# Patient Record
Sex: Female | Born: 1935 | Race: White | Hispanic: No | Smoking: Former smoker
Health system: Southern US, Community
[De-identification: ages and names within clinical notes are randomized; demographics above are authoritative.]

## PROBLEM LIST (undated history)

## (undated) DIAGNOSIS — J45909 Unspecified asthma, uncomplicated: Secondary | ICD-10-CM

## (undated) DIAGNOSIS — K229 Disease of esophagus, unspecified: Secondary | ICD-10-CM

## (undated) DIAGNOSIS — K5792 Diverticulitis of intestine, part unspecified, without perforation or abscess without bleeding: Secondary | ICD-10-CM

## (undated) DIAGNOSIS — K219 Gastro-esophageal reflux disease without esophagitis: Secondary | ICD-10-CM

## (undated) DIAGNOSIS — F333 Major depressive disorder, recurrent, severe with psychotic symptoms: Secondary | ICD-10-CM

## (undated) DIAGNOSIS — F419 Anxiety disorder, unspecified: Secondary | ICD-10-CM

## (undated) DIAGNOSIS — D649 Anemia, unspecified: Secondary | ICD-10-CM

## (undated) DIAGNOSIS — J449 Chronic obstructive pulmonary disease, unspecified: Secondary | ICD-10-CM

## (undated) DIAGNOSIS — E669 Obesity, unspecified: Secondary | ICD-10-CM

## (undated) DIAGNOSIS — I10 Essential (primary) hypertension: Secondary | ICD-10-CM

## (undated) DIAGNOSIS — Z8659 Personal history of other mental and behavioral disorders: Secondary | ICD-10-CM

## (undated) DIAGNOSIS — M1990 Unspecified osteoarthritis, unspecified site: Secondary | ICD-10-CM

## (undated) HISTORY — PX: KNEE SURGERY: SHX244

## (undated) HISTORY — PX: BACK SURGERY: SHX140

## (undated) HISTORY — DX: Personal history of other mental and behavioral disorders: Z86.59

## (undated) HISTORY — PX: INGUINAL HERNIA REPAIR: SHX194

## (undated) HISTORY — DX: Unspecified osteoarthritis, unspecified site: M19.90

## (undated) HISTORY — DX: Disease of esophagus, unspecified: K22.9

## (undated) HISTORY — DX: Major depressive disorder, recurrent, severe with psychotic symptoms: F33.3

## (undated) HISTORY — DX: Essential (primary) hypertension: I10

## (undated) HISTORY — PX: ABDOMINAL HYSTERECTOMY: SHX81

## (undated) HISTORY — DX: Gastro-esophageal reflux disease without esophagitis: K21.9

## (undated) HISTORY — PX: OTHER SURGICAL HISTORY: SHX169

## (undated) HISTORY — PX: NECK SURGERY: SHX720

## (undated) HISTORY — PX: CHOLECYSTECTOMY: SHX55

## (undated) HISTORY — DX: Unspecified asthma, uncomplicated: J45.909

## (undated) HISTORY — PX: APPENDECTOMY: SHX54

## (undated) HISTORY — DX: Obesity, unspecified: E66.9

## (undated) HISTORY — DX: Anemia, unspecified: D64.9

## (undated) HISTORY — DX: Anxiety disorder, unspecified: F41.9

## (undated) HISTORY — PX: CATARACT EXTRACTION: SUR2

---

## 2000-06-13 ENCOUNTER — Encounter: Payer: Self-pay | Admitting: Orthopedic Surgery

## 2000-06-13 ENCOUNTER — Ambulatory Visit (HOSPITAL_COMMUNITY): Admission: RE | Admit: 2000-06-13 | Discharge: 2000-06-13 | Payer: Self-pay | Admitting: Orthopedic Surgery

## 2000-10-07 ENCOUNTER — Encounter: Payer: Self-pay | Admitting: Orthopedic Surgery

## 2000-10-14 ENCOUNTER — Ambulatory Visit (HOSPITAL_COMMUNITY): Admission: RE | Admit: 2000-10-14 | Discharge: 2000-10-14 | Payer: Self-pay | Admitting: Orthopedic Surgery

## 2000-11-16 ENCOUNTER — Other Ambulatory Visit: Admission: RE | Admit: 2000-11-16 | Discharge: 2000-11-16 | Payer: Self-pay | Admitting: Family Medicine

## 2001-03-24 ENCOUNTER — Ambulatory Visit (HOSPITAL_COMMUNITY): Admission: RE | Admit: 2001-03-24 | Discharge: 2001-03-24 | Payer: Self-pay | Admitting: Family Medicine

## 2001-03-24 ENCOUNTER — Encounter: Payer: Self-pay | Admitting: Family Medicine

## 2001-08-02 ENCOUNTER — Ambulatory Visit (HOSPITAL_COMMUNITY): Admission: RE | Admit: 2001-08-02 | Discharge: 2001-08-02 | Payer: Self-pay | Admitting: General Surgery

## 2001-09-18 ENCOUNTER — Emergency Department (HOSPITAL_COMMUNITY): Admission: EM | Admit: 2001-09-18 | Discharge: 2001-09-19 | Payer: Self-pay

## 2001-11-06 ENCOUNTER — Inpatient Hospital Stay (HOSPITAL_COMMUNITY): Admission: RE | Admit: 2001-11-06 | Discharge: 2001-11-11 | Payer: Self-pay | Admitting: Orthopedic Surgery

## 2001-11-06 ENCOUNTER — Encounter: Payer: Self-pay | Admitting: Orthopedic Surgery

## 2002-03-13 ENCOUNTER — Ambulatory Visit (HOSPITAL_COMMUNITY): Admission: RE | Admit: 2002-03-13 | Discharge: 2002-03-13 | Payer: Self-pay | Admitting: Family Medicine

## 2002-03-21 ENCOUNTER — Encounter: Payer: Self-pay | Admitting: Family Medicine

## 2002-03-21 ENCOUNTER — Ambulatory Visit (HOSPITAL_COMMUNITY): Admission: RE | Admit: 2002-03-21 | Discharge: 2002-03-21 | Payer: Self-pay | Admitting: Family Medicine

## 2002-03-30 ENCOUNTER — Ambulatory Visit (HOSPITAL_COMMUNITY): Admission: RE | Admit: 2002-03-30 | Discharge: 2002-03-30 | Payer: Self-pay | Admitting: General Surgery

## 2002-04-15 ENCOUNTER — Emergency Department (HOSPITAL_COMMUNITY): Admission: EM | Admit: 2002-04-15 | Discharge: 2002-04-15 | Payer: Self-pay | Admitting: *Deleted

## 2002-07-02 ENCOUNTER — Ambulatory Visit (HOSPITAL_COMMUNITY): Admission: RE | Admit: 2002-07-02 | Discharge: 2002-07-02 | Payer: Self-pay | Admitting: Family Medicine

## 2002-07-02 ENCOUNTER — Encounter: Payer: Self-pay | Admitting: Family Medicine

## 2002-10-20 ENCOUNTER — Ambulatory Visit (HOSPITAL_COMMUNITY): Admission: RE | Admit: 2002-10-20 | Discharge: 2002-10-20 | Payer: Self-pay | Admitting: Family Medicine

## 2002-11-22 ENCOUNTER — Encounter
Admission: RE | Admit: 2002-11-22 | Discharge: 2003-02-20 | Payer: Self-pay | Admitting: Physical Medicine & Rehabilitation

## 2002-12-28 ENCOUNTER — Encounter: Payer: Self-pay | Admitting: Family Medicine

## 2002-12-28 ENCOUNTER — Ambulatory Visit (HOSPITAL_COMMUNITY): Admission: RE | Admit: 2002-12-28 | Discharge: 2002-12-28 | Payer: Self-pay | Admitting: Family Medicine

## 2003-01-21 ENCOUNTER — Encounter: Payer: Self-pay | Admitting: Family Medicine

## 2003-01-21 ENCOUNTER — Ambulatory Visit (HOSPITAL_COMMUNITY): Admission: RE | Admit: 2003-01-21 | Discharge: 2003-01-21 | Payer: Self-pay | Admitting: Family Medicine

## 2003-05-07 ENCOUNTER — Ambulatory Visit (HOSPITAL_COMMUNITY): Admission: RE | Admit: 2003-05-07 | Discharge: 2003-05-07 | Payer: Self-pay | Admitting: Family Medicine

## 2003-07-04 ENCOUNTER — Inpatient Hospital Stay (HOSPITAL_COMMUNITY): Admission: AD | Admit: 2003-07-04 | Discharge: 2003-07-07 | Payer: Self-pay | Admitting: Internal Medicine

## 2003-08-27 ENCOUNTER — Ambulatory Visit (HOSPITAL_COMMUNITY): Admission: RE | Admit: 2003-08-27 | Discharge: 2003-08-27 | Payer: Self-pay | Admitting: Family Medicine

## 2004-03-25 ENCOUNTER — Ambulatory Visit: Payer: Self-pay | Admitting: Family Medicine

## 2004-03-31 ENCOUNTER — Ambulatory Visit: Payer: Self-pay | Admitting: Family Medicine

## 2004-06-16 ENCOUNTER — Ambulatory Visit: Payer: Self-pay | Admitting: Family Medicine

## 2004-08-06 ENCOUNTER — Ambulatory Visit: Payer: Self-pay | Admitting: Family Medicine

## 2004-08-26 ENCOUNTER — Ambulatory Visit: Payer: Self-pay | Admitting: Family Medicine

## 2004-09-14 ENCOUNTER — Ambulatory Visit (HOSPITAL_COMMUNITY): Admission: RE | Admit: 2004-09-14 | Discharge: 2004-09-14 | Payer: Self-pay

## 2004-09-23 ENCOUNTER — Ambulatory Visit: Payer: Self-pay | Admitting: Family Medicine

## 2004-10-15 ENCOUNTER — Ambulatory Visit: Payer: Self-pay | Admitting: Family Medicine

## 2004-11-17 ENCOUNTER — Ambulatory Visit: Payer: Self-pay | Admitting: Family Medicine

## 2005-01-05 ENCOUNTER — Ambulatory Visit: Payer: Self-pay | Admitting: Family Medicine

## 2005-02-03 ENCOUNTER — Ambulatory Visit: Payer: Self-pay | Admitting: Family Medicine

## 2005-04-05 ENCOUNTER — Ambulatory Visit: Payer: Self-pay | Admitting: Family Medicine

## 2005-04-12 ENCOUNTER — Ambulatory Visit: Payer: Self-pay | Admitting: Orthopedic Surgery

## 2005-05-06 ENCOUNTER — Ambulatory Visit: Payer: Self-pay | Admitting: Family Medicine

## 2005-05-06 ENCOUNTER — Ambulatory Visit (HOSPITAL_COMMUNITY): Admission: RE | Admit: 2005-05-06 | Discharge: 2005-05-06 | Payer: Self-pay | Admitting: Family Medicine

## 2005-05-20 ENCOUNTER — Ambulatory Visit: Payer: Self-pay | Admitting: Family Medicine

## 2005-05-31 ENCOUNTER — Ambulatory Visit: Payer: Self-pay | Admitting: Orthopedic Surgery

## 2005-06-21 ENCOUNTER — Ambulatory Visit: Payer: Self-pay | Admitting: Family Medicine

## 2005-08-11 ENCOUNTER — Ambulatory Visit: Payer: Self-pay | Admitting: Family Medicine

## 2005-08-25 ENCOUNTER — Ambulatory Visit: Payer: Self-pay | Admitting: Family Medicine

## 2005-09-02 ENCOUNTER — Ambulatory Visit: Payer: Self-pay | Admitting: Family Medicine

## 2005-09-30 ENCOUNTER — Ambulatory Visit: Payer: Self-pay | Admitting: Orthopedic Surgery

## 2005-10-27 ENCOUNTER — Ambulatory Visit: Payer: Self-pay | Admitting: Family Medicine

## 2005-11-03 ENCOUNTER — Encounter: Payer: Self-pay | Admitting: Family Medicine

## 2005-11-03 LAB — CONVERTED CEMR LAB: Pap Smear: NORMAL

## 2005-11-04 ENCOUNTER — Ambulatory Visit: Payer: Self-pay | Admitting: Family Medicine

## 2005-12-13 ENCOUNTER — Ambulatory Visit: Payer: Self-pay | Admitting: Orthopedic Surgery

## 2005-12-29 ENCOUNTER — Ambulatory Visit: Payer: Self-pay | Admitting: Family Medicine

## 2006-02-10 ENCOUNTER — Ambulatory Visit: Payer: Self-pay | Admitting: Orthopedic Surgery

## 2006-02-15 ENCOUNTER — Ambulatory Visit: Payer: Self-pay | Admitting: Family Medicine

## 2006-03-15 ENCOUNTER — Ambulatory Visit: Payer: Self-pay | Admitting: Family Medicine

## 2006-03-18 ENCOUNTER — Encounter: Admission: RE | Admit: 2006-03-18 | Discharge: 2006-03-18 | Payer: Self-pay | Admitting: Sports Medicine

## 2006-04-05 ENCOUNTER — Other Ambulatory Visit: Admission: RE | Admit: 2006-04-05 | Discharge: 2006-04-05 | Payer: Self-pay | Admitting: Family Medicine

## 2006-04-05 ENCOUNTER — Ambulatory Visit: Payer: Self-pay | Admitting: Family Medicine

## 2006-04-05 ENCOUNTER — Encounter: Payer: Self-pay | Admitting: Family Medicine

## 2006-05-04 ENCOUNTER — Ambulatory Visit: Payer: Self-pay | Admitting: Family Medicine

## 2006-05-31 ENCOUNTER — Encounter: Admission: RE | Admit: 2006-05-31 | Discharge: 2006-05-31 | Payer: Self-pay | Admitting: Sports Medicine

## 2006-06-28 ENCOUNTER — Encounter: Payer: Self-pay | Admitting: Family Medicine

## 2006-06-28 LAB — CONVERTED CEMR LAB
ALT: 14 units/L (ref 0–35)
AST: 15 units/L (ref 0–37)
Albumin: 4 g/dL (ref 3.5–5.2)
CO2: 24 meq/L (ref 19–32)
Calcium: 9.4 mg/dL (ref 8.4–10.5)
Cholesterol: 184 mg/dL (ref 0–200)
HDL: 67 mg/dL (ref >39)
Potassium: 4.3 meq/L (ref 3.5–5.3)
Sodium: 147 meq/L — ABNORMAL HIGH (ref 135–145)
Total CHOL/HDL Ratio: 2.7
VLDL: 23 mg/dL (ref 0–40)

## 2006-07-14 ENCOUNTER — Ambulatory Visit: Payer: Self-pay | Admitting: Family Medicine

## 2006-08-23 ENCOUNTER — Encounter: Admission: RE | Admit: 2006-08-23 | Discharge: 2006-08-23 | Payer: Self-pay | Admitting: Sports Medicine

## 2006-09-04 ENCOUNTER — Emergency Department (HOSPITAL_COMMUNITY): Admission: EM | Admit: 2006-09-04 | Discharge: 2006-09-04 | Payer: Self-pay | Admitting: Emergency Medicine

## 2006-09-12 ENCOUNTER — Ambulatory Visit: Payer: Self-pay | Admitting: Family Medicine

## 2006-09-23 ENCOUNTER — Inpatient Hospital Stay (HOSPITAL_COMMUNITY): Admission: EM | Admit: 2006-09-23 | Discharge: 2006-09-24 | Payer: Self-pay | Admitting: Emergency Medicine

## 2006-09-27 ENCOUNTER — Ambulatory Visit: Payer: Self-pay | Admitting: Family Medicine

## 2006-10-04 ENCOUNTER — Ambulatory Visit: Payer: Self-pay | Admitting: Family Medicine

## 2006-11-15 ENCOUNTER — Encounter: Admission: RE | Admit: 2006-11-15 | Discharge: 2006-11-15 | Payer: Self-pay | Admitting: Sports Medicine

## 2006-11-17 ENCOUNTER — Ambulatory Visit: Payer: Self-pay | Admitting: Family Medicine

## 2006-12-20 ENCOUNTER — Ambulatory Visit (HOSPITAL_COMMUNITY): Admission: RE | Admit: 2006-12-20 | Discharge: 2006-12-20 | Payer: Self-pay | Admitting: General Surgery

## 2006-12-27 ENCOUNTER — Ambulatory Visit: Payer: Self-pay | Admitting: Family Medicine

## 2006-12-28 ENCOUNTER — Encounter: Payer: Self-pay | Admitting: Family Medicine

## 2007-01-12 ENCOUNTER — Ambulatory Visit (HOSPITAL_COMMUNITY): Admission: RE | Admit: 2007-01-12 | Discharge: 2007-01-12 | Payer: Self-pay | Admitting: Family Medicine

## 2007-01-12 LAB — HM MAMMOGRAPHY: HM Mammogram: NORMAL

## 2007-01-13 ENCOUNTER — Ambulatory Visit: Payer: Self-pay | Admitting: Family Medicine

## 2007-02-20 ENCOUNTER — Ambulatory Visit: Payer: Self-pay | Admitting: Family Medicine

## 2007-03-27 ENCOUNTER — Ambulatory Visit: Payer: Self-pay | Admitting: Family Medicine

## 2007-04-24 ENCOUNTER — Ambulatory Visit: Payer: Self-pay | Admitting: Family Medicine

## 2007-04-25 ENCOUNTER — Encounter: Payer: Self-pay | Admitting: Family Medicine

## 2007-05-06 ENCOUNTER — Encounter: Payer: Self-pay | Admitting: Family Medicine

## 2007-05-10 ENCOUNTER — Ambulatory Visit: Payer: Self-pay | Admitting: Family Medicine

## 2007-05-27 ENCOUNTER — Encounter: Payer: Self-pay | Admitting: Family Medicine

## 2007-05-27 LAB — CONVERTED CEMR LAB
AST: 13 units/L (ref 0–37)
Albumin: 4.1 g/dL (ref 3.5–5.2)
Basophils Absolute: 0 10*3/uL (ref 0.0–0.1)
CO2: 26 meq/L (ref 19–32)
Calcium: 9.1 mg/dL (ref 8.4–10.5)
Chloride: 107 meq/L (ref 96–112)
HDL: 57 mg/dL (ref >39)
LDL Cholesterol: 161 mg/dL — ABNORMAL HIGH (ref 0–99)
Lymphocytes Relative: 35 % (ref 12–46)
Lymphs Abs: 3.3 10*3/uL (ref 0.7–4.0)
Neutro Abs: 5.1 10*3/uL (ref 1.7–7.7)
Neutrophils Relative %: 55 % (ref 43–77)
Platelets: 368 10*3/uL (ref 150–400)
Potassium: 4.1 meq/L (ref 3.5–5.3)
RDW: 13.6 % (ref 11.5–15.5)
Sodium: 145 meq/L (ref 135–145)
TSH: 3.011 microintl units/mL (ref 0.350–5.50)
Total Bilirubin: 0.3 mg/dL (ref 0.3–1.2)
Total CHOL/HDL Ratio: 4.5
VLDL: 36 mg/dL (ref 0–40)
WBC: 9.3 10*3/uL (ref 4.0–10.5)

## 2007-06-26 ENCOUNTER — Ambulatory Visit: Payer: Self-pay | Admitting: Family Medicine

## 2007-07-05 ENCOUNTER — Emergency Department (HOSPITAL_COMMUNITY): Admission: EM | Admit: 2007-07-05 | Discharge: 2007-07-05 | Payer: Self-pay | Admitting: Emergency Medicine

## 2007-07-10 ENCOUNTER — Inpatient Hospital Stay (HOSPITAL_COMMUNITY): Admission: EM | Admit: 2007-07-10 | Discharge: 2007-07-19 | Payer: Self-pay | Admitting: Emergency Medicine

## 2007-07-13 ENCOUNTER — Encounter: Payer: Self-pay | Admitting: Orthopedic Surgery

## 2007-07-14 ENCOUNTER — Ambulatory Visit: Payer: Self-pay | Admitting: Physical Medicine & Rehabilitation

## 2007-07-19 ENCOUNTER — Inpatient Hospital Stay: Admission: AD | Admit: 2007-07-19 | Discharge: 2007-10-11 | Payer: Self-pay | Admitting: Family Medicine

## 2007-07-21 ENCOUNTER — Ambulatory Visit: Payer: Self-pay | Admitting: Family Medicine

## 2007-08-18 ENCOUNTER — Ambulatory Visit: Payer: Self-pay | Admitting: Family Medicine

## 2007-08-23 ENCOUNTER — Ambulatory Visit (HOSPITAL_COMMUNITY): Admission: RE | Admit: 2007-08-23 | Discharge: 2007-08-23 | Payer: Self-pay | Admitting: Family Medicine

## 2007-08-25 ENCOUNTER — Telehealth: Payer: Self-pay | Admitting: Family Medicine

## 2007-08-28 ENCOUNTER — Emergency Department (HOSPITAL_COMMUNITY): Admission: EM | Admit: 2007-08-28 | Discharge: 2007-08-28 | Payer: Self-pay | Admitting: Emergency Medicine

## 2007-08-28 ENCOUNTER — Telehealth: Payer: Self-pay | Admitting: Family Medicine

## 2007-08-30 ENCOUNTER — Ambulatory Visit (HOSPITAL_COMMUNITY): Admission: RE | Admit: 2007-08-30 | Discharge: 2007-08-30 | Payer: Self-pay | Admitting: Family Medicine

## 2007-08-31 ENCOUNTER — Emergency Department (HOSPITAL_COMMUNITY): Admission: EM | Admit: 2007-08-31 | Discharge: 2007-08-31 | Payer: Self-pay | Admitting: Emergency Medicine

## 2007-09-25 ENCOUNTER — Ambulatory Visit: Payer: Self-pay | Admitting: Family Medicine

## 2007-10-25 ENCOUNTER — Ambulatory Visit: Payer: Self-pay | Admitting: Family Medicine

## 2007-10-27 ENCOUNTER — Ambulatory Visit: Payer: Self-pay | Admitting: Family Medicine

## 2007-11-01 DIAGNOSIS — I10 Essential (primary) hypertension: Secondary | ICD-10-CM

## 2007-11-01 DIAGNOSIS — F411 Generalized anxiety disorder: Secondary | ICD-10-CM | POA: Insufficient documentation

## 2007-11-01 DIAGNOSIS — M1990 Unspecified osteoarthritis, unspecified site: Secondary | ICD-10-CM | POA: Insufficient documentation

## 2007-11-01 DIAGNOSIS — K219 Gastro-esophageal reflux disease without esophagitis: Secondary | ICD-10-CM | POA: Insufficient documentation

## 2007-11-01 DIAGNOSIS — E669 Obesity, unspecified: Secondary | ICD-10-CM

## 2007-11-01 DIAGNOSIS — J45909 Unspecified asthma, uncomplicated: Secondary | ICD-10-CM | POA: Insufficient documentation

## 2007-12-04 ENCOUNTER — Ambulatory Visit: Payer: Self-pay | Admitting: Family Medicine

## 2007-12-05 ENCOUNTER — Encounter: Payer: Self-pay | Admitting: Family Medicine

## 2008-01-01 ENCOUNTER — Encounter: Payer: Self-pay | Admitting: Family Medicine

## 2008-01-03 ENCOUNTER — Encounter: Payer: Self-pay | Admitting: Family Medicine

## 2008-01-08 ENCOUNTER — Ambulatory Visit: Payer: Self-pay | Admitting: Family Medicine

## 2008-01-17 ENCOUNTER — Encounter: Payer: Self-pay | Admitting: Family Medicine

## 2008-01-17 ENCOUNTER — Telehealth: Payer: Self-pay | Admitting: Family Medicine

## 2008-01-25 ENCOUNTER — Ambulatory Visit: Payer: Self-pay | Admitting: Family Medicine

## 2008-01-25 LAB — CONVERTED CEMR LAB
Glucose, Urine, Semiquant: NEGATIVE
Ketones, urine, test strip: NEGATIVE
Nitrite: NEGATIVE
Specific Gravity, Urine: 1.01

## 2008-01-26 ENCOUNTER — Encounter: Payer: Self-pay | Admitting: Family Medicine

## 2008-01-26 LAB — CONVERTED CEMR LAB
Basophils Absolute: 0 10*3/uL (ref 0.0–0.1)
Basophils Relative: 0 % (ref 0–1)
Chloride: 102 meq/L (ref 96–112)
MCHC: 30.9 g/dL (ref 30.0–36.0)
Monocytes Relative: 4 % (ref 3–12)
Neutro Abs: 7.5 10*3/uL (ref 1.7–7.7)
Neutrophils Relative %: 82 % — ABNORMAL HIGH (ref 43–77)
Potassium: 5.1 meq/L (ref 3.5–5.3)
RBC: 4.39 M/uL (ref 3.87–5.11)
RDW: 19.1 % — ABNORMAL HIGH (ref 11.5–15.5)

## 2008-01-29 DIAGNOSIS — B379 Candidiasis, unspecified: Secondary | ICD-10-CM | POA: Insufficient documentation

## 2008-01-31 ENCOUNTER — Telehealth: Payer: Self-pay | Admitting: Family Medicine

## 2008-02-01 ENCOUNTER — Telehealth: Payer: Self-pay | Admitting: Family Medicine

## 2008-02-13 ENCOUNTER — Emergency Department (HOSPITAL_COMMUNITY): Admission: EM | Admit: 2008-02-13 | Discharge: 2008-02-13 | Payer: Self-pay | Admitting: Emergency Medicine

## 2008-02-13 ENCOUNTER — Telehealth: Payer: Self-pay | Admitting: Family Medicine

## 2008-02-23 ENCOUNTER — Encounter: Payer: Self-pay | Admitting: Family Medicine

## 2008-02-29 ENCOUNTER — Encounter: Payer: Self-pay | Admitting: Family Medicine

## 2008-03-04 ENCOUNTER — Encounter: Payer: Self-pay | Admitting: Family Medicine

## 2008-03-20 ENCOUNTER — Ambulatory Visit: Payer: Self-pay | Admitting: Family Medicine

## 2008-03-20 LAB — CONVERTED CEMR LAB
Bilirubin Urine: NEGATIVE
Blood in Urine, dipstick: NEGATIVE
Glucose, Urine, Semiquant: NEGATIVE
Protein, U semiquant: NEGATIVE
pH: 7.5

## 2008-03-21 ENCOUNTER — Encounter: Payer: Self-pay | Admitting: Family Medicine

## 2008-03-22 ENCOUNTER — Telehealth: Payer: Self-pay | Admitting: Family Medicine

## 2008-03-22 ENCOUNTER — Encounter: Payer: Self-pay | Admitting: Family Medicine

## 2008-03-22 ENCOUNTER — Ambulatory Visit (HOSPITAL_COMMUNITY): Payer: Self-pay | Admitting: Psychology

## 2008-03-22 LAB — CONVERTED CEMR LAB
CO2: 22 meq/L (ref 19–32)
Calcium: 9.6 mg/dL (ref 8.4–10.5)
Chloride: 107 meq/L (ref 96–112)
Eosinophils Relative: 0 % (ref 0–5)
Glucose, Bld: 132 mg/dL — ABNORMAL HIGH (ref 70–99)
HCT: 40.3 % (ref 36.0–46.0)
Hemoglobin: 12.6 g/dL (ref 12.0–15.0)
Lymphocytes Relative: 15 % (ref 12–46)
Lymphs Abs: 1.4 10*3/uL (ref 0.7–4.0)
Monocytes Absolute: 0.2 10*3/uL (ref 0.1–1.0)
Platelets: 340 10*3/uL (ref 150–400)
Sodium: 145 meq/L (ref 135–145)
WBC: 9.2 10*3/uL (ref 4.0–10.5)

## 2008-04-01 ENCOUNTER — Telehealth: Payer: Self-pay | Admitting: Family Medicine

## 2008-04-05 ENCOUNTER — Emergency Department (HOSPITAL_COMMUNITY): Admission: EM | Admit: 2008-04-05 | Discharge: 2008-04-05 | Payer: Self-pay | Admitting: Emergency Medicine

## 2008-04-11 ENCOUNTER — Ambulatory Visit (HOSPITAL_COMMUNITY): Payer: Self-pay | Admitting: Psychiatry

## 2008-04-12 ENCOUNTER — Telehealth: Payer: Self-pay | Admitting: Family Medicine

## 2008-04-15 ENCOUNTER — Telehealth: Payer: Self-pay | Admitting: Family Medicine

## 2008-04-16 ENCOUNTER — Encounter: Payer: Self-pay | Admitting: Family Medicine

## 2008-04-23 ENCOUNTER — Ambulatory Visit: Payer: Self-pay | Admitting: Family Medicine

## 2008-04-27 ENCOUNTER — Encounter: Payer: Self-pay | Admitting: Family Medicine

## 2008-05-30 ENCOUNTER — Encounter: Payer: Self-pay | Admitting: Family Medicine

## 2008-07-02 ENCOUNTER — Telehealth: Payer: Self-pay | Admitting: Family Medicine

## 2008-07-03 ENCOUNTER — Encounter: Payer: Self-pay | Admitting: Family Medicine

## 2008-08-26 ENCOUNTER — Ambulatory Visit: Payer: Self-pay | Admitting: Family Medicine

## 2008-08-26 LAB — CONVERTED CEMR LAB
Bilirubin Urine: NEGATIVE
Blood in Urine, dipstick: NEGATIVE
Protein, U semiquant: NEGATIVE
Urobilinogen, UA: 0.2

## 2008-08-27 ENCOUNTER — Encounter: Payer: Self-pay | Admitting: Family Medicine

## 2008-08-28 ENCOUNTER — Telehealth: Payer: Self-pay | Admitting: Family Medicine

## 2008-08-29 ENCOUNTER — Telehealth: Payer: Self-pay | Admitting: Family Medicine

## 2008-08-29 ENCOUNTER — Encounter: Payer: Self-pay | Admitting: Family Medicine

## 2008-09-10 ENCOUNTER — Telehealth: Payer: Self-pay | Admitting: Family Medicine

## 2008-09-11 ENCOUNTER — Encounter: Payer: Self-pay | Admitting: Family Medicine

## 2008-09-13 LAB — CONVERTED CEMR LAB
Albumin: 4 g/dL (ref 3.5–5.2)
Bilirubin, Direct: 0.1 mg/dL (ref 0.0–0.3)
CO2: 28 meq/L (ref 19–32)
Calcium: 9.4 mg/dL (ref 8.4–10.5)
Chloride: 106 meq/L (ref 96–112)
Glucose, Bld: 90 mg/dL (ref 70–99)
HDL: 48 mg/dL (ref >39)
LDL Cholesterol: 123 mg/dL — ABNORMAL HIGH (ref 0–99)
Potassium: 4.6 meq/L (ref 3.5–5.3)
Sodium: 145 meq/L (ref 135–145)
Total Bilirubin: 0.3 mg/dL (ref 0.3–1.2)
Total CHOL/HDL Ratio: 4.5
VLDL: 46 mg/dL — ABNORMAL HIGH (ref 0–40)

## 2008-09-19 ENCOUNTER — Telehealth: Payer: Self-pay | Admitting: Family Medicine

## 2008-10-08 ENCOUNTER — Telehealth: Payer: Self-pay | Admitting: Family Medicine

## 2008-10-09 ENCOUNTER — Ambulatory Visit: Payer: Self-pay | Admitting: Family Medicine

## 2008-11-06 ENCOUNTER — Ambulatory Visit: Payer: Self-pay | Admitting: Family Medicine

## 2008-12-16 ENCOUNTER — Telehealth: Payer: Self-pay | Admitting: Family Medicine

## 2008-12-25 ENCOUNTER — Ambulatory Visit: Payer: Self-pay | Admitting: Family Medicine

## 2009-01-16 LAB — CONVERTED CEMR LAB
ALT: 16 units/L (ref 0–35)
AST: 19 units/L (ref 0–37)
Albumin: 3.9 g/dL (ref 3.5–5.2)
Bilirubin, Direct: <0.1 mg/dL (ref 0.0–0.3)
CO2: 30 meq/L (ref 19–32)
Glucose, Bld: 88 mg/dL (ref 70–99)
HDL: 48 mg/dL (ref >39)
Potassium: 5 meq/L (ref 3.5–5.3)
Sodium: 141 meq/L (ref 135–145)
Total Bilirubin: 0.2 mg/dL — ABNORMAL LOW (ref 0.3–1.2)
Total CHOL/HDL Ratio: 5.3
VLDL: 42 mg/dL — ABNORMAL HIGH (ref 0–40)

## 2009-01-20 ENCOUNTER — Ambulatory Visit: Payer: Self-pay | Admitting: Family Medicine

## 2009-01-31 ENCOUNTER — Encounter: Payer: Self-pay | Admitting: Family Medicine

## 2009-02-03 ENCOUNTER — Telehealth: Payer: Self-pay | Admitting: Family Medicine

## 2009-02-04 ENCOUNTER — Ambulatory Visit: Payer: Self-pay | Admitting: Family Medicine

## 2009-02-20 ENCOUNTER — Encounter: Payer: Self-pay | Admitting: Family Medicine

## 2009-03-12 ENCOUNTER — Ambulatory Visit: Payer: Self-pay | Admitting: Family Medicine

## 2009-03-12 ENCOUNTER — Telehealth: Payer: Self-pay | Admitting: Family Medicine

## 2009-05-06 ENCOUNTER — Telehealth: Payer: Self-pay | Admitting: Family Medicine

## 2009-05-12 ENCOUNTER — Ambulatory Visit: Payer: Self-pay | Admitting: Family Medicine

## 2009-05-26 ENCOUNTER — Telehealth: Payer: Self-pay | Admitting: Family Medicine

## 2009-05-26 HISTORY — PX: CATARACT EXTRACTION: SUR2

## 2009-05-27 ENCOUNTER — Telehealth: Payer: Self-pay | Admitting: Family Medicine

## 2009-06-05 ENCOUNTER — Ambulatory Visit: Payer: Self-pay | Admitting: Family Medicine

## 2009-06-10 ENCOUNTER — Telehealth: Payer: Self-pay | Admitting: Family Medicine

## 2009-06-10 ENCOUNTER — Ambulatory Visit: Payer: Self-pay | Admitting: Family Medicine

## 2009-06-11 DIAGNOSIS — B356 Tinea cruris: Secondary | ICD-10-CM

## 2009-06-11 DIAGNOSIS — L290 Pruritus ani: Secondary | ICD-10-CM

## 2009-06-13 ENCOUNTER — Telehealth: Payer: Self-pay | Admitting: Family Medicine

## 2009-06-18 ENCOUNTER — Encounter: Payer: Self-pay | Admitting: Orthopedic Surgery

## 2009-06-18 ENCOUNTER — Emergency Department (HOSPITAL_COMMUNITY): Admission: EM | Admit: 2009-06-18 | Discharge: 2009-06-19 | Payer: Self-pay | Admitting: Emergency Medicine

## 2009-06-19 ENCOUNTER — Ambulatory Visit: Payer: Self-pay | Admitting: Orthopedic Surgery

## 2009-06-19 DIAGNOSIS — S52509A Unspecified fracture of the lower end of unspecified radius, initial encounter for closed fracture: Secondary | ICD-10-CM | POA: Insufficient documentation

## 2009-06-19 DIAGNOSIS — S52609A Unspecified fracture of lower end of unspecified ulna, initial encounter for closed fracture: Secondary | ICD-10-CM

## 2009-06-23 ENCOUNTER — Telehealth: Payer: Self-pay | Admitting: Family Medicine

## 2009-06-23 HISTORY — PX: OTHER SURGICAL HISTORY: SHX169

## 2009-06-24 ENCOUNTER — Encounter (INDEPENDENT_AMBULATORY_CARE_PROVIDER_SITE_OTHER): Payer: Self-pay | Admitting: *Deleted

## 2009-06-26 ENCOUNTER — Ambulatory Visit: Payer: Self-pay | Admitting: Orthopedic Surgery

## 2009-06-26 ENCOUNTER — Ambulatory Visit (HOSPITAL_COMMUNITY): Admission: RE | Admit: 2009-06-26 | Discharge: 2009-06-26 | Payer: Self-pay | Admitting: Orthopedic Surgery

## 2009-06-30 ENCOUNTER — Ambulatory Visit: Payer: Self-pay | Admitting: Orthopedic Surgery

## 2009-07-03 ENCOUNTER — Ambulatory Visit: Payer: Self-pay | Admitting: Family Medicine

## 2009-07-03 DIAGNOSIS — F068 Other specified mental disorders due to known physiological condition: Secondary | ICD-10-CM

## 2009-07-04 ENCOUNTER — Telehealth: Payer: Self-pay | Admitting: Family Medicine

## 2009-07-07 ENCOUNTER — Ambulatory Visit: Payer: Self-pay | Admitting: Orthopedic Surgery

## 2009-07-08 ENCOUNTER — Encounter (HOSPITAL_COMMUNITY): Admission: RE | Admit: 2009-07-08 | Discharge: 2009-08-07 | Payer: Self-pay | Admitting: Orthopedic Surgery

## 2009-07-08 ENCOUNTER — Encounter: Payer: Self-pay | Admitting: Orthopedic Surgery

## 2009-07-14 ENCOUNTER — Ambulatory Visit: Payer: Self-pay | Admitting: Orthopedic Surgery

## 2009-07-15 ENCOUNTER — Telehealth: Payer: Self-pay | Admitting: Family Medicine

## 2009-07-18 ENCOUNTER — Telehealth: Payer: Self-pay | Admitting: Family Medicine

## 2009-07-23 ENCOUNTER — Ambulatory Visit: Payer: Self-pay | Admitting: Orthopedic Surgery

## 2009-07-24 ENCOUNTER — Telehealth: Payer: Self-pay | Admitting: Physician Assistant

## 2009-07-28 ENCOUNTER — Encounter: Payer: Self-pay | Admitting: Family Medicine

## 2009-08-06 ENCOUNTER — Ambulatory Visit: Payer: Self-pay | Admitting: Orthopedic Surgery

## 2009-08-12 ENCOUNTER — Ambulatory Visit: Payer: Self-pay | Admitting: Family Medicine

## 2009-08-12 DIAGNOSIS — L0291 Cutaneous abscess, unspecified: Secondary | ICD-10-CM

## 2009-08-12 DIAGNOSIS — L0390 Cellulitis, unspecified: Secondary | ICD-10-CM

## 2009-08-19 ENCOUNTER — Emergency Department (HOSPITAL_COMMUNITY): Admission: EM | Admit: 2009-08-19 | Discharge: 2009-08-19 | Payer: Self-pay | Admitting: Emergency Medicine

## 2009-08-21 ENCOUNTER — Ambulatory Visit: Payer: Self-pay | Admitting: Family Medicine

## 2009-08-22 ENCOUNTER — Encounter: Payer: Self-pay | Admitting: Family Medicine

## 2009-08-22 ENCOUNTER — Emergency Department (HOSPITAL_COMMUNITY): Admission: EM | Admit: 2009-08-22 | Discharge: 2009-08-22 | Payer: Self-pay | Admitting: Emergency Medicine

## 2009-08-22 ENCOUNTER — Telehealth: Payer: Self-pay | Admitting: Family Medicine

## 2009-08-25 ENCOUNTER — Telehealth: Payer: Self-pay | Admitting: Family Medicine

## 2009-08-26 ENCOUNTER — Telehealth: Payer: Self-pay | Admitting: Family Medicine

## 2009-08-29 ENCOUNTER — Ambulatory Visit: Payer: Self-pay | Admitting: Family Medicine

## 2009-09-08 ENCOUNTER — Telehealth: Payer: Self-pay | Admitting: Family Medicine

## 2009-09-17 ENCOUNTER — Ambulatory Visit: Payer: Self-pay | Admitting: Orthopedic Surgery

## 2009-09-17 ENCOUNTER — Ambulatory Visit: Payer: Self-pay | Admitting: Family Medicine

## 2009-09-17 DIAGNOSIS — M25819 Other specified joint disorders, unspecified shoulder: Secondary | ICD-10-CM | POA: Insufficient documentation

## 2009-09-17 DIAGNOSIS — M7580 Other shoulder lesions, unspecified shoulder: Secondary | ICD-10-CM

## 2009-09-23 ENCOUNTER — Telehealth: Payer: Self-pay | Admitting: Family Medicine

## 2009-10-08 ENCOUNTER — Telehealth: Payer: Self-pay | Admitting: Family Medicine

## 2009-10-16 ENCOUNTER — Ambulatory Visit: Payer: Self-pay | Admitting: Family Medicine

## 2009-11-13 ENCOUNTER — Telehealth: Payer: Self-pay | Admitting: Family Medicine

## 2009-11-20 ENCOUNTER — Ambulatory Visit: Payer: Self-pay | Admitting: Family Medicine

## 2009-11-20 ENCOUNTER — Ambulatory Visit (HOSPITAL_COMMUNITY): Admission: RE | Admit: 2009-11-20 | Discharge: 2009-11-20 | Payer: Self-pay | Admitting: Family Medicine

## 2009-11-20 DIAGNOSIS — R5383 Other fatigue: Secondary | ICD-10-CM

## 2009-11-20 DIAGNOSIS — R5381 Other malaise: Secondary | ICD-10-CM | POA: Insufficient documentation

## 2009-11-20 DIAGNOSIS — N3000 Acute cystitis without hematuria: Secondary | ICD-10-CM | POA: Insufficient documentation

## 2009-11-20 LAB — CONVERTED CEMR LAB
Bilirubin Urine: NEGATIVE
Blood in Urine, dipstick: NEGATIVE
Glucose, Urine, Semiquant: NEGATIVE
Protein, U semiquant: NEGATIVE
pH: 5

## 2009-11-21 HISTORY — PX: OTHER SURGICAL HISTORY: SHX169

## 2009-11-21 LAB — CONVERTED CEMR LAB
ALT: 9 units/L (ref 0–35)
Albumin: 4 g/dL (ref 3.5–5.2)
Basophils Absolute: 0 10*3/uL (ref 0.0–0.1)
Chloride: 99 meq/L (ref 96–112)
Cholesterol: 216 mg/dL — ABNORMAL HIGH (ref 0–200)
Eosinophils Relative: 2 % (ref 0–5)
HCT: 38.9 % (ref 36.0–46.0)
HDL: 48 mg/dL (ref >39)
Indirect Bilirubin: 0.2 mg/dL (ref 0.0–0.9)
LDL Cholesterol: 137 mg/dL — ABNORMAL HIGH (ref 0–99)
Lymphocytes Relative: 32 % (ref 12–46)
Lymphs Abs: 3.1 10*3/uL (ref 0.7–4.0)
Neutro Abs: 5.7 10*3/uL (ref 1.7–7.7)
Platelets: 304 10*3/uL (ref 150–400)
Potassium: 4.3 meq/L (ref 3.5–5.3)
RDW: 13.6 % (ref 11.5–15.5)
Sodium: 140 meq/L (ref 135–145)
TSH: 2.051 microintl units/mL (ref 0.350–4.500)
Total CHOL/HDL Ratio: 4.5
Total Protein: 6.9 g/dL (ref 6.0–8.3)
Triglycerides: 153 mg/dL — ABNORMAL HIGH (ref <150)
VLDL: 31 mg/dL (ref 0–40)
WBC: 9.6 10*3/uL (ref 4.0–10.5)

## 2009-11-22 ENCOUNTER — Encounter: Payer: Self-pay | Admitting: Family Medicine

## 2009-11-24 DIAGNOSIS — E785 Hyperlipidemia, unspecified: Secondary | ICD-10-CM

## 2009-11-25 ENCOUNTER — Encounter: Payer: Self-pay | Admitting: Family Medicine

## 2009-11-26 ENCOUNTER — Telehealth: Payer: Self-pay | Admitting: Family Medicine

## 2009-12-05 ENCOUNTER — Ambulatory Visit (HOSPITAL_COMMUNITY): Admission: RE | Admit: 2009-12-05 | Discharge: 2009-12-06 | Payer: Self-pay | Admitting: Orthopedic Surgery

## 2009-12-06 ENCOUNTER — Encounter: Payer: Self-pay | Admitting: Family Medicine

## 2009-12-10 ENCOUNTER — Telehealth: Payer: Self-pay | Admitting: Family Medicine

## 2009-12-18 ENCOUNTER — Telehealth: Payer: Self-pay | Admitting: Family Medicine

## 2010-01-02 ENCOUNTER — Ambulatory Visit: Payer: Self-pay | Admitting: Family Medicine

## 2010-01-22 ENCOUNTER — Telehealth: Payer: Self-pay | Admitting: Family Medicine

## 2010-02-11 ENCOUNTER — Ambulatory Visit: Payer: Self-pay | Admitting: Family Medicine

## 2010-02-24 ENCOUNTER — Telehealth: Payer: Self-pay | Admitting: Family Medicine

## 2010-02-25 ENCOUNTER — Encounter: Payer: Self-pay | Admitting: Family Medicine

## 2010-03-02 ENCOUNTER — Telehealth: Payer: Self-pay | Admitting: Family Medicine

## 2010-03-09 ENCOUNTER — Ambulatory Visit: Payer: Self-pay | Admitting: Family Medicine

## 2010-03-09 DIAGNOSIS — J209 Acute bronchitis, unspecified: Secondary | ICD-10-CM

## 2010-03-10 ENCOUNTER — Telehealth: Payer: Self-pay | Admitting: Family Medicine

## 2010-03-12 ENCOUNTER — Ambulatory Visit: Payer: Self-pay | Admitting: Family Medicine

## 2010-03-12 DIAGNOSIS — R609 Edema, unspecified: Secondary | ICD-10-CM | POA: Insufficient documentation

## 2010-03-16 ENCOUNTER — Ambulatory Visit: Payer: Self-pay | Admitting: Family Medicine

## 2010-03-16 DIAGNOSIS — M25559 Pain in unspecified hip: Secondary | ICD-10-CM | POA: Insufficient documentation

## 2010-03-16 DIAGNOSIS — M5416 Radiculopathy, lumbar region: Secondary | ICD-10-CM

## 2010-03-24 ENCOUNTER — Encounter: Payer: Self-pay | Admitting: Orthopedic Surgery

## 2010-03-26 ENCOUNTER — Encounter: Payer: Self-pay | Admitting: Family Medicine

## 2010-03-31 ENCOUNTER — Telehealth: Payer: Self-pay | Admitting: Family Medicine

## 2010-03-31 ENCOUNTER — Emergency Department (HOSPITAL_COMMUNITY): Admission: EM | Admit: 2010-03-31 | Discharge: 2010-03-31 | Payer: Self-pay | Admitting: Emergency Medicine

## 2010-04-02 ENCOUNTER — Encounter: Payer: Self-pay | Admitting: Family Medicine

## 2010-04-06 ENCOUNTER — Encounter: Payer: Self-pay | Admitting: Family Medicine

## 2010-04-14 ENCOUNTER — Telehealth: Payer: Self-pay | Admitting: Family Medicine

## 2010-04-20 ENCOUNTER — Telehealth: Payer: Self-pay | Admitting: Family Medicine

## 2010-04-20 ENCOUNTER — Encounter: Payer: Self-pay | Admitting: Family Medicine

## 2010-05-12 ENCOUNTER — Ambulatory Visit: Payer: Self-pay | Admitting: Family Medicine

## 2010-05-19 ENCOUNTER — Ambulatory Visit: Payer: Self-pay | Admitting: Family Medicine

## 2010-06-01 ENCOUNTER — Encounter
Admission: RE | Admit: 2010-06-01 | Discharge: 2010-06-01 | Payer: Self-pay | Source: Home / Self Care | Attending: Sports Medicine | Admitting: Sports Medicine

## 2010-06-01 ENCOUNTER — Encounter: Payer: Self-pay | Admitting: Family Medicine

## 2010-06-02 ENCOUNTER — Encounter: Payer: Self-pay | Admitting: Family Medicine

## 2010-06-02 ENCOUNTER — Ambulatory Visit
Admission: RE | Admit: 2010-06-02 | Discharge: 2010-06-02 | Payer: Self-pay | Source: Home / Self Care | Attending: Family Medicine | Admitting: Family Medicine

## 2010-06-02 DIAGNOSIS — M25569 Pain in unspecified knee: Secondary | ICD-10-CM | POA: Insufficient documentation

## 2010-06-03 ENCOUNTER — Encounter: Payer: Self-pay | Admitting: Family Medicine

## 2010-06-08 ENCOUNTER — Telehealth: Payer: Self-pay | Admitting: Family Medicine

## 2010-06-08 ENCOUNTER — Encounter: Payer: Self-pay | Admitting: Family Medicine

## 2010-06-13 ENCOUNTER — Encounter: Payer: Self-pay | Admitting: Family Medicine

## 2010-06-14 ENCOUNTER — Encounter: Payer: Self-pay | Admitting: Family Medicine

## 2010-06-18 ENCOUNTER — Telehealth: Payer: Self-pay | Admitting: Family Medicine

## 2010-06-23 NOTE — Progress Notes (Signed)
Summary: cream for bottom  Phone Note Call from Patient   Summary of Call: pt has a place on bottom and needs some cream. [facility] [phone] Initial call taken by: Rudene Anda,  March 02, 2010 2:08 PM  Follow-up for Phone Call        pls ask if it itches, see ifshe knows the name of the cream, may be clotrimazole, this is on her med list, if that is what she is requesting refill x 2 pls Follow-up by: Syliva Overman MD,  March 03, 2010 12:50 PM  Additional Follow-up for Phone Call Additional follow up Details #1::        The place does not itch, just a little sore, aware sending in refill on clotrimazole Additional Follow-up by: Everitt Amber LPN,  March 03, 2010 4:45 PM    Prescriptions: CLOTRIMAZOLE 1 % CREA (CLOTRIMAZOLE) apply two times a day as needed  #36month x 0   Entered by:   Everitt Amber LPN   Authorized by:   Syliva Overman MD   Signed by:   Everitt Amber LPN on 35/57/3220   Method used:   [facility]        [facility]  [place] [address]       [address]       [place], [place]  [address]       Ph: [phone] or [phone]       Fax: [phone]   RxID:   [id number]

## 2010-06-23 NOTE — Assessment & Plan Note (Signed)
Summary: 1 WK WOUND RE-CK/RT WRIST/POST OP 06/26/09/[facility] MED/[facility]   Visit Type:  Follow-up Primary Provider:  Kerri Perches, MD  CC:  post op.  History of Present Illness: DOS 06/26/09 right wrist.  POD 27.  Procedure ORIF right wrist.  Recheck after taking Bactrim, wound check.  Finished Bactrim today.  Her arm is doing well.  She fell a week ago after she had a change in her medicine, did not injure surgical arm.  Her wound continues to improve.  Still has some redness around the wrist.  Recommend continued Bactrim follow 2 weeks x-rays of the RIGHT wrist including lunate fossa view    Allergies: 1)  ! Pcn 2)  ! Codeine 3)  ! * Duragesic 4)  ! Levaquin 5)  ! Morphine 6)  ! Prednisone 7)  ! * Ciprofloxacin 8)  ! Prednisone   Impression & Recommendations:  Problem # 1:  AFTERCARE HEALING TRAUMATIC FRACTURE UPPER ARM (ICD-V54.11) Assessment Comment Only  Orders: Post-Op Check (27062)  Problem # 2:  CLOSED FRACTURE OF LOWER END OF RADIUS WITH ULNA (ICD-813.44) Assessment: Comment Only  Orders: Post-Op Check (37628)  Medications Added to Medication List This Visit: 1)  Bactrim Ds 800-160 Mg Tabs (Sulfamethoxazole-trimethoprim) .Marland Kitchen.. 1 by mouth two times a day  Patient Instructions: 1)  Please schedule a follow-up appointment in 2 weeks. 2)  xrays right wrist include lunate fossa view  Prescriptions: BACTRIM DS 800-160 MG TABS (SULFAMETHOXAZOLE-TRIMETHOPRIM) 1 by mouth two times a day  #28 x 2   Entered and Authorized by:   Fuller Canada MD   Signed by:   Fuller Canada MD on 07/23/2009   Method used:   Print then Give to Patient   RxID:   [id number]

## 2010-06-23 NOTE — Progress Notes (Signed)
Summary: spitting up blood  Phone Note Call from Patient   Summary of Call: when pt coughs she is spitting up blood. please call [phone] Initial call taken by: Rudene Anda,  March 10, 2010 10:57 AM  Follow-up for Phone Call        patient states she bit her lip yesterday and that is where blood was coming from Follow-up by: Adella Hare LPN,  March 10, 2010 2:22 PM

## 2010-06-23 NOTE — Miscellaneous (Signed)
Summary: OT clinical evaluation  OT clinical evaluation   Imported By: Jacklynn Ganong 07/28/2009 10:31:06  _____________________________________________________________________  External Attachment:    Type:   Image     Comment:   External Document

## 2010-06-23 NOTE — Assessment & Plan Note (Signed)
Summary: 1 WK RE-CK/REM STAPLES/XRAYS INCL LUN FOSSA VIEW/POST OP 2/3/...   Visit Type:  Follow-up Primary Provider:  Kerri Perches, MD  CC:  post op right wrist..  History of Present Illness: DOS 06/26/09 right wrist.  Procedure ORIF right wrist.  f/u xrays and suture out   incision: surrounding erythema of the incision; with one area of skin breakdown distally;  xrays 3 views  * hardware is intact and in good position;  * fracture reduction is excellent   IMPR: stable fixation; good reduction   f/u in 1 week to check wound  start ATBX    Allergies: 1)  ! Pcn 2)  ! Codeine 3)  ! * Duragesic 4)  ! Levaquin 5)  ! Morphine 6)  ! Prednisone 7)  ! * Ciprofloxacin 8)  ! Prednisone   Impression & Recommendations:  Problem # 1:  CLOSED FRACTURE OF LOWER END OF RADIUS WITH ULNA (ICD-813.44)  Orders: Physical Therapy Referral (PT) Post-Op Check (04540) Wrist x-ray complete, minimum 3 views (73110)  Problem # 2:  AFTERCARE HEALING TRAUMATIC FRACTURE UPPER ARM (ICD-V54.11)  Orders: Post-Op Check (98119) Wrist x-ray complete, minimum 3 views (14782)  Patient Instructions: 1)  1 weeks wound check  2)  take bactrim 1 two times a day

## 2010-06-23 NOTE — Letter (Signed)
Summary: Letter  Letter   Imported By: Lind Guest 11/21/2009 14:40:59  _____________________________________________________________________  External Attachment:    Type:   Image     Comment:   External Document

## 2010-06-23 NOTE — Assessment & Plan Note (Signed)
Summary: clearance for surgery   Vital Signs:  Patient profile:   76 year old female Menstrual status:  hysterectomy Height:      63 inches Weight:      227.25 pounds BMI:     40.40 O2 Sat:      90 % Pulse rate:   87 / minute Pulse rhythm:   regular Resp:     16 per minute BP sitting:   128 / 84 Cuff size:   large  Vitals Entered By: Everitt Amber LPN (November 20, 2009 1:22 PM)  Nutrition Counseling: Patient's BMI is greater than 25 and therefore counseled on weight management options. CC: Surgical clearance   Primary Care Provider:  Kerri Perches, MD  CC:  Surgical clearance.  History of Present Illness: [name] isin today for clearancxe for surgery. Since I las saw her , she reports that her mental health has improved tremendously, despite no psychiatric intervention. She has clear recall of notrecognzing her spouse and abnormal behavior on herpart.She currently denies suicidal or homicidal ideation, adnshe denies hallucinations. She is to have the hardware in her knee removed, due to uncontrolled pain and is anxiously awaiting this. She denies any recent fever or chills. She denies head or chest congestion, shortness of breath or wheezing. She denies dysuria, but does have frequency and incontinence issues . Her apetite is good, sh denies abdominal pain, nausea, vomitting , diarreah or constipation. She reports chtonic back , knee and hip pain with swelling , deformity and reduced mobility. She ambulates with a walker.   Current Medications (verified): 1)  Hydrocodone-Acetaminophen 7.5-750 Mg  Tabs (Hydrocodone-Acetaminophen) .... Take One Tablet Every 6-8 Hours 2)  Symbicort 80-4.5 Mcg/act Aero (Budesonide-Formoterol Fumarate) .... 2 Puffs Twice Daily 3)  Alprazolam 0.5 Mg Tbdp (Alprazolam) .Marland Kitchen.. 1 in The Morning and 2 At Bedtime 4)  Symbicort 80-4.5 Mcg/act Aero (Budesonide-Formoterol Fumarate) .... 2 Pufs Twice Daily 5)  Proventil Hfa 108 (90 Base) Mcg/act Aers  (Albuterol Sulfate) .... 2 Puffs Three To Four Times Daily As Needed For Wheezing 6)  Cardizem La 360 Mg Xr24h-Tab (Diltiazem Hcl Coated Beads) .... Take 1 Tablet By Mouth Once A Day 7)  Albuterol Sulfate (2.5 Mg/27ml) 0.083% Nebu (Albuterol Sulfate) .... One Vial Per Nebulizer Qid Prn 8)  Nystatin 100000 Unit/gm Powd (Nystatin) .... Apply Twice Daily To Groin For 10 Days, Then As Needed 9)  Hydroxyzine Hcl 25 Mg Tabs (Hydroxyzine Hcl) .... One Tab By Mouth At Bedtime Prn 10)  Flexeril 5 Mg Tabs (Cyclobenzaprine Hcl) .... One By Mouth Q 8 Hrs As Needed Pain 11)  Vusion 0.25-15-81.35 % Oint (Miconazole-Zinc Oxide-Petrolat) .... Apply Twice Daily To Affected Area As Needed 12)  Exelon 4.6 Mg/24hr Pt24 (Rivastigmine) .... Apply One Patchdaily To Upper Chest or Upper Arms and Back 13)  Clotrimazole 1 % Crea (Clotrimazole) .... Apply Two Times A Day As Needed 14)  Citalopram Hydrobromide 10 Mg Tabs (Citalopram Hydrobromide) .... Take 1 Daily For Depression 15)  Pantoprazole Sodium 40 Mg Tbec (Pantoprazole Sodium) .... Take 1 Daily For Heartburn 16)  Pravastatin Sodium 20 Mg Tabs (Pravastatin Sodium) .... Take 1 Tab By Mouth At Bedtime  Allergies (verified): 1)  ! Pcn 2)  ! Codeine 3)  ! * Duragesic 4)  ! Levaquin 5)  ! Morphine 6)  ! Prednisone 7)  ! * Ciprofloxacin 8)  ! Prednisone  Review of Systems      See HPI General:  Complains of fatigue. Eyes:  Denies discharge, eye  pain, and red eye. ENT:  Denies hoarseness, nasal congestion, sinus pressure, and sore throat. CV:  Denies chest pain or discomfort, difficulty breathing while lying down, palpitations, and swelling of feet. Resp:  Complains of shortness of breath; denies cough and sputum productive. Neuro:  Denies headaches and seizures. Psych:  Complains of anxiety, depression, and mental problems; denies suicidal thoughts/plans, thoughts of violence, and unusual visions or sounds; improved. Endo:  Denies cold intolerance, excessive  thirst, heat intolerance, and weight change. Heme:  Denies abnormal bruising and bleeding. Allergy:  Denies hives or rash and itching eyes.  Physical Exam  General:  Well-developed,obesein no acute distress; alert,appropriate and cooperative throughout examination HEENT: No facial asymmetry,  EOMI, No sinus tenderness, TM's Clear, oropharynx  pink and moist.   Chest: Clear to auscultation bilaterally.  CVS: S1, S2, No murmurs, No S3.   Abd: Soft, Nontender.  ZO:[id number]  ROM spine, hips, shoulders and knees.  Ext: No edema.   CNS: CN 2-12 intact, power tone and sensation normal throughout.   Skin: Intact, no visible lesions or rashes.  Psych: Good eye contact, normal affect.  Memory loss,not anxious or depressed appearing.    Impression & Recommendations:  Problem # 1:  ACUTE CYSTITIS (ICD-595.0) Assessment Comment Only  The following medications were removed from the medication list:    Doxycycline Hyclate 100 Mg Caps (Doxycycline hyclate) .Marland Kitchen... Take 1 capsule by mouth two times a day Her updated medication list for this problem includes:    Septra Ds 800-160 Mg Tabs (Sulfamethoxazole-trimethoprim) .Marland Kitchen... Take 1 tablet by mouth two times a day  Orders: UA Dipstick W/ Micro (manual) (98119) T-Culture, Urine ([id number])  Encouraged to push clear liquids, get enough rest, and take acetaminophen as needed. To be seen in 10 days if no improvement, sooner if worse.  Problem # 2:  DEPRESSION, MAJOR, RECURRENT, SEVERE, W/PSYCHOTIC BEHAVIOR (ICD-296.34) Assessment: Improved  Problem # 3:  HYPERTENSION (ICD-401.9) Assessment: Improved  Her updated medication list for this problem includes:    Cardizem La 360 Mg Xr24h-tab (Diltiazem hcl coated beads) .Marland Kitchen... Take 1 tablet by mouth once a day  Orders: T-Basic Metabolic Panel [id number]) CXR- 2view (CXR) EKG w/ Interpretation (93000)  BP today: 128/84 Prior BP: 142/82 (10/16/2009)  Labs Reviewed: K+: 5.0  (01/10/2009) Creat: : 0.86 (01/10/2009)   Chol: 252 (01/10/2009)   HDL: 48 (01/10/2009)   LDL: 162 (01/10/2009)   TG: 208 (01/10/2009)  Problem # 4:  OBESITY (ICD-278.00) Assessment: Unchanged  Ht: 63 (11/20/2009)   Wt: 227.25 (11/20/2009)   BMI: 40.40 (11/20/2009)  Problem # 5:  ASTHMA (ICD-493.90) Assessment: Improved  Her updated medication list for this problem includes:    Symbicort 80-4.5 Mcg/act Aero (Budesonide-formoterol fumarate) .Marland Kitchen... 2 puffs twice daily    Symbicort 80-4.5 Mcg/act Aero (Budesonide-formoterol fumarate) .Marland Kitchen... 2 pufs twice daily    Proventil Hfa 108 (90 Base) Mcg/act Aers (Albuterol sulfate) .Marland Kitchen... 2 puffs three to four times daily as needed for wheezing    Albuterol Sulfate (2.5 Mg/6ml) 0.083% Nebu (Albuterol sulfate) ..... One vial per nebulizer qid prn  Problem # 6:  HYPERLIPIDEMIA (ICD-272.4) Assessment: Comment Only  The following medications were removed from the medication list:    Simvastatin 40 Mg Tabs (Simvastatin) .Marland Kitchen... Take 1 tab by mouth at bedtime Her updated medication list for this problem includes:    Pravastatin Sodium 20 Mg Tabs (Pravastatin sodium) .Marland Kitchen... Take 1 tab by mouth at bedtime  Labs Reviewed: SGOT: 19 (01/10/2009)   SGPT: 16 (01/10/2009)  HDL:48 (01/10/2009), 48 (09/11/2008)  LDL:162 (01/10/2009), 123 (09/11/2008)  Chol:252 (01/10/2009), 217 (09/11/2008)  Trig:208 (01/10/2009), 230 (09/11/2008)  Complete Medication List: 1)  Hydrocodone-acetaminophen 7.5-750 Mg Tabs (Hydrocodone-acetaminophen) .... Take one tablet every 6-8 hours 2)  Symbicort 80-4.5 Mcg/act Aero (Budesonide-formoterol fumarate) .... 2 puffs twice daily 3)  Alprazolam 0.5 Mg Tbdp (Alprazolam) .Marland Kitchen.. 1 in the morning and 2 at bedtime 4)  Symbicort 80-4.5 Mcg/act Aero (Budesonide-formoterol fumarate) .... 2 pufs twice daily 5)  Proventil Hfa 108 (90 Base) Mcg/act Aers (Albuterol sulfate) .... 2 puffs three to four times daily as needed for wheezing 6)  Cardizem La  360 Mg Xr24h-tab (Diltiazem hcl coated beads) .... Take 1 tablet by mouth once a day 7)  Albuterol Sulfate (2.5 Mg/80ml) 0.083% Nebu (Albuterol sulfate) .... One vial per nebulizer qid prn 8)  Nystatin 100000 Unit/gm Powd (Nystatin) .... Apply twice daily to groin for 10 days, then as needed 9)  Hydroxyzine Hcl 25 Mg Tabs (Hydroxyzine hcl) .... One tab by mouth at bedtime prn 10)  Flexeril 5 Mg Tabs (Cyclobenzaprine hcl) .... One by mouth q 8 hrs as needed pain 11)  Vusion 0.25-15-81.35 % Oint (Miconazole-zinc oxide-petrolat) .... Apply twice daily to affected area as needed 12)  Exelon 4.6 Mg/24hr Pt24 (Rivastigmine) .... Apply one patchdaily to upper chest or upper arms and back 13)  Clotrimazole 1 % Crea (Clotrimazole) .... Apply two times a day as needed 14)  Citalopram Hydrobromide 10 Mg Tabs (Citalopram hydrobromide) .... Take 1 daily for depression 15)  Pantoprazole Sodium 40 Mg Tbec (Pantoprazole sodium) .... Take 1 daily for heartburn 16)  Pravastatin Sodium 20 Mg Tabs (Pravastatin sodium) .... Take 1 tab by mouth at bedtime 17)  Septra Ds 800-160 Mg Tabs (Sulfamethoxazole-trimethoprim) .... Take 1 tablet by mouth two times a day  Other Orders: T-CBC w/Diff ([id number]) T-Lipid Profile [id number]) T-Hepatic Function [id number]) T-TSH [phone])  Patient Instructions: 1)  Please schedule a follow-up appointment in 2 .5months. 2)  You need to lose weight. Consider a lower calorie diet and regular exercise.  3)  BMP prior to visit, ICD-9: 4)  Hepatic Panel prior to visit, ICD-9: 5)  Lipid Panel prior to visit, ICD-9:  today 6)  TSH prior to visit, ICD-9: 7)  CBC w/ Diff prior to visit, ICD-9: 8)  Your EKG shows no evidence of heart damage. 9)  You need a CXR today 10)  pls start med for your cholesterol, it is at your pharmacy Prescriptions: SEPTRA DS 800-160 MG TABS (SULFAMETHOXAZOLE-TRIMETHOPRIM) Take 1 tablet by mouth two times a day  #10 x 0   Entered and Authorized  by:   Syliva Overman MD   Signed by:   Syliva Overman MD on 11/21/2009   Method used:   [facility]        [facility]  [place] [address] [facility]       [address]       [place], [place]  [address]       Ph: [phone] or [phone]       Fax: [phone]   RxID:   [id number] PRAVASTATIN SODIUM 20 MG TABS (PRAVASTATIN SODIUM) Take 1 tab by mouth at bedtime  #30 x 3   Entered and Authorized by:   Syliva Overman MD   Signed by:   Syliva Overman MD on 11/20/2009   Method used:   [facility]        [facility]  [place] [address] [phone][facility]ail[facility]  [address]       [place], [place]  [address]       Ph: [phone] or [phone]       Fax: [phone]   RxID:   [id number]   Laboratory Results   Urine Tests    Routine Urinalysis   Color: yellow Appearance: Clear Glucose: negative   (Normal Range: Negative) Bilirubin: negative   (Normal Range: Negative) Ketone: negative   (Normal Range: Negative) Spec. Gravity: <1.005   (Normal Range: 1.003-1.035) Blood: negative   (Normal Range: Negative) pH: 5.0   (Normal Range: 5.0-8.0) Protein: negative   (Normal Range: Negative) Urobilinogen: 0.2   (Normal Range: 0-1) Nitrite: negative   (Normal Range: Negative) Leukocyte Esterace: small   (Normal Range: Negative)

## 2010-06-23 NOTE — Progress Notes (Signed)
Summary: refill cream  Phone Note Call from Patient   Summary of Call: pt needs cream to go on bottom. please call in [phone]  [facility] Initial call taken by: Rudene Anda,  Sep 23, 2009 9:28 AM  Follow-up for Phone Call        Rx Called In Follow-up by: Adella Hare LPN,  Sep 23, 8293 9:31 AM    Prescriptions: CLOTRIMAZOLE 1 % CREA (CLOTRIMAZOLE) apply two times a day as needed  #37month x 0   Entered by:   Adella Hare LPN   Authorized by:   Syliva Overman MD   Signed by:   Adella Hare LPN on 62/13/0865   Method used:   [facility]        [facility]  [place] [address] [facility]       [address]       [place], [place]  [address]       Ph: [phone] or [phone]       Fax: [phone]   RxID:   [id number]

## 2010-06-23 NOTE — Progress Notes (Signed)
Summary: medicine  Phone Note Call from Patient   Summary of Call: needs [facility]em [facility] [place]  asap she is out Initial call taken by: Lind Guest,  December 10, 2009 3:36 PM  Follow-up for Phone Call        Rx Called In Follow-up by: Adella Hare LPN,  December 10, 2009 4:32 PM    Prescriptions: CARDIZEM LA 360 MG XR24H-TAB (DILTIAZEM HCL COATED BEADS) Take 1 tablet by mouth once a day  #30 x 1   Entered by:   Adella Hare LPN   Authorized by:   Syliva Overman MD   Signed by:   Adella Hare LPN on 16/02/9603   Method used:   [facility]        [facility]  [place] [address] [facility]       [address]       [place], [place]  [address]       Ph: [phone] or [phone]       Fax: [phone]   RxID:   [id number]

## 2010-06-23 NOTE — Progress Notes (Signed)
Summary: clearance for surgery  Phone Note Call from Patient   Summary of Call: will be coming in monday  clearance for  surgery  to take out hardware and please fax to dr. Ardeth Perfect # [phone] what you think about this  when she is done please Initial call taken by: Lind Guest,  November 13, 2009 2:55 PM  Follow-up for Phone Call        I do not see heron my schedule which is full so I am unsure of this msg, pls clarify Follow-up by: Syliva Overman MD,  November 17, 2009 4:48 AM  Additional Follow-up for Phone Call Additional follow up Details #1::        she is coming in thursday to see you Additional Follow-up by: Lind Guest,  November 17, 2009 8:07 AM    Additional Follow-up for Phone Call Additional follow up Details #2::    noted Follow-up by: Syliva Overman MD,  November 17, 2009 11:53 AM

## 2010-06-23 NOTE — Progress Notes (Signed)
  Phone Note Call from Patient   Caller: Spouse Summary of Call: patients husband called stating patients breathing is bad and very nervous and not sleeping advised er or urgent care Initial call taken by: Adella Hare LPN,  March 31, 2010 11:01 AM

## 2010-06-23 NOTE — Assessment & Plan Note (Signed)
Summary: cellulites on both leg/per patient/slj   Vital Signs:  Patient profile:   76 year old female Menstrual status:  hysterectomy Height:      63 inches Weight:      238.50 pounds O2 Sat:      92 % on Room air Pulse rate:   111 / minute Pulse rhythm:   regular Resp:     16 per minute BP sitting:   100 / 80  (left arm)  Vitals Entered By: Mauricia Area CMA (March 12, 2010 4:23 PM)  O2 Flow:  Room air CC: follow up. Fluid in legs and ankles Comments Did not bring meds   Primary Care Provider:  Kerri Perches, MD  CC:  follow up. Fluid in legs and ankles.  History of Present Illness: c/o uncontrolled leg pain,and swelling , right worse than left in the past 4 days. She denies pND, orthopnea, chest pain or palpitations. She reports regular voiding of urine.  Allergies (verified): 1)  ! Pcn 2)  ! Codeine 3)  ! * Duragesic 4)  ! Levaquin 5)  ! Morphine 6)  ! Prednisone 7)  ! * Ciprofloxacin 8)  ! Prednisone  Review of Systems      See HPI General:  Complains of fatigue. ENT:  Denies hoarseness, nasal congestion, and sinus pressure. CV:  Complains of swelling of feet. Resp:  Denies cough, sputum productive, and wheezing. GI:  Denies abdominal pain, constipation, diarrhea, nausea, and vomiting. GU:  Denies dysuria and urinary frequency. MS:  Complains of joint pain, low back pain, mid back pain, and stiffness. Derm:  Complains of changes in color of skin, excessive perspiration, flushing, and itching; chronic. Psych:  Complains of anxiety, depression, irritability, and mental problems; denies suicidal thoughts/plans and thoughts of violence. Endo:  Denies excessive thirst and excessive urination. Heme:  Denies abnormal bruising and bleeding. Allergy:  Complains of seasonal allergies.  Physical Exam  General:  Well-developed,obese,in no acute distress; alert and cooperative throughout examinationpt has flght of ideas, and difficulty canswering questions  appropriately. HEENT: No facial asymmetry,  EOMI, No sinus tenderness, TM's Clear, oropharynx  pink and moist.   Chest: Clear to auscultation bilaterally. decreased air entry bilaterally CVS: S1, S2, No murmurs, No S3.   ION:GEXBM,  Soft, Nontender.  MS: decreased  ROM spine, hips, shoulders and knees.  Ext:one to two plus pitting  edema.   CNS: CN 2-12 intact, power tone and sensation normal throughout.   Skin: Intact, mild erythema of both legs, anterior aspect, unchanged Psych: Good eye contact, normal affect.  Memory loss not anxious or depressed appearing.    Impression & Recommendations:  Problem # 1:  LEG EDEMA, BILATERAL (ICD-782.3) Assessment Deteriorated  Her updated medication list for this problem includes:    Furosemide 20 Mg Tabs (Furosemide) .Marland Kitchen... Take 1 tablet by mouth once a day  as needed for  leg swelling  Orders: Furosemide- Lasix Injection (J1940) Admin of Therapeutic Inj  intramuscular or subcutaneous (84132) Medicare Electronic Prescription (G4010)  Problem # 2:  ASTHMA (ICD-493.90) Assessment: Improved  Her updated medication list for this problem includes:    Symbicort 80-4.5 Mcg/act Aero (Budesonide-formoterol fumarate) .Marland Kitchen... 2 pufs twice daily    Proventil Hfa 108 (90 Base) Mcg/act Aers (Albuterol sulfate) .Marland Kitchen... 2 puffs three to four times daily as needed for wheezing    Albuterol Sulfate (2.5 Mg/57ml) 0.083% Nebu (Albuterol sulfate) ..... One vial per nebulizer four times a day  as needed  Problem # 3:  DEMENTIA (ICD-294.8) Assessment: Comment Only pt to continue exelon patch  Problem # 4:  ANXIETY (ICD-300.00) Assessment: Unchanged  Her updated medication list for this problem includes:    Alprazolam 0.5 Mg Tbdp (Alprazolam) .Marland Kitchen... 1 in the morning and 2 at bedtime    Citalopram Hydrobromide 10 Mg Tabs (Citalopram hydrobromide) .Marland Kitchen... Take 1 daily for depression  Problem # 5:  HYPERTENSION (ICD-401.9) Assessment: Comment Only  Her updated  medication list for this problem includes:    Cardizem La 360 Mg Xr24h-tab (Diltiazem hcl coated beads) .Marland Kitchen... Take 1 tablet by mouth once a day    Furosemide 20 Mg Tabs (Furosemide) .Marland Kitchen... Take 1 tablet by mouth once a day  as needed for  leg swelling  BP today: 100/80 Prior BP: 140/80 (02/11/2010)  Labs Reviewed: K+: 4.3 (11/20/2009) Creat: : 0.69 (11/20/2009)   Chol: 216 (11/20/2009)   HDL: 48 (11/20/2009)   LDL: 137 (11/20/2009)   TG: 153 (11/20/2009)  Complete Medication List: 1)  Hydrocodone-acetaminophen 7.5-750 Mg Tabs (Hydrocodone-acetaminophen) .... Take one tablet every 6-8 hours 2)  Alprazolam 0.5 Mg Tbdp (Alprazolam) .Marland Kitchen.. 1 in the morning and 2 at bedtime 3)  Symbicort 80-4.5 Mcg/act Aero (Budesonide-formoterol fumarate) .... 2 pufs twice daily 4)  Proventil Hfa 108 (90 Base) Mcg/act Aers (Albuterol sulfate) .... 2 puffs three to four times daily as needed for wheezing 5)  Cardizem La 360 Mg Xr24h-tab (Diltiazem hcl coated beads) .... Take 1 tablet by mouth once a day 6)  Albuterol Sulfate (2.5 Mg/52ml) 0.083% Nebu (Albuterol sulfate) .... One vial per nebulizer four times a day  as needed 7)  Cyclobenzaprine Hcl 5 Mg Tabs (Cyclobenzaprine hcl) .... Take 1 at bedtime as needed for muscle spasms 8)  Clotrimazole 1 % Crea (Clotrimazole) .... Apply two times a day as needed 9)  Citalopram Hydrobromide 10 Mg Tabs (Citalopram hydrobromide) .... Take 1 daily for depression 10)  Pantoprazole Sodium 40 Mg Tbec (Pantoprazole sodium) .... Take 1 daily for heartburn 11)  Benzonatate 100 Mg Caps (Benzonatate) .... Take 1 every 8 hrs as needed for cough 12)  Exelon 4.6 Mg/24hr Pt24 (Rivastigmine) .... Apply one patch daily 13)  Septra Ds 800-160 Mg Tabs (Sulfamethoxazole-trimethoprim) .... Take 1 tablet by mouth two times a day 14)  Furosemide 20 Mg Tabs (Furosemide) .... Take 1 tablet by mouth once a day  as needed for  leg swelling 15)  Klor-con M20 20 Meq Cr-tabs (Potassium chloride crys  cr) .... Take 1 tablet by mouth once a day as needed ypou need to take the potassium pill every day you tske the furosemide  Patient Instructions: 1)  f/u as before. 2)  you will geta lasix injection today as well as med will be sent in for leg swelling 3)  Take the furosemide and potassium, one each day  if your legs are swollen Prescriptions: KLOR-CON M20 20 MEQ CR-TABS (POTASSIUM CHLORIDE CRYS CR) Take 1 tablet by mouth once a day as needed ypou need to take the potassium pill every day you tske the furosemide  #30 x 0   Entered and Authorized by:   Syliva Overman MD   Signed by:   Syliva Overman MD on 03/12/2010   Method used:   [facility]        [facility]  [place] [address] [facility]       [address]       [place], [place]  [address]       Ph: [phone] or  [phone]       Fax: [phone]   RxID:   [id number] FUROSEMIDE 20 MG TABS (FUROSEMIDE) Take 1 tablet by mouth once a day  as needed for  leg swelling  #30 x 0   Entered and Authorized by:   Syliva Overman MD   Signed by:   Syliva Overman MD on 03/12/2010   Method used:   [facility]        [facility]  [place] [address] [facility]       [address]       [place], [place]  [address]       Ph: [phone] or [phone]       Fax: [phone]   RxID:   [id number]    Medication Administration  Injection # 1:    Medication: Furosemide- Lasix Injection    Diagnosis: LEG EDEMA, BILATERAL (ICD-782.3)    Route: IM    Site: RUOQ gluteus    Exp Date: 07/23/2010    Lot #: [id number]    Mfr: hospira    Comments: lasix 10mg  given    Patient tolerated injection without complications    Given by: Adella Hare LPN (March 12, 2010 5:03 PM)  Orders Added: 1)  Est. Patient Level IV [62376] 2)  Furosemide- Lasix Injection [J1940] 3)  Admin of Therapeutic Inj  intramuscular or subcutaneous [96372] 4)  Medicare Electronic Prescription [G8553]     Medication Administration  Injection # 1:    Medication:  Furosemide- Lasix Injection    Diagnosis: LEG EDEMA, BILATERAL (ICD-782.3)    Route: IM    Site: RUOQ gluteus    Exp Date: 07/23/2010    Lot #: [id number]    Mfr: hospira    Comments: lasix 10mg  given    Patient tolerated injection without complications    Given by: Adella Hare LPN (March 12, 2010 5:03 PM)  Orders Added: 1)  Est. Patient Level IV [61607] 2)  Furosemide- Lasix Injection [J1940] 3)  Admin of Therapeutic Inj  intramuscular or subcutaneous [96372] 4)  Medicare Electronic Prescription [P7106]
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
## 2010-06-23 NOTE — Miscellaneous (Signed)
  Clinical Lists Changes  Medications: Removed medication of SEPTRA DS 800-160 MG TABS (SULFAMETHOXAZOLE-TRIMETHOPRIM) Take 1 tablet by mouth two times a day Added new medication of NITROFURANTOIN MACROCRYSTAL 100 MG CAPS (NITROFURANTOIN MACROCRYSTAL) Take 1 tablet by mouth two times a day - Signed Rx of NITROFURANTOIN MACROCRYSTAL 100 MG CAPS (NITROFURANTOIN MACROCRYSTAL) Take 1 tablet by mouth two times a day;  #14 x 0;  Signed;  Entered by: Syliva Overman MD;  Authorized by: Syliva Overman MD;  Method used: [facility]  [place] [address], [place], [place]  [address], Ph: [phone] or [phone], Fax: [phone]    Prescriptions: NITROFURANTOIN MACROCRYSTAL 100 MG CAPS (NITROFURANTOIN MACROCRYSTAL) Take 1 tablet by mouth two times a day  #14 x 0   Entered and Authorized by:   Syliva Overman MD   Signed by:   Syliva Overman MD on 11/25/2009   Method used:   [facility]        [facility]  [place] [address]       [address]       [place], [place]  [address]       Ph: [phone] or [phone]       Fax: [phone]   RxID:   [id number]

## 2010-06-23 NOTE — Progress Notes (Signed)
Summary: refill on cream  Phone Note Call from Patient   Summary of Call: PATIENT WANTS TO GO TO MENTAL HEALTH. CAN SHE HAVE REFERRAL? pt  needs cream called in for like iching. the name is clotrimacole. [phone] Initial call taken by: Rudene Anda,  August 26, 2009 10:06 AM  Follow-up for Phone Call        pls do refer her to mental health facility of her choice she has been to [facility] before, if she wants to go back pls try and get appt. pls advise her to [facility] pharmacy send in a refill request on the cream she wants , I will let nursing know to refill x 1 Follow-up by: Syliva Overman MD,  August 26, 2009 12:04 PM  Additional Follow-up for Phone Call Additional follow up Details #1::        nursing pls refill cream x 1 when request comes in from pharmacy if ihave prescribed it before, if not me, let me know the name of the med pls Additional Follow-up by: Syliva Overman MD,  August 26, 2009 12:05 PM    Additional Follow-up for Phone Call Additional follow up Details #2::    cream refilled. Sallie to schedule mental health app Follow-up by: Everitt Amber LPN,  August 26, 2009 1:46 PM  Additional Follow-up for Phone Call Additional follow up Details #3:: Details for Additional Follow-up Action Taken: sent papers over to behavior health to get pt in. they will call her with appt and time.  Additional Follow-up by: Rudene Anda,  August 26, 2009 4:40 PM  Prescriptions: CLOTRIMAZOLE 1 % CREA (CLOTRIMAZOLE) apply two times a day as needed  #64month x 1   Entered by:   Everitt Amber LPN   Authorized by:   Syliva Overman MD   Signed by:   Everitt Amber LPN on 45/40/9811   Method used:   [facility]        [facility]  [place] [address]       [address]       [place], [place]  [address]       Ph: [phone] or [phone]       Fax: [phone]   RxID:   [id number]

## 2010-06-23 NOTE — Progress Notes (Signed)
Summary: rx support hose  Phone Note Call from Patient   Summary of Call: needs a rx sent to [facility] for support hose Initial call taken by: Lind Guest,  April 20, 2010 9:10 AM  Follow-up for Phone Call        script written pls scan and give pt Follow-up by: Syliva Overman MD,  April 20, 2010 11:12 AM  Additional Follow-up for Phone Call Additional follow up Details #1::        Keshia from [facility] is sending patient to [facility] to get fitted for hose, they don't have the proper one. Additional Follow-up by: Curtis Sites,  April 20, 2010 11:47 AM    Additional Follow-up for Phone Call Additional follow up Details #2::    noted Follow-up by: Syliva Overman MD,  April 20, 2010 12:17 PM

## 2010-06-23 NOTE — Letter (Signed)
Summary: NEB. MACHINE  NEB. MACHINE   Imported By: Lind Guest 02/25/2010 14:01:17  _____________________________________________________________________  External Attachment:    Type:   Image     Comment:   External Document

## 2010-06-23 NOTE — Assessment & Plan Note (Signed)
Summary: OV   Vital Signs:  Patient profile:   76 year old female Menstrual status:  hysterectomy Height:      63 inches Weight:      237 pounds BMI:     42.13 O2 Sat:      91 % on Room air Pulse rate:   101 / minute Pulse rhythm:   regular Resp:     16 per minute BP sitting:   100 / 80  (right arm)  Vitals Entered By: Mauricia Area CMA (March 16, 2010 4:37 PM)  Nutrition Counseling: Patient's BMI is greater than 25 and therefore counseled on weight management options.  O2 Flow:  Room air CC: follow up Comments Coud not hear BP   Primary Care Provider:  Kerri Perches, MD  CC:  follow up.  History of Present Illness: Pt returns today with a primary c/o generalised and uncontrolled pain for which she staes it has been recommended that she see rheumatology. She also is concerned about erythema of the ant legs whicvh is chronic.  Allergies (verified): 1)  ! Pcn 2)  ! Codeine 3)  ! * Duragesic 4)  ! Levaquin 5)  ! Morphine 6)  ! Prednisone 7)  ! * Ciprofloxacin 8)  ! Prednisone  Review of Systems      See HPI General:  Complains of fatigue and weakness; denies chills and fever. Eyes:  Denies blurring, eye pain, and red eye. ENT:  Denies nasal congestion, sinus pressure, and sore throat. CV:  Complains of difficulty breathing while lying down and swelling of feet; denies chest pain or discomfort and palpitations. Resp:  Complains of shortness of breath and wheezing; denies cough and sputum productive; intermittent, currently controlled. GI:  Denies abdominal pain, constipation, diarrhea, nausea, and vomiting. GU:  Denies dysuria and urinary frequency. MS:  Complains of joint pain, loss of strength, low back pain, mid back pain, muscle weakness, stiffness, and thoracic pain; worsening and uncontrolled joint p[ains, states ortho has told her they can do nothing else for her, and she should see rheumatology. Derm:  chronic erythema of the legs. Neuro:  Complains of  difficulty with concentration, poor balance, and weakness; denies seizures and sensation of room spinning. Psych:  Complains of anxiety, depression, mental problems, and unusual visions or sounds; denies suicidal thoughts/plans and thoughts of violence. Endo:  Denies cold intolerance, excessive thirst, excessive urination, and heat intolerance. Heme:  Denies abnormal bruising and bleeding. Allergy:  Complains of seasonal allergies.   Impression & Recommendations:  Problem # 1:  HIP PAIN, RIGHT (ICD-719.45) Assessment Deteriorated  Her updated medication list for this problem includes:    Hydrocodone-acetaminophen 7.5-750 Mg Tabs (Hydrocodone-acetaminophen) .Marland Kitchen... Take one tablet every 6-8 hours    Cyclobenzaprine Hcl 5 Mg Tabs (Cyclobenzaprine hcl) .Marland Kitchen... Take 1 at bedtime as needed for muscle spasms  Orders: Rheumatology Referral (Rheumatology)  Problem # 2:  BACK PAIN (ICD-724.5) Assessment: Deteriorated  Her updated medication list for this problem includes:    Hydrocodone-acetaminophen 7.5-750 Mg Tabs (Hydrocodone-acetaminophen) .Marland Kitchen... Take one tablet every 6-8 hours    Cyclobenzaprine Hcl 5 Mg Tabs (Cyclobenzaprine hcl) .Marland Kitchen... Take 1 at bedtime as needed for muscle spasms  Orders: Depo- Medrol 80mg  (J1040) Ketorolac-Toradol 15mg  (O7564) Admin of Therapeutic Inj  intramuscular or subcutaneous (33295) Rheumatology Referral (Rheumatology)  Problem # 3:  ASTHMA (ICD-493.90) Assessment: Unchanged  Her updated medication list for this problem includes:    Symbicort 80-4.5 Mcg/act Aero (Budesonide-formoterol fumarate) .Marland Kitchen... 2 pufs twice daily  Proventil Hfa 108 (90 Base) Mcg/act Aers (Albuterol sulfate) .Marland Kitchen... 2 puffs three to four times daily as needed for wheezing    Albuterol Sulfate (2.5 Mg/82ml) 0.083% Nebu (Albuterol sulfate) ..... One vial per nebulizer four times a day  as needed  Problem # 4:  DEMENTIA (ICD-294.8) Assessment: Unchanged continue exelon patch,  subtherapeutic dose tolerated  Problem # 5:  DEPRESSION, MAJOR, RECURRENT, SEVERE, W/PSYCHOTIC BEHAVIOR (ICD-296.34) Assessment: Deteriorated  Complete Medication List: 1)  Hydrocodone-acetaminophen 7.5-750 Mg Tabs (Hydrocodone-acetaminophen) .... Take one tablet every 6-8 hours 2)  Alprazolam 0.5 Mg Tbdp (Alprazolam) .Marland Kitchen.. 1 in the morning and 2 at bedtime 3)  Symbicort 80-4.5 Mcg/act Aero (Budesonide-formoterol fumarate) .... 2 pufs twice daily 4)  Proventil Hfa 108 (90 Base) Mcg/act Aers (Albuterol sulfate) .... 2 puffs three to four times daily as needed for wheezing 5)  Cardizem La 360 Mg Xr24h-tab (Diltiazem hcl coated beads) .... Take 1 tablet by mouth once a day 6)  Albuterol Sulfate (2.5 Mg/31ml) 0.083% Nebu (Albuterol sulfate) .... One vial per nebulizer four times a day  as needed 7)  Cyclobenzaprine Hcl 5 Mg Tabs (Cyclobenzaprine hcl) .... Take 1 at bedtime as needed for muscle spasms 8)  Clotrimazole 1 % Crea (Clotrimazole) .... Apply two times a day as needed 9)  Citalopram Hydrobromide 10 Mg Tabs (Citalopram hydrobromide) .... Take 1 daily for depression 10)  Pantoprazole Sodium 40 Mg Tbec (Pantoprazole sodium) .... Take 1 daily for heartburn 11)  Benzonatate 100 Mg Caps (Benzonatate) .... Take 1 every 8 hrs as needed for cough 12)  Exelon 4.6 Mg/24hr Pt24 (Rivastigmine) .... Apply one patch daily 13)  Septra Ds 800-160 Mg Tabs (Sulfamethoxazole-trimethoprim) .... Take 1 tablet by mouth two times a day 14)  Furosemide 20 Mg Tabs (Furosemide) .... Take 1 tablet by mouth once a day  as needed for  leg swelling 15)  Klor-con M20 20 Meq Cr-tabs (Potassium chloride crys cr) .... Take 1 tablet by mouth once a day as needed ypou need to take the potassium pill every day you tske the furosemide  Patient Instructions: 1)  F/u in 3 months. 2)  You will be referred to Dr Kellie Simmering abouit your joint pains and back pain 3)  You will get an injection of depomedrol for your feet, also I suggest  you elevate your legs as much as possible. 4)  You will get an injection of toradol for your hip pain   Medication Administration  Injection # 1:    Medication: Depo- Medrol 80mg     Diagnosis: BACK PAIN (ICD-724.5)    Route: IM    Site: RUOQ gluteus    Exp Date: 10/2010    Lot #: [id number]    Mfr: Pharmacia    Patient tolerated injection without complications    Given by: Adella Hare LPN (March 16, 2010 5:28 PM)  Injection # 2:    Medication: Ketorolac-Toradol 15mg     Diagnosis: BACK PAIN (ICD-724.5)    Route: IM    Site: LUOQ gluteus    Exp Date: 03/25/2011    Lot #: [id number]    Mfr: [email or web address]    Comments: toradol 60mg  given    Patient tolerated injection without complications    Given by: Adella Hare LPN (March 16, 2010 5:29 PM)  Orders Added: 1)  Est. Patient Level III [45409] 2)  Depo- Medrol 80mg  [J1040] 3)  Ketorolac-Toradol 15mg  [J1885] 4)  Admin of Therapeutic Inj  intramuscular or subcutaneous [96372] 5)  Rheumatology Referral [Rheumatology]  Medication Administration  Injection # 1:    Medication: Depo- Medrol 80mg     Diagnosis: BACK PAIN (ICD-724.5)    Route: IM    Site: RUOQ gluteus    Exp Date: 10/2010    Lot #: [id number]    Mfr: Pharmacia    Patient tolerated injection without complications    Given by: Adella Hare LPN (March 16, 2010 5:28 PM)  Injection # 2:    Medication: Ketorolac-Toradol 15mg     Diagnosis: BACK PAIN (ICD-724.5)    Route: IM    Site: LUOQ gluteus    Exp Date: 03/25/2011    Lot #: [id number]    Mfr: [email or web address]    Comments: toradol 60mg  given    Patient tolerated injection without complications    Given by: Adella Hare LPN (March 16, 2010 5:29 PM)  Orders Added: 1)  Est. Patient Level III [45409] 2)  Depo- Medrol 80mg  [J1040] 3)  Ketorolac-Toradol 15mg  [J1885] 4)  Admin of Therapeutic Inj  intramuscular or subcutaneous [96372] 5)  Rheumatology Referral [Rheumatology]

## 2010-06-23 NOTE — Progress Notes (Signed)
Summary: rx card  Phone Note Call from Patient   Summary of Call: pt husbands states card is not activated and has took it to pharm [phone] Initial call taken by: Rudene Anda,  July 04, 2009 11:10 AM  Follow-up for Phone Call        Gave a discount card in the office and patient said pharmacist said it wasn't activated but he called the number to activate it and doesn't know why its not working Follow-up by: Everitt Amber,  July 04, 2009 11:22 AM  Additional Follow-up for Phone Call Additional follow up Details #1::        maybe because she has medicare, opr her specific ins carrier, the pharmacist should be able to tell her, they deal with that sort of thing Additional Follow-up by: Syliva Overman MD,  July 04, 2009 1:03 PM    Additional Follow-up for Phone Call Additional follow up Details #2::    Talked to [facility] and she said they figured  out the problem.  Follow-up by: Everitt Amber,  July 04, 2009 2:56 PM

## 2010-06-23 NOTE — Letter (Signed)
Summary: History form  History form   Imported By: Jacklynn Ganong 06/26/2009 16:47:58  _____________________________________________________________________  External Attachment:    Type:   Image     Comment:   External Document

## 2010-06-23 NOTE — Medication Information (Signed)
Summary: Tax adviser   Imported By: Lind Guest 04/20/2010 11:38:02  _____________________________________________________________________  External Attachment:    Type:   Image     Comment:   External Document

## 2010-06-23 NOTE — Progress Notes (Signed)
Summary: neb  Phone Note Call from Patient   Summary of Call: needs to get a new neb sent to [facility] [phone] Initial call taken by: Rudene Anda,  February 24, 2010 3:42 PM  Follow-up for Phone Call        rx written, pls send and let her know Follow-up by: Syliva Overman MD,  February 25, 2010 8:22 AM  Additional Follow-up for Phone Call Additional follow up Details #1::        New rx for nebulizer sent to [place] Additional Follow-up by: Everitt Amber LPN,  February 25, 2010 8:32 AM

## 2010-06-23 NOTE — Assessment & Plan Note (Signed)
Summary: WOUND CHECK/POST OP 06/26/09/CAF   Visit Type:  Follow-up Primary Provider:  Kerri Perches, MD  CC:  post op wrist.  History of Present Illness: DOS 06/26/09 right wrist.  POD 18  Procedure ORIF right wrist.  [name] comes in today for wound check after being on Bactrim and getting a volar splint  Wound looks much improved closing nicely no signs of infection  Continue Bactrim follow up in one week for repeat wound check  She complained of RIGHT knee swelling pain history of arthritis no injury pain has become severe throbbing nonradiating  Exam shows range of motion of 115 full extension mild joint effusion mild joint line tenderness medially negative McMurray's neurovascular exam intact muscle tone normal strength excellent including extension no instability is noted    Allergies: 1)  ! Pcn 2)  ! Codeine 3)  ! * Duragesic 4)  ! Levaquin 5)  ! Morphine 6)  ! Prednisone 7)  ! * Ciprofloxacin 8)  ! Prednisone   Impression & Recommendations:  Problem # 1:  AFTERCARE HEALING TRAUMATIC FRACTURE UPPER ARM (ICD-V54.11) Assessment Improved  Orders: Post-Op Check ([address])  Problem # 2:  OSTEOARTHRITIS (ICD-715.90) Assessment: New  inject RIGHT knee Verbal consent was obtained. The knee was prepped with alcohol and ethyl chloride. 1 cc of depomedrol 40mg /cc and 4 cc of lidocaine 1% was injected. there were no complications.  Orders: Est. Patient Level III (60454) Joint Aspirate / Injection, Large (20610) Depo- Medrol 40mg  (J1030)  Patient Instructions: 1)  You have received an injection of cortisone today. You may experience increased pain at the injection site. Apply ice pack to the area for 20 minutes every 2 hours and take 2 xtra strength tylenol every 8 hours. This increased pain will usually resolve in 24 hours. The injection will take effect in 3-10 days.  2)  1 week wound check wrist

## 2010-06-23 NOTE — Progress Notes (Signed)
Summary: NEEDS SOMEONE THAT WILL GET HER IN FASTER  Phone Note Call from Patient   Summary of Call: NEEDS TO FIND SOMEONE ELSE THAT IS GOING TO BE QUICKER TO GET HER AN APPOINMENT  THE ONES UP [facility]AIRS [facility] ARE  NOT [facility] S[facility] BACK AT [phone] Initial call taken by: Lind Guest,  March 22, 2008 9:19 AM  Follow-up for Phone Call        pt has appt with dr,rodenbough today. pt aware  Follow-up by: Rudene Anda,  March 22, 2008 10:43 AM

## 2010-06-23 NOTE — Progress Notes (Signed)
Summary: RESULTS  Phone Note Call from Patient   Summary of Call: WANTS RESULTS  AND STILL IN PAIN AND Pedro Earls BACK  [phone] Initial call taken by: Lind Guest,  June 10, 2009 8:26 AM  Follow-up for Phone Call        patient is in office today Follow-up by: Worthy Keeler LPN,  June 10, 2009 11:38 AM

## 2010-06-23 NOTE — Assessment & Plan Note (Signed)
Summary: office visit   Vital Signs:  Patient profile:   76 year old female Menstrual status:  hysterectomy Height:      63 inches Weight:      237 pounds BMI:     42.13 O2 Sat:      90 % Pulse rate:   108 / minute Pulse rhythm:   regular Resp:     16 per minute BP sitting:   130 / 74  (left arm) Cuff size:   large  Vitals Entered By: Everitt Amber LPN (August 21, 2009 1:54 PM)  Nutrition Counseling: Patient's BMI is greater than 25 and therefore counseled on weight management options. CC: Legs are sore and left leg is swollen some. Sounded wheezy. States she has cellulitis   Primary Care Provider:  Kerri Perches, MD  CC:  Legs are sore and left leg is swollen some. Sounded wheezy. States she has cellulitis.  History of Present Illness: Ptg in forf/u of recenrt ED visit. At that time her main complaint was of a rash  which she states has improved significantly. on review of the Ed note, mention was made that her daughterexpressed concerns about ms Weil's mental health. Kameron states that she rcently put her daughter out of the house becvause of her bejhavior, and she went on to calmy state that she was uncertain as to whethter or not MrBailey was her husband. She insisted she was not sure of this. I asked her if Mr Mauricia Area could enter the exam room, she afgreed. I questioned him directly about his wife's mental state, he said that he could "handle her " currently as she was not violernt. He stated that Arleatha had also been telling him that she did not recognoize him as her husband, but he appeared unperturbed about that. As far as his daughter being asked to leave, he said that he was ok with that , it was Fatina's decision. after i had left the room the pt and her spouse called me back to check on her legs which she stated were sore red and swollen, this is their chronic state  Current Medications (verified): 1)  Hydrocodone-Acetaminophen 7.5-750 Mg  Tabs  (Hydrocodone-Acetaminophen) .... Take One Tablet Every 6-8 Hours 2)  Fosamax 70 Mg Tabs (Alendronate Sodium) .... One Tab By Mouth Every Week 3)  Symbicort 80-4.5 Mcg/act Aero (Budesonide-Formoterol Fumarate) .... 2 Puffs Twice Daily 4)  Cvs Fiber Laxative 625 Mg Tabs (Calcium Polycarbophil) .... Take 1 Tablet By Mouth Two Times A Day 5)  Oscal 500/200 D-3 500-200 Mg-Unit Tabs (Calcium-Vitamin D) .... Take 1 Tablet By Mouth Three Times A Day 6)  Alprazolam 0.5 Mg Tbdp (Alprazolam) .Marland Kitchen.. 1 in The Morning and 2 At Bedtime 7)  Symbicort 80-4.5 Mcg/act Aero (Budesonide-Formoterol Fumarate) .... 2 Pufs Twice Daily 8)  Proventil Hfa 108 (90 Base) Mcg/act Aers (Albuterol Sulfate) .... 2 Puffs Three To Four Times Daily As Needed For Wheezing 9)  Vusion 0.25-15-81.35 % Oint (Miconazole-Zinc Oxide-Petrolat) .... Apply To Affected Area Two Times A Day 10)  Nystatin 100000 Unit/gm Crea (Nystatin) .... Apply To Affected Area Twice Daily As Needed 11)  Simvastatin 40 Mg Tabs (Simvastatin) .... Take 1 Tab By Mouth At Bedtime 12)  Cardizem La 360 Mg Xr24h-Tab (Diltiazem Hcl Coated Beads) .... Take 1 Tablet By Mouth Once A Day 13)  Albuterol Sulfate (2.5 Mg/28ml) 0.083% Nebu (Albuterol Sulfate) .... One Vial Per Nebulizer Qid Prn 14)  Nystatin 100000 Unit/gm Powd (Nystatin) .... Apply Twice Daily To  Groin For 10 Days, Then As Needed 15)  Hydroxyzine Hcl 25 Mg Tabs (Hydroxyzine Hcl) .... One Tab By Mouth At Bedtime Prn 16)  Flexeril 5 Mg Tabs (Cyclobenzaprine Hcl) .... One By Mouth Q 8 Hrs As Needed Pain 17)  Vusion 0.25-15-81.35 % Oint (Miconazole-Zinc Oxide-Petrolat) .... Apply Twice Daily To Affected Area As Needed 18)  Exelon 4.6 Mg/24hr Pt24 (Rivastigmine) .... Apply One Patchdaily To Upper Chest or Upper Arms and Back 19)  Cephalexin 500 Mg Tabs (Cephalexin) .... Take 1 Three Times A Day X 10 Days 20)  Clotrimazole 1 % Crea (Clotrimazole) .... Apply Two Times A Day As Needed  Allergies (verified): 1)  !  Pcn 2)  ! Codeine 3)  ! * Duragesic 4)  ! Levaquin 5)  ! Morphine 6)  ! Prednisone 7)  ! * Ciprofloxacin 8)  ! Prednisone  Review of Systems      See HPI General:  Complains of fatigue and malaise; denies chills and fever. Eyes:  Denies discharge and red eye. ENT:  Denies hoarseness, nasal congestion, sinus pressure, and sore throat. CV:  Complains of swelling of hands; denies chest pain or discomfort and palpitations. Resp:  Complains of shortness of breath and wheezing; denies cough. GI:  Denies constipation, diarrhea, nausea, and vomiting. GU:  Denies dysuria and urinary frequency. MS:  Complains of joint pain, low back pain, and mid back pain. Derm:  Complains of itching, lesion(s), and rash. Neuro:  Complains of memory loss and poor balance; denies headaches and seizures; ambulates with a walker. Psych:  Complains of anxiety, depression, and mental problems; denies suicidal thoughts/plans and thoughts of violence. Endo:  Denies cold intolerance, excessive hunger, excessive thirst, excessive urination, heat intolerance, polyuria, and weight change. Heme:  Denies abnormal bruising and bleeding. Allergy:  Complains of seasonal allergies.  Physical Exam  General:  Well-developed,obese,in no acute distress; alert and  cooperative throughout examination HEENT: No facial asymmetry,  EOMI, No sinus tenderness, TM's Clear, oropharynx  pink and moist.   Chest:decreased air entry with scattered wheezes CVS: S1, S2, No murmurs, No S3.   Abd: Soft, Nontender.  MS: decreased  ROM spine, hips, shoulders and knees.  Ext:one plus pitting edema bilaterally   CNS: CN 2-12 intact, power tone and sensation normal throughout.   Skin: mild erythema of distal half of both legs, unchanged from previous, no warmth or excessive tenderness, no drainage, small scratched areas on post calves with skin breakdown.  Psych: Good eye contact, flatl affect.  Memory loss,mildly anxious not depressed  appearing.    Impression & Recommendations:  Problem # 1:  CELLULITIS (ICD-682.9) Assessment Improved  Her updated medication list for this problem includes:    Doxycycline Hyclate 100 Mg Caps (Doxycycline hyclate) .Marland Kitchen... Take 1 capsule by mouth two times a day  bactroban ointment samples were given to apply to open lesions  Problem # 2:  DEMENTIA (ICD-294.8) Assessment: Comment Only pt to continue exelon patch  Problem # 3:  ANXIETY (ICD-300.00) Assessment: Deteriorated  The following medications were removed from the medication list:    Citalopram Hydrobromide 10 Mg Tabs (Citalopram hydrobromide) .Marland Kitchen... Take 1 tablet by mouth once a day Her updated medication list for this problem includes:    Alprazolam 0.5 Mg Tbdp (Alprazolam) .Marland Kitchen... 1 in the morning and 2 at bedtime    Hydroxyzine Hcl 25 Mg Tabs (Hydroxyzine hcl) ..... One tab by mouth at bedtime prn  Problem # 4:  DEPRESSION, MAJOR, RECURRENT, SEVERE, W/PSYCHOTIC BEHAVIOR (ICD-296.34)  Assessment: Deteriorated  Problem # 5:  OBESITY (ICD-278.00) Assessment: Unchanged  Ht: 63 (08/21/2009)   Wt: 237 (08/21/2009)   BMI: 42.13 (08/21/2009)  Problem # 6:  OSTEOARTHRITIS (ICD-715.90) Assessment: Deteriorated  Her updated medication list for this problem includes:    Hydrocodone-acetaminophen 7.5-750 Mg Tabs (Hydrocodone-acetaminophen) .Marland Kitchen... Take one tablet every 6-8 hours  Problem # 7:  HYPERTENSION (ICD-401.9) Assessment: Unchanged  The following medications were removed from the medication list:    Maxzide-25 37.5-25 Mg Tabs (Triamterene-hctz) .Marland Kitchen... Take 1 tablet by mouth once a day Her updated medication list for this problem includes:    Cardizem La 360 Mg Xr24h-tab (Diltiazem hcl coated beads) .Marland Kitchen... Take 1 tablet by mouth once a day  BP today: 130/74 Prior BP: 120/70 (08/12/2009)  Labs Reviewed: K+: 5.0 (01/10/2009) Creat: : 0.86 (01/10/2009)   Chol: 252 (01/10/2009)   HDL: 48 (01/10/2009)   LDL: 162  (01/10/2009)   TG: 208 (01/10/2009)  Complete Medication List: 1)  Hydrocodone-acetaminophen 7.5-750 Mg Tabs (Hydrocodone-acetaminophen) .... Take one tablet every 6-8 hours 2)  Fosamax 70 Mg Tabs (Alendronate sodium) .... One tab by mouth every week 3)  Symbicort 80-4.5 Mcg/act Aero (Budesonide-formoterol fumarate) .... 2 puffs twice daily 4)  Cvs Fiber Laxative 625 Mg Tabs (Calcium polycarbophil) .... Take 1 tablet by mouth two times a day 5)  Oscal 500/200 D-3 500-200 Mg-unit Tabs (Calcium-vitamin d) .... Take 1 tablet by mouth three times a day 6)  Alprazolam 0.5 Mg Tbdp (Alprazolam) .Marland Kitchen.. 1 in the morning and 2 at bedtime 7)  Symbicort 80-4.5 Mcg/act Aero (Budesonide-formoterol fumarate) .... 2 pufs twice daily 8)  Proventil Hfa 108 (90 Base) Mcg/act Aers (Albuterol sulfate) .... 2 puffs three to four times daily as needed for wheezing 9)  Vusion 0.25-15-81.35 % Oint (Miconazole-zinc oxide-petrolat) .... Apply to affected area two times a day 10)  Nystatin 100000 Unit/gm Crea (Nystatin) .... Apply to affected area twice daily as needed 11)  Simvastatin 40 Mg Tabs (Simvastatin) .... Take 1 tab by mouth at bedtime 12)  Cardizem La 360 Mg Xr24h-tab (Diltiazem hcl coated beads) .... Take 1 tablet by mouth once a day 13)  Albuterol Sulfate (2.5 Mg/91ml) 0.083% Nebu (Albuterol sulfate) .... One vial per nebulizer qid prn 14)  Nystatin 100000 Unit/gm Powd (Nystatin) .... Apply twice daily to groin for 10 days, then as needed 15)  Hydroxyzine Hcl 25 Mg Tabs (Hydroxyzine hcl) .... One tab by mouth at bedtime prn 16)  Flexeril 5 Mg Tabs (Cyclobenzaprine hcl) .... One by mouth q 8 hrs as needed pain 17)  Vusion 0.25-15-81.35 % Oint (Miconazole-zinc oxide-petrolat) .... Apply twice daily to affected area as needed 18)  Exelon 4.6 Mg/24hr Pt24 (Rivastigmine) .... Apply one patchdaily to upper chest or upper arms and back 19)  Clotrimazole 1 % Crea (Clotrimazole) .... Apply two times a day as needed 20)   Doxycycline Hyclate 100 Mg Caps (Doxycycline hyclate) .... Take 1 capsule by mouth two times a day  Patient Instructions: 1)  Please schedule a follow-up appointment in 2 months. 2)  You will be referred  for assistance fo people with memory impairment. 3)  no med changes at this time Prescriptions: DOXYCYCLINE HYCLATE 100 MG CAPS (DOXYCYCLINE HYCLATE) Take 1 capsule by mouth two times a day  #14 x 0   Entered and Authorized by:   Syliva Overman MD   Signed by:   Syliva Overman MD on 08/21/2009   Method used:   Electron[facility] to  [facility]  [place] [address] (retail)       [address]       [place], [place]  [address]       Ph: [phone] or [phone]       Fax: [phone]   RxID:   [id number]

## 2010-06-23 NOTE — Progress Notes (Signed)
Summary: PATCHES  Phone Note Call from Patient   Summary of Call: WANTS YOU TO CALL HER ABOUT THE PATCHES  SHE IS FALLING AND HER HUSBAND CALLED MAX CALL BACK AT [phone] Initial call taken by: Lind Guest,  July 18, 2009 9:36 AM  Follow-up for Phone Call        +husband states patient tolerated lower dose of exelon patch better, the higher dose makes patient fall Follow-up by: Adella Hare LPN,  July 18, 2009 9:48 AM  Additional Follow-up for Phone Call Additional follow up Details #1::        advise i will send in the lower dose of thepatch instead if this is the case.they uinfortunately cannot cut the 9.5 dose in half pls let them know, try and see if [facility] can provide samles x 1 month for her since they filled the higher dose and are having problems plsn(one in the closet and ins may not cover...since its a dose change ins mAY cover , wait to hear back from them before calling for samples)ll Additional Follow-up by: Syliva Overman MD,  July 18, 2009 12:11 PM    Additional Follow-up for Phone Call Additional follow up Details #2::    husband aware  New/Updated Medications: EXELON 4.6 MG/24HR PT24 (RIVASTIGMINE) apply one patchdaily to upper chest or upper arms and back Prescriptions: EXELON 4.6 MG/24HR PT24 (RIVASTIGMINE) apply one patchdaily to upper chest or upper arms and back  #30 x 4   Entered and Authorized by:   Syliva Overman MD   Signed by:   Syliva Overman MD on 07/18/2009   Method used:   Printed then faxed to ...       [facility]  [place] [address] (retail)       [address]       [place], [place]  [address]       Ph: [phone] or [phone]       Fax: [phone]   RxID:   [id number]

## 2010-06-23 NOTE — Letter (Signed)
Summary: OVERNIGHT OXIMETRY  OVERNIGHT OXIMETRY   Imported By: Lind Guest 04/02/2010 09:21:58  _____________________________________________________________________  External Attachment:    Type:   Image     Comment:   External Document

## 2010-06-23 NOTE — Assessment & Plan Note (Signed)
Summary: office visit   Vital Signs:  Patient profile:   76 year old female Menstrual status:  hysterectomy Height:      63 inches Weight:      242.75 pounds BMI:     43.16 O2 Sat:      90 % on Room air Pulse rate:   96 / minute Pulse rhythm:   regular Resp:     16 per minute BP sitting:   148 / 70  (left arm)  Vitals Entered By: Adella Hare LPN (March 09, 2010 3:51 PM)  Nutrition Counseling: Patient's BMI is greater than 25 and therefore counseled on weight management options.  O2 Flow:  Room air CC: sob, wheezing Is Patient Diabetic? No Pain Assessment Patient in pain? no        Primary Care Provider:  Kerri Perches, MD  CC:  sob and wheezing.  History of Present Illness: head and chest congestion, fever and chills for the past 3 to 4 days, state sshe has also been wheezing and experiencing shortness of breath with minimal activity. she denies uncontrolled depression, and is currently experiencing no halliucinations or excessive paranoia.  Current Medications (verified): 1)  Hydrocodone-Acetaminophen 7.5-750 Mg  Tabs (Hydrocodone-Acetaminophen) .... Take One Tablet Every 6-8 Hours 2)  Alprazolam 0.5 Mg Tbdp (Alprazolam) .Marland Kitchen.. 1 in The Morning and 2 At Bedtime 3)  Symbicort 80-4.5 Mcg/act Aero (Budesonide-Formoterol Fumarate) .... 2 Pufs Twice Daily 4)  Proventil Hfa 108 (90 Base) Mcg/act Aers (Albuterol Sulfate) .... 2 Puffs Three To Four Times Daily As Needed For Wheezing 5)  Cardizem La 360 Mg Xr24h-Tab (Diltiazem Hcl Coated Beads) .... Take 1 Tablet By Mouth Once A Day 6)  Albuterol Sulfate (2.5 Mg/65ml) 0.083% Nebu (Albuterol Sulfate) .... One Vial Per Nebulizer Four Times A Day  As Needed 7)  Cyclobenzaprine Hcl 5 Mg Tabs (Cyclobenzaprine Hcl) .... Take 1 At Bedtime As Needed For Muscle Spasms 8)  Clotrimazole 1 % Crea (Clotrimazole) .... Apply Two Times A Day As Needed 9)  Citalopram Hydrobromide 10 Mg Tabs (Citalopram Hydrobromide) .... Take 1 Daily For  Depression 10)  Pantoprazole Sodium 40 Mg Tbec (Pantoprazole Sodium) .... Take 1 Daily For Heartburn 11)  Benzonatate 100 Mg Caps (Benzonatate) .... Take 1 Every 8 Hrs As Needed For Cough 12)  Exelon 4.6 Mg/24hr Pt24 (Rivastigmine) .... Apply One Patch Daily  Allergies (verified): 1)  ! Pcn 2)  ! Codeine 3)  ! * Duragesic 4)  ! Levaquin 5)  ! Morphine 6)  ! Prednisone 7)  ! * Ciprofloxacin 8)  ! Prednisone  Review of Systems      See HPI General:  Complains of chills, fatigue, loss of appetite, and weakness. Eyes:  Denies discharge, eye pain, and red eye. ENT:  Complains of nasal congestion, postnasal drainage, and sinus pressure. CV:  Complains of shortness of breath with exertion and swelling of feet; denies palpitations. Resp:  Complains of cough, shortness of breath, sputum productive, and wheezing. GI:  Denies abdominal pain, constipation, diarrhea, nausea, and vomiting. GU:  Complains of incontinence; denies dysuria and urinary frequency. MS:  Complains of joint pain, muscle weakness, and stiffness. Derm:  Complains of changes in color of skin, lesion(s), and rash; chronic erythema of ant legs. Psych:  Complains of mental problems; denies suicidal thoughts/plans, thoughts of violence, and unusual visions or sounds. Endo:  Denies cold intolerance, excessive thirst, excessive urination, and heat intolerance. Heme:  Denies abnormal bruising and bleeding. Allergy:  Complains of persistent infections  and seasonal allergies.  Physical Exam  General:  Well-developed,obese,in no acute distress; alert,a and cooperative throughout examination HEENT: No facial asymmetry,  EOMI, maxillary sinus tenderness, TM's Clear, oropharynx  pink and moist.   Chest: crackles and wheezes  bilaterally. decreased air entry . CVS: S1, S2, No murmurs, No S3.   Abd: Soft, Nontender.  MS: decreased ROM spine, hips, shoulders and knees.  Ext: No edema.   CNS: CN 2-12 intact, power tone and sensation  normal throughout.   Skin: Intact, no visible lesions or rashes.  Psych: Good eye contact, normal affect.  Memory loss, not anxious or depressed appearing.    Impression & Recommendations:  Problem # 1:  ACUTE BRONCHITIS (ICD-466.0) Assessment Comment Only  Her updated medication list for this problem includes:    Symbicort 80-4.5 Mcg/act Aero (Budesonide-formoterol fumarate) .Marland Kitchen... 2 pufs twice daily    Proventil Hfa 108 (90 Base) Mcg/act Aers (Albuterol sulfate) .Marland Kitchen... 2 puffs three to four times daily as needed for wheezing    Albuterol Sulfate (2.5 Mg/36ml) 0.083% Nebu (Albuterol sulfate) ..... One vial per nebulizer four times a day  as needed    Benzonatate 100 Mg Caps (Benzonatate) .Marland Kitchen... Take 1 every 8 hrs as needed for cough    Septra Ds 800-160 Mg Tabs (Sulfamethoxazole-trimethoprim) .Marland Kitchen... Take 1 tablet by mouth two times a day  Orders: Medicare Electronic Prescription [phone])  Problem # 2:  ASTHMA (ICD-493.90) Assessment: Deteriorated  Her updated medication list for this problem includes:    Symbicort 80-4.5 Mcg/act Aero (Budesonide-formoterol fumarate) .Marland Kitchen... 2 pufs twice daily    Proventil Hfa 108 (90 Base) Mcg/act Aers (Albuterol sulfate) .Marland Kitchen... 2 puffs three to four times daily as needed for wheezing    Albuterol Sulfate (2.5 Mg/22ml) 0.083% Nebu (Albuterol sulfate) ..... One vial per nebulizer four times a day  as needed  Problem # 3:  DEMENTIA (ICD-294.8) Assessment: Deteriorated continueexelon patch  Problem # 4:  ANXIETY (ICD-300.00) Assessment: Unchanged  Her updated medication list for this problem includes:    Alprazolam 0.5 Mg Tbdp (Alprazolam) .Marland Kitchen... 1 in the morning and 2 at bedtime    Citalopram Hydrobromide 10 Mg Tabs (Citalopram hydrobromide) .Marland Kitchen... Take 1 daily for depression  Problem # 5:  HYPERTENSION (ICD-401.9) Assessment: Deteriorated  Her updated medication list for this problem includes:    Cardizem La 360 Mg Xr24h-tab (Diltiazem hcl coated  beads) .Marland Kitchen... Take 1 tablet by mouth once a day    Furosemide 20 Mg Tabs (Furosemide) .Marland Kitchen... Take 1 tablet by mouth once a day  as needed for  leg swelling Patient advised to follow low sodium diet rich in fruit and vegetables, and to commit to at least 30 minutes 5 days per week of regular exercise , to improve blood presure control.   BP today: 148/70 Prior BP: 140/80 (02/11/2010)  Labs Reviewed: K+: 4.3 (11/20/2009) Creat: : 0.69 (11/20/2009)   Chol: 216 (11/20/2009)   HDL: 48 (11/20/2009)   LDL: 137 (11/20/2009)   TG: 153 (11/20/2009)  Problem # 6:  OBESITY (ICD-278.00) Assessment: Deteriorated  Ht: 63 (03/09/2010)   Wt: 242.75 (03/09/2010)   BMI: 43.16 (03/09/2010) therapeutic lifestyle change discussed and encouraged  Complete Medication List: 1)  Hydrocodone-acetaminophen 7.5-750 Mg Tabs (Hydrocodone-acetaminophen) .... Take one tablet every 6-8 hours 2)  Alprazolam 0.5 Mg Tbdp (Alprazolam) .Marland Kitchen.. 1 in the morning and 2 at bedtime 3)  Symbicort 80-4.5 Mcg/act Aero (Budesonide-formoterol fumarate) .... 2 pufs twice daily 4)  Proventil Hfa 108 (90 Base) Mcg/act Aers (  Albuterol sulfate) .... 2 puffs three to four times daily as needed for wheezing 5)  Cardizem La 360 Mg Xr24h-tab (Diltiazem hcl coated beads) .... Take 1 tablet by mouth once a day 6)  Albuterol Sulfate (2.5 Mg/33ml) 0.083% Nebu (Albuterol sulfate) .... One vial per nebulizer four times a day  as needed 7)  Cyclobenzaprine Hcl 5 Mg Tabs (Cyclobenzaprine hcl) .... Take 1 at bedtime as needed for muscle spasms 8)  Clotrimazole 1 % Crea (Clotrimazole) .... Apply two times a day as needed 9)  Citalopram Hydrobromide 10 Mg Tabs (Citalopram hydrobromide) .... Take 1 daily for depression 10)  Pantoprazole Sodium 40 Mg Tbec (Pantoprazole sodium) .... Take 1 daily for heartburn 11)  Benzonatate 100 Mg Caps (Benzonatate) .... Take 1 every 8 hrs as needed for cough 12)  Exelon 4.6 Mg/24hr Pt24 (Rivastigmine) .... Apply one patch  daily 13)  Septra Ds 800-160 Mg Tabs (Sulfamethoxazole-trimethoprim) .... Take 1 tablet by mouth two times a day 14)  Furosemide 20 Mg Tabs (Furosemide) .... Take 1 tablet by mouth once a day  as needed for  leg swelling 15)  Klor-con M20 20 Meq Cr-tabs (Potassium chloride crys cr) .... Take 1 tablet by mouth once a day as needed ypou need to take the potassium pill every day you tske the furosemide  Patient Instructions: 1)  F/U as before. 2)  You are being treated for uncomntrolled asthma, bronchitis and sinusits. 3)  You will get symbicort x 2 in the office, also an antibiotic is being sent in for the infection. 4)  pls use the breathing treatments 3 times daily for the next 3 to 5 days till you start to feel better Prescriptions: SEPTRA DS 800-160 MG TABS (SULFAMETHOXAZOLE-TRIMETHOPRIM) Take 1 tablet by mouth two times a day  #20 x 0   Entered and Authorized by:   Syliva Overman MD   Signed by:   Syliva Overman MD on 03/09/2010   Method used:   Electronically to        [facility]  [place] [address] (retail)       [address]       [place], [place]  [address]       Ph: [phone] or [phone]       Fax: [phone]   RxID:   [id number]    Orders Added: 1)  Est. Patient Level IV [62694] 2)  Medicare Electronic Prescription [phone]

## 2010-06-23 NOTE — Progress Notes (Signed)
  Phone Note Call from Patient   Caller: husband Summary of Call: husband states somthing has to be done about [name], states patient is showing herself cursing and being ugly, left the house in her car and he does not feel she is capable of driving, patient went to neighbors house and didnt know where she was, husband is very worried, patient has left again, police are looking for her Initial call taken by: Adella Hare LPN,  August 22, 2009 10:42 AM  Follow-up for Phone Call        pls advise spouse since pt is obviously a danger to herself and others she needs to be taken tothe hospital for involuntary commitment if she will not go on her own. this needs to haoppen today as soon as she is found Follow-up by: Syliva Overman MD,  August 22, 2009 12:17 PM  Additional Follow-up for Phone Call Additional follow up Details #1::        Advised patient's husband of response and he states that he doesn't have a car right now but would try to get her to go when he got a way Additional Follow-up by: Everitt Amber LPN,  August 22, 2009 12:22 PM

## 2010-06-23 NOTE — Assessment & Plan Note (Signed)
Summary: cellulitis? - room 1   Vital Signs:  Patient profile:   76 year old female Menstrual status:  hysterectomy Height:      63 inches Weight:      229 pounds BMI:     40.71 O2 Sat:      95 % on Room air Pulse rate:   89 / minute Resp:     16 per minute BP sitting:   120 / 70  (left arm)  Vitals Entered By: Adella Hare LPN (August 12, 2009 1:05 PM) CC: cellulitis on both legs? Is Patient Diabetic? No Pain Assessment Patient in pain? no        Primary Provider:  Kerri Perches, MD  CC:  cellulitis on both legs?.  History of Present Illness: Pt is here today stating "I have cellulitis in my legs." She has had this previously. Has noted redness & pain in her legs again.  Pt vague as to onset but sounds like in the last wk.  No fever or chills.  She also c/o chronic rash, redness, & itching in her groin area.  She has tried prescription creams, powders & pills & states nothing completely clears it up.  The pills did help some.  She states the creams & powders "were a waste of money."  Pt also has an urinary incontinence issue.  "I'm always wet."   Current Medications (verified): 1)  Hydrocodone-Acetaminophen 7.5-750 Mg  Tabs (Hydrocodone-Acetaminophen) .... Take One Tablet Every 6-8 Hours 2)  Fosamax 70 Mg Tabs (Alendronate Sodium) .... One Tab By Mouth Every Week 3)  Symbicort 80-4.5 Mcg/act Aero (Budesonide-Formoterol Fumarate) .... 2 Puffs Twice Daily 4)  [facility] Fiber Laxative 625 Mg Tabs (Calcium Polycarbophil) .... Take 1 Tablet By Mouth Two Times A Day 5)  Oscal 500/200 D-3 500-200 Mg-Unit Tabs (Calcium-Vitamin D) .... Take 1 Tablet By Mouth Three Times A Day 6)  Omeprazole 40 Mg Cpdr (Omeprazole) .... Take 1 Tablet By Mouth Once A Day 7)  Alprazolam 0.5 Mg Tbdp (Alprazolam) .Marland Kitchen.. 1 in The Morning and 2 At Bedtime 8)  Citalopram Hydrobromide 10 Mg Tabs (Citalopram Hydrobromide) .... Take 1 Tablet By Mouth Once A Day 9)  Symbicort 80-4.5 Mcg/act Aero  (Budesonide-Formoterol Fumarate) .... 2 Pufs Twice Daily 10)  Proventil Hfa 108 (90 Base) Mcg/act Aers (Albuterol Sulfate) .... 2 Puffs Three To Four Times Daily As Needed For Wheezing 11)  Vusion 0.25-15-81.35 % Oint (Miconazole-Zinc Oxide-Petrolat) .... Apply To Affected Area Two Times A Day 12)  Nystatin 100000 Unit/gm Crea (Nystatin) .... Apply To Affected Area Twice Daily As Needed 13)  Maxzide-25 37.5-25 Mg Tabs (Triamterene-Hctz) .... Take 1 Tablet By Mouth Once A Day 14)  Simvastatin 40 Mg Tabs (Simvastatin) .... Take 1 Tab By Mouth At Bedtime 15)  Cardizem La 360 Mg Xr24h-Tab (Diltiazem Hcl Coated Beads) .... Take 1 Tablet By Mouth Once A Day 16)  Albuterol Sulfate (2.5 Mg/50ml) 0.083% Nebu (Albuterol Sulfate) .... One Vial Per Nebulizer Qid Prn 17)  Dukes Magic Mouthwash .Marland Kitchen.. 10cc Three Times A Day Gargle and Swallow 18)  Fluconazole 150 Mg Tabs (Fluconazole) .... Take 1 Tablet By Mouth Once A Day 19)  Nystatin 100000 Unit/gm Powd (Nystatin) .... Apply Twice Daily To Groin For 10 Days, Then As Needed 20)  Hydroxyzine Hcl 25 Mg Tabs (Hydroxyzine Hcl) .... One Tab By Mouth At Bedtime Prn 21)  Vusion 0.25-15-81.35 % Oint (Miconazole-Zinc Oxide-Petrolat) .... Apply Topically Bid 22)  Exelon 4.6 Mg/24hr Pt24 (Rivastigmine) .... One Patch  Daily 23)  Promethazine Hcl 25 Mg Tabs (Promethazine Hcl) .... Take 1 Tablet By Mouth Two Times A Day As Needed 24)  Flexeril 5 Mg Tabs (Cyclobenzaprine Hcl) .... One By Mouth Q 8 Hrs As Needed Pain 25)  Vusion 0.25-15-81.35 % Oint (Miconazole-Zinc Oxide-Petrolat) .... Apply Twice Daily To Affected Area As Needed 26)  Tessalon Perles 100 Mg Caps (Benzonatate) .... One Cap By Mouth Three Times A Day 27)  Exelon 4.6 Mg/24hr Pt24 (Rivastigmine) .... Apply One Patchdaily To Upper Chest or Upper Arms and Back 28)  Bactrim Ds 800-160 Mg Tabs (Sulfamethoxazole-Trimethoprim) .Marland Kitchen.. 1 By Mouth Two Times A Day  Allergies (verified): 1)  ! Pcn 2)  ! Codeine 3)  ! *  Duragesic 4)  ! Levaquin 5)  ! Morphine 6)  ! Prednisone 7)  ! * Ciprofloxacin 8)  ! Prednisone  Past History:  Past medical history reviewed for relevance to current acute and chronic problems.  Past Medical History: Reviewed history from 05/06/2007 and no changes required.    DEPRESSION, MAJOR, RECURRENT, SEVERE, W/PSYCHOTIC BEHAVIOR (ICD-296.34) DEPRESSION, HX OF (ICD-V11.8) GERD (ICD-530.81) ANXIETY (ICD-300.00) OSTEOARTHRITIS (ICD-715.90) HYPERTENSION (ICD-401.9) OBESITY (ICD-278.00) ASTHMA (ICD-493.90)  Review of Systems General:  Denies chills and fever. CV:  Denies chest pain or discomfort and palpitations. Resp:  Denies shortness of breath. GU:  Complains of incontinence. Derm:  Complains of changes in color of skin.  Physical Exam  General:  Well-developed,well-nourished,in no acute distress; alert,appropriate and cooperative throughout examination Head:  Normocephalic and atraumatic without obvious abnormalities. No apparent alopecia or balding. Lungs:  Normal respiratory effort, chest expands symmetrically. Lungs are clear to auscultation, no crackles or wheezes. Heart:  Normal rate and regular rhythm. S1 and S2 normal without gallop, murmur, click, rub or other extra sounds. Pulses:  R posterior tibial normal, R dorsalis pedis normal, L posterior tibial normal, and L dorsalis pedis normal.   Extremities:  No PTE Skin:  erythema groin/skin folds with white exudate.   mild erythema bilat distal lower legs.  no warmth to touch. Psych:  Cognition and judgment appear intact. Alert and cooperative with normal attention span and concentration. No apparent delusions, illusions, hallucinations   Impression & Recommendations:  Problem # 1:  CELLULITIS (ICD-682.9) Assessment New  The following medications were removed from the medication list:    Bactrim Ds 800-160 Mg Tabs (Sulfamethoxazole-trimethoprim) .Marland Kitchen... 1 by mouth two times a day Her updated medication list  for this problem includes:    Cephalexin 500 Mg Tabs (Cephalexin) .Marland Kitchen... Take 1 three times a day x 10 days  Problem # 2:  CANDIDIASIS (ICD-112.9) Assessment: Unchanged Discussed with pt that her being overweight with skin folds & the constant moisture from her urinary incontinence make it very difficult to completely clear up her yeast infection.  That this is probably going to be a ongoing issue for her.  Problem # 3:  HYPERTENSION (ICD-401.9) Assessment: Unchanged  Her updated medication list for this problem includes:    Maxzide-25 37.5-25 Mg Tabs (Triamterene-hctz) .Marland Kitchen... Take 1 tablet by mouth once a day    Cardizem La 360 Mg Xr24h-tab (Diltiazem hcl coated beads) .Marland Kitchen... Take 1 tablet by mouth once a day  BP today: 120/70 Prior BP: 120/80 (07/03/2009)  Labs Reviewed: K+: 5.0 (01/10/2009) Creat: : 0.86 (01/10/2009)   Chol: 252 (01/10/2009)   HDL: 48 (01/10/2009)   LDL: 162 (01/10/2009)   TG: 208 (01/10/2009)  Complete Medication List: 1)  Hydrocodone-acetaminophen 7.5-750 Mg Tabs (Hydrocodone-acetaminophen) .... Take one tablet  every 6-8 hours 2)  Fosamax 70 Mg Tabs (Alendronate sodium) .... One tab by mouth every week 3)  Symbicort 80-4.5 Mcg/act Aero (Budesonide-formoterol fumarate) .... 2 puffs twice daily 4)  [facility]vs Fiber Laxative 625 Mg Tabs (Calcium polycarbophil) .... Take 1 tablet by mouth two times a day 5)  Oscal 500/200 D-3 500-200 Mg-unit Tabs (Calcium-vitamin d) .... Take 1 tablet by mouth three times a day 6)  Omeprazole 40 Mg Cpdr (Omeprazole) .... Take 1 tablet by mouth once a day 7)  Alprazolam 0.5 Mg Tbdp (Alprazolam) .Marland Kitchen.. 1 in the morning and 2 at bedtime 8)  Citalopram Hydrobromide 10 Mg Tabs (Citalopram hydrobromide) .... Take 1 tablet by mouth once a day 9)  Symbicort 80-4.5 Mcg/act Aero (Budesonide-formoterol fumarate) .... 2 pufs twice daily 10)  Proventil Hfa 108 (90 Base) Mcg/act Aers (Albuterol sulfate) .... 2 puffs three to four times daily as needed for  wheezing 11)  Vusion 0.25-15-81.35 % Oint (Miconazole-zinc oxide-petrolat) .... Apply to affected area two times a day 12)  Nystatin 100000 Unit/gm Crea (Nystatin) .... Apply to affected area twice daily as needed 13)  Maxzide-25 37.5-25 Mg Tabs (Triamterene-hctz) .... Take 1 tablet by mouth once a day 14)  Simvastatin 40 Mg Tabs (Simvastatin) .... Take 1 tab by mouth at bedtime 15)  Cardizem La 360 Mg Xr24h-tab (Diltiazem hcl coated beads) .... Take 1 tablet by mouth once a day 16)  Albuterol Sulfate (2.5 Mg/45ml) 0.083% Nebu (Albuterol sulfate) .... One vial per nebulizer qid prn 17)  Dukes Magic Mouthwash  .Marland Kitchen.. 10cc three times a day gargle and swallow 18)  Fluconazole 150 Mg Tabs (Fluconazole) .... Take 1 tablet by mouth once a day 19)  Nystatin 100000 Unit/gm Powd (Nystatin) .... Apply twice daily to groin for 10 days, then as needed 20)  Hydroxyzine Hcl 25 Mg Tabs (Hydroxyzine hcl) .... One tab by mouth at bedtime prn 21)  Vusion 0.25-15-81.35 % Oint (Miconazole-zinc oxide-petrolat) .... Apply topically bid 22)  Exelon 4.6 Mg/24hr Pt24 (Rivastigmine) .... One patch daily 23)  Promethazine Hcl 25 Mg Tabs (Promethazine hcl) .... Take 1 tablet by mouth two times a day as needed 24)  Flexeril 5 Mg Tabs (Cyclobenzaprine hcl) .... One by mouth q 8 hrs as needed pain 25)  Vusion 0.25-15-81.35 % Oint (Miconazole-zinc oxide-petrolat) .... Apply twice daily to affected area as needed 26)  Tessalon Perles 100 Mg Caps (Benzonatate) .... One cap by mouth three times a day 27)  Exelon 4.6 Mg/24hr Pt24 (Rivastigmine) .... Apply one patchdaily to upper chest or upper arms and back 28)  Cephalexin 500 Mg Tabs (Cephalexin) .... Take 1 three times a day x 10 days  Patient Instructions: 1)  Please schedule a follow-up appointment in 2 weeks. 2)  You need to lose weight. Consider a lower calorie diet and regular exercise.  3)  I have prescribed an antibiotic for you and medication for your yeast  infection. Prescriptions: FLUCONAZOLE 150 MG TABS (FLUCONAZOLE) Take 1 tablet by mouth once a day  #5 x 0   Entered and Authorized by:   Esperanza Sheets PA   Signed by:   Esperanza Sheets PA on 08/12/2009   Method used:   Electronically to        [facility]  [address] (retail)       [address]       [place], [place]  [address]       Ph: [phone]  Fax: [phone]   RxID:   [id number] CEPHALEXIN 500 MG TABS (CEPHALEXIN) take 1 three times a day x 10 days  #30 x 0   Entered and Authorized by:   Esperanza Sheets PA   Signed by:   Esperanza Sheets PA on 08/12/2009   Method used:   Electronically to        [facility]  [address] (retail)       [address]       [place], [place]  [address]       Ph: [phone]       Fax: [phone]   RxID:   [id number]

## 2010-06-23 NOTE — Progress Notes (Signed)
Summary: medicine  Phone Note Call from Patient   Summary of Call: needs for dr. Lodema Hong to okay her breathing medicine for albuterol. [facility] [phone] insurance will not cover until doc okay's it.  Initial call taken by: Rudene Anda,  May 27, 2009 1:47 PM  Follow-up for Phone Call        noted, pa request recieved Follow-up by: Worthy Keeler LPN,  May 27, 2009 1:53 PM

## 2010-06-23 NOTE — Assessment & Plan Note (Signed)
Summary: 2 wk xr rt wrist include lunate fossa view/blue med/bsf   Visit Type:  Follow-up Primary Provider:  Kerri Perches, MD  CC:  right wrist fracture.  History of Present Illness: I saw [name] in the office today for a 2 week  followup visit.  She is a 76 years old woman with the complaint of:  right wrist fracture.  Xrays today.  DOS 06/26/09 right wrist.    41 days   Procedure ORIF right wrist-volar plate   incision is clearing nicely   hand motion is normal   Allergies: 1)  ! Pcn 2)  ! Codeine 3)  ! * Duragesic 4)  ! Levaquin 5)  ! Morphine 6)  ! Prednisone 7)  ! * Ciprofloxacin 8)  ! Prednisone   Impression & Recommendations:  Problem # 1:  AFTERCARE HEALING TRAUMATIC FRACTURE UPPER ARM (ICD-V54.11) Assessment Improved  Orders: Post-Op Check (40102) Wrist x-ray complete, minimum 3 views (73110)  Problem # 2:  CLOSED FRACTURE OF LOWER END OF RADIUS WITH ULNA (ICD-813.44) Assessment: Improved  Orders: Post-Op Check (72536) Wrist x-ray complete, minimum 3 views (73110)-RIGHT plate fixation distal radius and alignment is normal   IMPR stable fixation right wrist   Patient Instructions: 1)  f/u in 6 weeks xarys with Lunate Fossa view

## 2010-06-23 NOTE — Progress Notes (Signed)
Summary: medication  Phone Note Call from Patient   Summary of Call: has a question on medication she states she got an antibiotic and she took that bottle and now the drug store called and said she had another bottle of medication and she hasn't called them for any refills do you know what the medication might be.   Follow-up for Phone Call        patient aware that she needs to pick up antibiotic Follow-up by: Adella Hare LPN,  November 26, 1608 1:24 PM
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
## 2010-06-23 NOTE — Medication Information (Signed)
Summary: Tax adviser   Imported By: Lind Guest 07/28/2009 15:02:26  _____________________________________________________________________  External Attachment:    Type:   Image     Comment:   External Document

## 2010-06-23 NOTE — Assessment & Plan Note (Signed)
Summary: POST OP 1/RT WRIST OTIF/SURG 06/26/09/[facility]   Visit Type:  Follow-up Primary Provider:  Kerri Perches, MD  CC:  post op 1 wrist.  History of Present Illness: I saw [name] in the office today for a followup visit.  She is a 76 years old woman with the complaint of:  DOS 06/26/09 right wrist.  Procedure ORIF right wrist.  Medication  Taking Oxycodone for pain one every 4 hrs.  POD 4.  DOING PRETTY GOOD HAD SOME NUMBNESS IT RESOLVD WITH REMOVING THE SPLINT   INCISION LOOKS GOOD   NEW VOLAR SPLINT + DRESSING   S/P OTIF WITH VOLAR PLATE DVR   RET 1 WEEK STAPLES OUT + X-RAYS        Allergies: 1)  ! Pcn 2)  ! Codeine 3)  ! * Duragesic 4)  ! Levaquin 5)  ! Morphine 6)  ! Prednisone 7)  ! * Ciprofloxacin 8)  ! Prednisone   Impression & Recommendations:  Problem # 1:  CLOSED FRACTURE OF LOWER END OF RADIUS WITH ULNA (ICD-813.44) Assessment Improved  Orders: Post-Op Check ([address]) Short Arm Splint Static (60454)  Medications Added to Medication List This Visit: 1)  Flexeril 5 Mg Tabs (Cyclobenzaprine hcl) .... One by mouth q 8 hrs as needed pain  Patient Instructions: 1)  1 week for staples out and xrays including lunate fossa view  Prescriptions: FLEXERIL 5 MG TABS (CYCLOBENZAPRINE HCL) one by mouth q 8 hrs as needed pain  #60 x 1   Entered and Authorized by:   Fuller Canada MD   Signed by:   Fuller Canada MD on 06/30/2009   Method used:   [facility]ed to ...       [facility]  [place] [address] (retail)       [address]       [place], [place]  [address]       Ph: [phone] or [phone]       Fax: [phone]   RxID:   [id number]

## 2010-06-23 NOTE — Progress Notes (Signed)
Summary: please advise  Phone Note Call from Patient   Summary of Call: Patient called and is concerned that she has the jerks when she is sleeping it has been going on for about 2 weeks.  She states she was dosed off a few minutes ago and  woke herself up because she jerked. She thinks something is wrong. Please advise. Initial call taken by: Curtis Sites,  December 18, 2009 2:52 PM  Follow-up for Phone Call        peoplesometimes have a sleep disorder where there is excessive jerking, she will need a formal sleep study to see if this is a problem, she may want to watch the situation a bit more, or she can be referred to asleep spwecialist eg Dr Gerilyn Pilgrim for further eval, if she wants this let me know, otherwise just sign off on what she wants pls Follow-up by: Syliva Overman MD,  December 19, 2009 8:31 AM  Additional Follow-up for Phone Call Additional follow up Details #1::        Patient aware Additional Follow-up by: Everitt Amber LPN,  December 19, 2009 1:44 PM     Appended Document: please advise She will see how it goes and will call back if she wants referral to have sleep study done

## 2010-06-23 NOTE — Progress Notes (Signed)
Summary: [facility]  Phone Note Call from Patient   Summary of Call: NEEDS [facility] [place] Initial call taken by: Lind Guest,  July 15, 2009 10:40 AM  Follow-up for Phone Call        Rx Called In Follow-up by: Adella Hare LPN,  July 15, 2009 10:50 AM    New/Updated Medications: TESSALON PERLES 100 MG CAPS (BENZONATATE) one cap by mouth three times a day Prescriptions: ALPRAZOLAM 0.5 MG TBDP (ALPRAZOLAM) 1 in the morning and 2 at bedtime  #90 x 2   Entered by:   Adella Hare LPN   Authorized by:   Syliva Overman MD   Signed by:   Adella Hare LPN on 04/54/0981   Method used:   Printed then faxed to ...       [facility]  [place] [address]       [address]       [place], [place]  [address]       Ph: [phone] or [phone]       Fax: [phone]   RxID:   [id number] TESSALON PERLES 100 MG CAPS (BENZONATATE) one cap by mouth three times a day  #30 x 0   Entered by:   Adella Hare LPN   Authorized by:   Syliva Overman MD   Signed by:   Adella Hare LPN on 25/95/6387   Method used:   [facility]        [facility]  [place] [address]       [address]       [place], [place]  [address]       Ph: [phone] or [phone]       Fax: [phone]   RxID:   [id number]
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
## 2010-06-23 NOTE — Letter (Signed)
Summary: rheumatology  rheumatology   Imported By: Lind Guest 04/15/2010 10:27:43  _____________________________________________________________________  External Attachment:    Type:   Image     Comment:   External Document

## 2010-06-23 NOTE — Letter (Signed)
Summary: Discharge Summary  Discharge Summary   Imported By: Lind Guest 02/11/2010 16:00:19  _____________________________________________________________________  External Attachment:    Type:   Image     Comment:   External Document

## 2010-06-23 NOTE — Letter (Signed)
Summary: outdcomes medical record request  outdcomes medical record request   Imported By: Jacklynn Ganong 04/23/2010 14:10:54  _____________________________________________________________________  External Attachment:    Type:   Image     Comment:   External Document

## 2010-06-23 NOTE — Progress Notes (Signed)
  Phone Note Call from Patient   Summary of Call: [facility]  She's taking alot of antibiotics for her hand and she thinks she has thrush in her mouth. It's raw and sore on the inside. Can she have something sent to her pharmacy? Initial call taken by: Everitt Amber LPN,  July 24, 2009 1:16 PM  Follow-up for Phone Call        I sent mycelex troche prescription to pharm for her. Follow-up by: Esperanza Sheets PA,  July 24, 2009 3:12 PM  Additional Follow-up for Phone Call Additional follow up Details #1::        Patient aware Additional Follow-up by: Everitt Amber LPN,  July 24, 2009 3:35 PM    New/Updated Medications: CLOTRIMAZOLE 10 MG TROC (CLOTRIMAZOLE) dissolve 1 slowly in mouth 5x daily Prescriptions: CLOTRIMAZOLE 10 MG TROC (CLOTRIMAZOLE) dissolve 1 slowly in mouth 5x daily  #35 x 1   Entered and Authorized by:   Esperanza Sheets PA   Signed by:   Esperanza Sheets PA on 07/24/2009   Method used:   [facility]ically to        [facility] [address] [facility]ret[facility]       [address]       [place], [place]  [address]       Ph: [phone] or [phone]       Fax: [phone]   RxID:   [id number]

## 2010-06-23 NOTE — Assessment & Plan Note (Signed)
Summary: refills, muscle spasm?- room 1   Vital Signs:  Patient profile:   76 year old female Menstrual status:  hysterectomy Height:      63 inches Weight:      231.25 pounds BMI:     41.11 O2 Sat:      94 % on Room air Pulse rate:   67 / minute Resp:     16 per minute BP sitting:   116 / 60  (left arm)  Vitals Entered By: Adella Hare LPN (January 02, 2010 10:46 AM) CC: wants refills on xanax and celexa and states muscles have been jumping Is Patient Diabetic? No Pain Assessment Patient in pain? no      Comments did not bring all meds to ov   Primary Provider:  Kerri Perches, MD  CC:  wants refills on xanax and celexa and states muscles have been jumping.  History of Present Illness: Pt presents today requesting med refills.  States she is overall doing well.  She has a little breathing problem if goes outside in the heat and humidity.  Will use albuterol before and this helps.  If stays inside in the airconditioning no problems. She would like a refill of Tessalon.  She uses this as needed. Also needs a refill of her Xanax.  Pt is taking her Celexa daily and seems to be doing well with this.  Pt states she needs a refill of her muscle relaxer.  She gets spasms in her back at night while trying to sleep and she is out of them.  She has used Flexeril 5 mg in the past per her med list.  Pt states her husb is concerned that she may have gout. She has some redness in her LLE for "awhile".  Allergies (verified): 1)  ! Pcn 2)  ! Codeine 3)  ! * Duragesic 4)  ! Levaquin 5)  ! Morphine 6)  ! Prednisone 7)  ! * Ciprofloxacin 8)  ! Prednisone  Past History:  Past medical history reviewed for relevance to current acute and chronic problems.  Past Medical History: Reviewed history from 05/06/2007 and no changes required.    DEPRESSION, MAJOR, RECURRENT, SEVERE, W/PSYCHOTIC BEHAVIOR (ICD-296.34) DEPRESSION, HX OF (ICD-V11.8) GERD (ICD-530.81) ANXIETY  (ICD-300.00) OSTEOARTHRITIS (ICD-715.90) HYPERTENSION (ICD-401.9) OBESITY (ICD-278.00) ASTHMA (ICD-493.90)  Review of Systems General:  Denies chills and fever. CV:  Denies chest pain or discomfort and palpitations. Resp:  Complains of shortness of breath and wheezing; denies cough. Derm:  Complains of changes in color of skin. Psych:  Complains of anxiety; denies depression.  Physical Exam  General:  Well-developed,well-nourished,in no acute distress; alert,appropriate and cooperative throughout examination Head:  Normocephalic and atraumatic without obvious abnormalities. No apparent alopecia or balding. Ears:  External ear exam shows no significant lesions or deformities.  Otoscopic examination reveals clear canals, tympanic membranes are intact bilaterally without bulging, retraction, inflammation or discharge. Hearing is grossly normal bilaterally. Nose:  External nasal examination shows no deformity or inflammation. Nasal mucosa are pink and moist without lesions or exudates. Mouth:  Oral mucosa and oropharynx without lesions or exudates.   Neck:  No deformities, masses, or tenderness noted. Lungs:  Normal respiratory effort, chest expands symmetrically. Lungs are clear to auscultation, no crackles or wheezes. Heart:  Normal rate and regular rhythm. S1 and S2 normal without gallop, murmur, click, rub or other extra sounds. Pulses:  L posterior tibial normal and L dorsalis pedis normal.   Extremities:  trace left pedal edema and  trace right pedal edema.   Neurologic:  Ambulates with walker Skin:  mild erythema and warmth to touch anterior LLE.  no  open wounds, drainage, etc. Cervical Nodes:  No lymphadenopathy noted Psych:  normally interactive, good eye contact, not anxious appearing, and not depressed appearing.     Impression & Recommendations:  Problem # 1:  ASTHMA (ICD-493.90) Assessment Comment Only  The following medications were removed from the medication list:     Symbicort 80-4.5 Mcg/act Aero (Budesonide-formoterol fumarate) .Marland Kitchen... 2 puffs twice daily Her updated medication list for this problem includes:    Symbicort 80-4.5 Mcg/act Aero (Budesonide-formoterol fumarate) .Marland Kitchen... 2 pufs twice daily    Proventil Hfa 108 (90 Base) Mcg/act Aers (Albuterol sulfate) .Marland Kitchen... 2 puffs three to four times daily as needed for wheezing    Albuterol Sulfate (2.5 Mg/48ml) 0.083% Nebu (Albuterol sulfate) ..... One vial per nebulizer qid prn  Problem # 2:  CELLULITIS (ICD-682.9) Assessment: New  Her updated medication list for this problem includes:    Sulfamethoxazole-tmp Ds 800-160 Mg Tabs (Sulfamethoxazole-trimethoprim) .Marland Kitchen... Take 1 two times a day x 10 days for the infection in your leg  Problem # 3:  ANXIETY (ICD-300.00) Assessment: Comment Only  Her updated medication list for this problem includes:    Alprazolam 0.5 Mg Tbdp (Alprazolam) .Marland Kitchen... 1 in the morning and 2 at bedtime    Hydroxyzine Hcl 25 Mg Tabs (Hydroxyzine hcl) ..... One tab by mouth at bedtime prn    Citalopram Hydrobromide 10 Mg Tabs (Citalopram hydrobromide) .Marland Kitchen... Take 1 daily for depression  Problem # 4:  HYPERTENSION (ICD-401.9) Assessment: Comment Only  Her updated medication list for this problem includes:    Cardizem La 360 Mg Xr24h-tab (Diltiazem hcl coated beads) .Marland Kitchen... Take 1 tablet by mouth once a day  BP today: 116/60 Prior BP: 128/84 (11/20/2009)  Labs Reviewed: K+: 4.3 (11/20/2009) Creat: : 0.69 (11/20/2009)   Chol: 216 (11/20/2009)   HDL: 48 (11/20/2009)   LDL: 137 (11/20/2009)   TG: 153 (11/20/2009)  Complete Medication List: 1)  Hydrocodone-acetaminophen 7.5-750 Mg Tabs (Hydrocodone-acetaminophen) .... Take one tablet every 6-8 hours 2)  Alprazolam 0.5 Mg Tbdp (Alprazolam) .Marland Kitchen.. 1 in the morning and 2 at bedtime 3)  Symbicort 80-4.5 Mcg/act Aero (Budesonide-formoterol fumarate) .... 2 pufs twice daily 4)  Proventil Hfa 108 (90 Base) Mcg/act Aers (Albuterol sulfate) .... 2  puffs three to four times daily as needed for wheezing 5)  Cardizem La 360 Mg Xr24h-tab (Diltiazem hcl coated beads) .... Take 1 tablet by mouth once a day 6)  Albuterol Sulfate (2.5 Mg/6ml) 0.083% Nebu (Albuterol sulfate) .... One vial per nebulizer qid prn 7)  Nystatin 100000 Unit/gm Powd (Nystatin) .... Apply twice daily to groin for 10 days, then as needed 8)  Hydroxyzine Hcl 25 Mg Tabs (Hydroxyzine hcl) .... One tab by mouth at bedtime prn 9)  Cyclobenzaprine Hcl 5 Mg Tabs (Cyclobenzaprine hcl) .... Take 1 at bedtime as needed for muscle spasms 10)  Vusion 0.25-15-81.35 % Oint (Miconazole-zinc oxide-petrolat) .... Apply twice daily to affected area as needed 11)  Exelon 4.6 Mg/24hr Pt24 (Rivastigmine) .... Apply one patchdaily to upper chest or upper arms and back 12)  Clotrimazole 1 % Crea (Clotrimazole) .... Apply two times a day as needed 13)  Citalopram Hydrobromide 10 Mg Tabs (Citalopram hydrobromide) .... Take 1 daily for depression 14)  Pantoprazole Sodium 40 Mg Tbec (Pantoprazole sodium) .... Take 1 daily for heartburn 15)  Pravastatin Sodium 20 Mg Tabs (Pravastatin sodium) .... Take  1 tab by mouth at bedtime 16)  Benzonatate 100 Mg Caps (Benzonatate) .... Take 1 every 8 hrs as needed for cough 17)  Sulfamethoxazole-tmp Ds 800-160 Mg Tabs (Sulfamethoxazole-trimethoprim) .... Take 1 two times a day x 10 days for the infection in your leg  Patient Instructions: 1)  Keep your next appt with Dr Lodema Hong 2)  I have refilled your muscle relaxer, you cough pill and your Xanax. 3)  I have prescribed an antibiotic for your leg. 4)  If you have increased redness or swelling call the office. Prescriptions: SULFAMETHOXAZOLE-TMP DS 800-160 MG TABS (SULFAMETHOXAZOLE-TRIMETHOPRIM) take 1 two times a day x 10 days for the infection in your leg  #20 x 0   Entered and Authorized by:   Esperanza Sheets PA   Signed by:   Esperanza Sheets PA on 01/02/2010   Method used:   [facility]  [place] [address]       [address]       [place], [place]  [address]       Ph: [phone] or [phone]       Fax: [phone]   RxID:   [id number] ALPRAZOLAM 0.5 MG TBDP (ALPRAZOLAM) 1 in the morning and 2 at bedtime  #90 x 2   Entered and Authorized by:   Esperanza Sheets PA   Signed by:   Esperanza Sheets PA on 01/02/2010   Method used:   Printed then faxed to ...       [facility]  [place] [address] [facility]ail[facility]       [address]       [place], [place]  [address]       Ph: [phone] or [phone]       Fax: [phone]   RxID:   [id number] BENZONATATE 100 MG CAPS (BENZONATATE) take 1 every 8 hrs as needed for cough  #30 x 1   Entered and Authorized by:   Esperanza Sheets PA   Signed by:   Esperanza Sheets PA on 01/02/2010   Method used:   [facility]        [facility]  [place] [address]       [address]       [place], [place]  [address]       Ph: [phone] or [phone]       Fax: [phone]   RxID:   [id number] CYCLOBENZAPRINE HCL 5 MG TABS (CYCLOBENZAPRINE HCL) take 1 at bedtime as needed for muscle spasms  #30 x 1   Entered and Authorized by:   Esperanza Sheets PA   Signed by:   Esperanza Sheets PA on 01/02/2010   Method used:   [facility]        [facility]  [place] [address] [facility]       [address]       [place], [place]  [address]       Ph: [phone] or [phone]       Fax: [phone]   RxID:   [id number]

## 2010-06-23 NOTE — Assessment & Plan Note (Signed)
Summary: office visit   Vital Signs:  Patient profile:   76 year old female Menstrual status:  hysterectomy Height:      63 inches Weight:      227.25 pounds BMI:     40.40 Pulse rate:   85 / minute Pulse rhythm:   regular Resp:     16 per minute BP sitting:   120 / 80  (left arm)  Vitals Entered By: Worthy Keeler LPN (June 10, 2009 11:46 AM)  Nutrition Counseling: Patient's BMI is greater than 25 and therefore counseled on weight management options. CC: vaginal itching Is Patient Diabetic? No Pain Assessment Patient in pain? no        Primary Care Provider:  Kerri Perches, MD  CC:  vaginal itching.  History of Present Illness: pt reprots groin rash, is slightly better but not where she would want it to be. she has used meds prescribed, and is anxious stting she is still scratching alot at night , adf she feels as hough the area involved has spread to the perianal area. sHE DENIES VAGINAL D/C OR URINary symptoms.  Allergies: 1)  ! Pcn 2)  ! Codeine 3)  ! * Duragesic 4)  ! Levaquin 5)  ! Morphine 6)  ! Prednisone 7)  ! * Ciprofloxacin 8)  ! Prednisone  Review of Systems      See HPI General:  Denies chills and fever. ENT:  Denies hoarseness and nasal congestion. CV:  Denies chest pain or discomfort and palpitations. GI:  Denies abdominal pain, constipation, diarrhea, nausea, and vomiting. GU:  Denies dysuria and urinary frequency. Derm:  See HPI. Psych:  Complains of anxiety and depression; denies suicidal thoughts/plans, thoughts of violence, and unusual visions or sounds. Heme:  Denies abnormal bruising and bleeding.  Physical Exam  General:  Well-developed,obese,in no acute distress; alert,appropriate and cooperative throughout examination HEENT: No facial asymmetry,  EOMI, No sinus tenderness, TM's Clear, oropharynx  pink and moist.   Chest: Clear to auscultation bilaterally.decreased air entry bilaterally  CVS: S1, S2, No murmurs, No S3.   Abd: Soft, Nontender.  [id number]  ROM spine, hips, shoulders and knees.  Ext: No edema.   CNS: CN 2-12 intact, power tone and sensation normal throughout.   Skin: erythematous rash in groin improved, but not completely resolved, mild perianal erythema Psych: Good eye contact, normal affect.  Memory intact, not anxious or depressed appearing.    Impression & Recommendations:  Problem # 1:  CANDIDIASIS (ICD-112.9) Assessment Improved  Problem # 2:  TINEA CRURIS (ICD-110.3) Assessment: Comment Only vusion prescribed and pt given handwritten script, she is to call if symptoms worsen  Problem # 3:  ANAL PRURITUS (ICD-698.0) Assessment: Deteriorated hydroxyzine 12 bedtime as needed script given to pt  Complete Medication List: 1)  Hydrocodone-acetaminophen 7.5-750 Mg Tabs (Hydrocodone-acetaminophen) .... Take one tablet every 6-8 hours 2)  Fosamax 70 Mg Tabs (Alendronate sodium) .... One tab by mouth every week 3)  Symbicort 80-4.5 Mcg/act Aero (Budesonide-formoterol fumarate) .... 2 puffs twice daily 4)  [facility] Fiber Laxative 625 Mg Tabs (Calcium polycarbophil) .... Take 1 tablet by mouth two times a day 5)  Oscal 500/200 D-3 500-200 Mg-unit Tabs (Calcium-vitamin d) .... Take 1 tablet by mouth three times a day 6)  Omeprazole 40 Mg Cpdr (Omeprazole) .... Take 1 tablet by mouth once a day 7)  Alprazolam 0.5 Mg Tbdp (Alprazolam) .Marland Kitchen.. 1 in the morning and 2 at bedtime 8)  Citalopram Hydrobromide 10 Mg Tabs (Citalopram  hydrobromide) .... Take 1 tablet by mouth once a day 9)  Symbicort 80-4.5 Mcg/act Aero (Budesonide-formoterol fumarate) .... 2 pufs twice daily 10)  Proventil Hfa 108 (90 Base) Mcg/act Aers (Albuterol sulfate) .... 2 puffs three to four times daily as needed for wheezing 11)  Vusion 0.25-15-81.35 % Oint (Miconazole-zinc oxide-petrolat) .... Apply to affected area two times a day 12)  Nystatin 100000 Unit/gm Crea (Nystatin) .... Apply to affected area twice daily as needed 13)   Maxzide-25 37.5-25 Mg Tabs (Triamterene-hctz) .... Take 1 tablet by mouth once a day 14)  Simvastatin 40 Mg Tabs (Simvastatin) .... Take 1 tab by mouth at bedtime 15)  Cardizem La 360 Mg Xr24h-tab (Diltiazem hcl coated beads) .... Take 1 tablet by mouth once a day 16)  Albuterol Sulfate (2.5 Mg/47ml) 0.083% Nebu (Albuterol sulfate) .... One vial per nebulizer qid prn 17)  Septra Ds 800-160 Mg Tabs (Sulfamethoxazole-trimethoprim) .... Take 1 tablet by mouth two times a day 18)  Tessalon Perles 100 Mg Caps (Benzonatate) .... Take 1 capsule by mouth three times a day 19)  Fluconazole 150 Mg Tabs (Fluconazole) .... Take 1 tablet by mouth once a day as needed 20)  Lomotil 2.5-0.025 Mg Tabs (Diphenoxylate-atropine) .... Take 1 tablet by mouth four times a day 21)  Dukes Magic Mouthwash  .Marland Kitchen.. 10cc three times a day gargle and swallow 22)  Fluconazole 150 Mg Tabs (Fluconazole) .... Take 1 tablet by mouth once a day 23)  Nystatin 100000 Unit/gm Powd (Nystatin) .... Apply twice daily to groin for 10 days, then as needed 24)  Hydroxyzine Hcl 25 Mg Tabs (Hydroxyzine hcl) .... One tab by mouth at bedtime prn 25)  Vusion 0.25-15-81.35 % Oint (Miconazole-zinc oxide-petrolat) .... Apply topically bid  Patient Instructions: 1)  CPE as before, 2)  New cream vusion ids prescribed for the rash. 3)  If nio better by Thursday, pls call for me to refer  you to dermatology.  Prescriptions: VUSION 0.25-15-81.35 % OINT (MICONAZOLE-ZINC OXIDE-PETROLAT) apply topically bid  #30gm x 1   Entered by:   Worthy Keeler LPN   Authorized by:   Syliva Overman MD   Signed by:   Syliva Overman MD on 06/11/2009   Method used:   Handwritten   RxID:   [id number] HYDROXYZINE HCL 25 MG TABS (HYDROXYZINE HCL) one tab by mouth at bedtime prn  #20 x 0   Entered by:   Worthy Keeler LPN   Authorized by:   Syliva Overman MD   Signed by:   Syliva Overman MD on 06/11/2009   Method used:   Handwritten   RxID:    [id number]

## 2010-06-23 NOTE — Progress Notes (Signed)
  Phone Note From Pharmacy   Caller: Patient Summary of Call: patient would like somthing sent in for cramps in the leg [facility] broke [facility] Initial call taken by: Adella Hare LPN,  January 22, 2010 9:00 AM  Follow-up for Phone Call        advise no specificmed is available for leg cramps anymore, if it hurts I advise 1 tyleniol at night  Additional Follow-up for Phone Call Additional follow up Details #1::        Phone call completed Additional Follow-up by: Adella Hare LPN,  January 22, 2010 1:43 PM

## 2010-06-23 NOTE — Progress Notes (Signed)
Summary: LEFT MESSAGE  Phone Note Call from Patient   Summary of Call: WANTS TO SPEAK WITH NURSE LEFT MESSAGE Initial call taken by: Lind Guest,  April 14, 2010 10:32 AM  Follow-up for Phone Call        patient wants script for support hose Follow-up by: Adella Hare LPN,  April 15, 2010 3:47 PM  Additional Follow-up for Phone Call Additional follow up Details #1::        completed Additional Follow-up by: Syliva Overman MD,  April 20, 2010 12:32 PM
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
## 2010-06-23 NOTE — Letter (Signed)
Summary: Surgery order RTwrist  Surgery order RTwrist   Imported By: Cammie Sickle 07/08/2009 09:24:59  _____________________________________________________________________  External Attachment:    Type:   Image     Comment:   External Document

## 2010-06-23 NOTE — Progress Notes (Signed)
Summary: raw mouth  Phone Note Call from Patient   Summary of Call: mouth is raw and would like to get something called into pharm. [phone] Initial call taken by: Rudene Anda,  May 26, 2009 3:54 PM  Follow-up for Phone Call        sounds like thrush, can pt have something called in? Follow-up by: Everitt Amber,  May 27, 2009 9:05 AM  Additional Follow-up for Phone Call Additional follow up Details #1::        pls erx dukes magic mouthwash, 10cc three times daily , gargle , swish and swallow x 300cc Additional Follow-up by: Syliva Overman MD,  May 27, 2009 12:22 PM    Additional Follow-up for Phone Call Additional follow up Details #2::    verbal order to [facility]. PT AWARE Follow-up by: Everitt Amber,  May 27, 2009 1:12 PM  New/Updated Medications: * DUKES MAGIC MOUTHWASH 10cc three times a day gargle and swallow Prescriptions: DUKES MAGIC MOUTHWASH 10cc three times a day gargle and swallow  #300cc x 0   Entered by:   Everitt Amber   Authorized by:   Syliva Overman MD   Signed by:   Everitt Amber on 05/27/2009   Method used:   Telephoned to ...       [facility]  [place] [address] (retail)       [address]       [place], [place]  [address]       Ph: [phone] or [phone]       Fax: [phone]   RxID:   [id number]
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
## 2010-06-23 NOTE — Letter (Signed)
Summary: ASTHMA NOS  ASTHMA NOS   Imported By: Lind Guest 03/26/2010 14:09:55  _____________________________________________________________________  External Attachment:    Type:   Image     Comment:   External Document

## 2010-06-23 NOTE — Miscellaneous (Signed)
Summary: Pre-cert request out-patient procedure  Clinical Lists Changes  On 06/23/09, clinicals faxed to [facility], Fax #[phone], Local office Ph#[phone], [name]              re: out-patient surgery scheduled 06/26/09, [facility], OTIF RT wrist                                  ICD9 code [phone]                                   * Per Tamsen Roers, this procedure does not require pre-authorization                          since provider and facility are in network, and cleared for up to 48 observation, out-pt, without authorization needed.

## 2010-06-23 NOTE — Assessment & Plan Note (Signed)
Summary: depression- room 1   Vital Signs:  Patient profile:   76 year old female Menstrual status:  hysterectomy Height:      63 inches Weight:      229.25 pounds BMI:     40.76 O2 Sat:      92 % on Room air Pulse rate:   98 / minute Resp:     16 per minute BP sitting:   140 / 90  (left arm)  Vitals Entered By: Adella Hare LPN (September 17, 2009 11:24 AM) CC: depressed Is Patient Diabetic? No Pain Assessment Patient in pain? no      Comments patient did not bring medication   Primary Provider:  Kerri Perches, MD  CC:  depressed.  History of Present Illness: Pt is here today due to depression.  See 08-29-09 visit with Dr Lodema Hong.  Dr Lodema Hong arranged for pt to have a mental health eval done on 09-05-09.  Pt states she went, and they prescribed a medication to help her sleep.  She states "it will give me a heart attack"  and that her pharmacist told her she shouldn't take with because of the other medications she is on.  Pt states she is still not sleeping well.  Awakens during the night crying.  Cries easily during the day too.  Also has decreased appetite.    Allergies (verified): 1)  ! Pcn 2)  ! Codeine 3)  ! * Duragesic 4)  ! Levaquin 5)  ! Morphine 6)  ! Prednisone 7)  ! * Ciprofloxacin 8)  ! Prednisone   Complete Medication List: 1)  Hydrocodone-acetaminophen 7.5-750 Mg Tabs (Hydrocodone-acetaminophen) .... Take one tablet every 6-8 hours 2)  Fosamax 70 Mg Tabs (Alendronate sodium) .... One tab by mouth every week 3)  Symbicort 80-4.5 Mcg/act Aero (Budesonide-formoterol fumarate) .... 2 puffs twice daily 4)  Cvs Fiber Laxative 625 Mg Tabs (Calcium polycarbophil) .... Take 1 tablet by mouth two times a day 5)  Oscal 500/200 D-3 500-200 Mg-unit Tabs (Calcium-vitamin d) .... Take 1 tablet by mouth three times a day 6)  Alprazolam 0.5 Mg Tbdp (Alprazolam) .Marland Kitchen.. 1 in the morning and 2 at bedtime 7)  Symbicort 80-4.5 Mcg/act Aero (Budesonide-formoterol fumarate)  .... 2 pufs twice daily 8)  Proventil Hfa 108 (90 Base) Mcg/act Aers (Albuterol sulfate) .... 2 puffs three to four times daily as needed for wheezing 9)  Vusion 0.25-15-81.35 % Oint (Miconazole-zinc oxide-petrolat) .... Apply to affected area two times a day 10)  Nystatin 100000 Unit/gm Crea (Nystatin) .... Apply to affected area twice daily as needed 11)  Simvastatin 40 Mg Tabs (Simvastatin) .... Take 1 tab by mouth at bedtime 12)  Cardizem La 360 Mg Xr24h-tab (Diltiazem hcl coated beads) .... Take 1 tablet by mouth once a day 13)  Albuterol Sulfate (2.5 Mg/1ml) 0.083% Nebu (Albuterol sulfate) .... One vial per nebulizer qid prn 14)  Nystatin 100000 Unit/gm Powd (Nystatin) .... Apply twice daily to groin for 10 days, then as needed 15)  Hydroxyzine Hcl 25 Mg Tabs (Hydroxyzine hcl) .... One tab by mouth at bedtime prn 16)  Flexeril 5 Mg Tabs (Cyclobenzaprine hcl) .... One by mouth q 8 hrs as needed pain 17)  Vusion 0.25-15-81.35 % Oint (Miconazole-zinc oxide-petrolat) .... Apply twice daily to affected area as needed 18)  Exelon 4.6 Mg/24hr Pt24 (Rivastigmine) .... Apply one patchdaily to upper chest or upper arms and back 19)  Clotrimazole 1 % Crea (Clotrimazole) .... Apply two times a day as  needed 20)  Doxycycline Hyclate 100 Mg Caps (Doxycycline hyclate) .... Take 1 capsule by mouth two times a day 21)  Sm Triple Antibiotic [phone] Oint (Neomycin-bacitracin-polymyxin) .... Apply twice daily to red areas on legs  Patient Instructions: 1)  Please schedule a follow-up appointment in 1 month wit Dr Lodema Hong. 2)  I am going to check with the pharmacy regarding your Xanax.  Appended Document: depression- room 1     Primary Provider:  Kerri Perches, MD   History of Present Illness: Pt is here today due to depression.  See 08-29-09 visit with Dr Lodema Hong.  Dr Lodema Hong arranged for pt to have a mental health eval done on 09-05-09.  Pt states she went, and they prescribed a medication to  help her sleep.  She states "it will give me a heart attack"  and that her pharmacist told her she shouldn't take with because of the other medications she is on.  We called the pharmacy [facility]) & pt was prescribed Aricept 10 mg by Dr. Tilden Dome.  She did not start this medication.  Her next appt with him is in approx 6 mos but pt states she is not going back there.  I asked pt if she would rather see a different psychiatrist & she says that she's not seeing any other drs & running all over.  That she wants [place] to help her & if we're not gonna help her then she will find a different dr.  Rock Nephew states she is still not sleeping well.  Awakens during the night crying.  Cries easily during the day too.  Also has decreased appetite.  She states she was fine and all of this started when her Xanax dosage was lowered.  I advised pt that according to our chart she is on the same dosage that she has been on, but pt is insistent that she is not.  She brought a written list of her medications which also states Xanax 0.5 mg but pt states that the list is not correct that her bottle says less.  I phoned the pharmacy ( [facility]) & the pharmacy staff verified that she is still on Xanax 0.5 mg, but did get a pill from a different generic supplier so the tablet does look different from what she was previously taking.  She was also prev taking Citalopram.  Apparently she stopped taking this "awhile ago."  Pt denies any side effects or problems with it.  Previously tried increasing the dose of her Exelon patch but pt didnt tolerate this.  She states that this is what made her fall.      Allergies: 1)  ! Pcn 2)  ! Codeine 3)  ! * Duragesic 4)  ! Levaquin 5)  ! Morphine 6)  ! Prednisone 7)  ! * Ciprofloxacin 8)  ! Prednisone  Past History:  Past medical history reviewed for relevance to current acute and chronic problems.  Past Medical History: Reviewed history from 05/06/2007 and no changes  required.    DEPRESSION, MAJOR, RECURRENT, SEVERE, W/PSYCHOTIC BEHAVIOR (ICD-296.34) DEPRESSION, HX OF (ICD-V11.8) GERD (ICD-530.81) ANXIETY (ICD-300.00) OSTEOARTHRITIS (ICD-715.90) HYPERTENSION (ICD-401.9) OBESITY (ICD-278.00) ASTHMA (ICD-493.90)  Review of Systems Psych:  Complains of anxiety, depression, and easily tearful.  Physical Exam  General:  Well-developed,well-nourished,in no acute distress; alert,appropriate and cooperative throughout examination Head:  Normocephalic and atraumatic without obvious abnormalities. No apparent alopecia or balding. Psych:  good eye contact, tearful, agitated, and angry.   Additional Exam:  VISIT BASED ON TIME.  TOTAL TIME SPEND 35 MINUTES. TIME SPENT WITH PT IN OFFICE, CALLING PHARMACIES, DISCUSSION WITH DR SIMPSON, AND PHONE CALL TO PT & HUSBAND AFTER VISIT.   Impression & Recommendations:  Problem # 1:  DEMENTIA (ICD-294.8) Assessment Comment Only Advised pt during visit & again on phone after visit that she needs to f/u with psychiatrist.  Pt states she is not going to go to a physciatrist again. I also spoke to husb on the phone after the visit & advised him of the same information that I told [name].  I asked him if he could convince [name] to see the phychiatrist again, and he stated no, that he can't change her mind.  Problem # 2:  DEPRESSION, MAJOR, RECURRENT, SEVERE, W/PSYCHOTIC BEHAVIOR (ICD-296.34) Assessment: Comment Only Will restart Cilalopram.  Pt agrees with this. Advised pt that her Xanax dose is the same, just the pill looks different because it is made by a different company. Pt disagrees, "there ain't no way."  Complete Medication List: 1)  Hydrocodone-acetaminophen 7.5-750 Mg Tabs (Hydrocodone-acetaminophen) .... Take one tablet every 6-8 hours 2)  Fosamax 70 Mg Tabs (Alendronate sodium) .... One tab by mouth every week 3)  Symbicort 80-4.5 Mcg/act Aero (Budesonide-formoterol fumarate) .... 2 puffs twice daily 4)   Cvs Fiber Laxative 625 Mg Tabs (Calcium polycarbophil) .... Take 1 tablet by mouth two times a day 5)  Oscal 500/200 D-3 500-200 Mg-unit Tabs (Calcium-vitamin d) .... Take 1 tablet by mouth three times a day 6)  Alprazolam 0.5 Mg Tbdp (Alprazolam) .Marland Kitchen.. 1 in the morning and 2 at bedtime 7)  Symbicort 80-4.5 Mcg/act Aero (Budesonide-formoterol fumarate) .... 2 pufs twice daily 8)  Proventil Hfa 108 (90 Base) Mcg/act Aers (Albuterol sulfate) .... 2 puffs three to four times daily as needed for wheezing 9)  Vusion 0.25-15-81.35 % Oint (Miconazole-zinc oxide-petrolat) .... Apply to affected area two times a day 10)  Nystatin 100000 Unit/gm Crea (Nystatin) .... Apply to affected area twice daily as needed 11)  Simvastatin 40 Mg Tabs (Simvastatin) .... Take 1 tab by mouth at bedtime 12)  Cardizem La 360 Mg Xr24h-tab (Diltiazem hcl coated beads) .... Take 1 tablet by mouth once a day 13)  Albuterol Sulfate (2.5 Mg/60ml) 0.083% Nebu (Albuterol sulfate) .... One vial per nebulizer qid prn 14)  Nystatin 100000 Unit/gm Powd (Nystatin) .... Apply twice daily to groin for 10 days, then as needed 15)  Hydroxyzine Hcl 25 Mg Tabs (Hydroxyzine hcl) .... One tab by mouth at bedtime prn 16)  Flexeril 5 Mg Tabs (Cyclobenzaprine hcl) .... One by mouth q 8 hrs as needed pain 17)  Vusion 0.25-15-81.35 % Oint (Miconazole-zinc oxide-petrolat) .... Apply twice daily to affected area as needed 18)  Exelon 4.6 Mg/24hr Pt24 (Rivastigmine) .... Apply one patchdaily to upper chest or upper arms and back 19)  Clotrimazole 1 % Crea (Clotrimazole) .... Apply two times a day as needed 20)  Doxycycline Hyclate 100 Mg Caps (Doxycycline hyclate) .... Take 1 capsule by mouth two times a day 21)  Sm Triple Antibiotic [phone] Oint (Neomycin-bacitracin-polymyxin) .... Apply twice daily to red areas on legs 22)  Citalopram Hydrobromide 10 Mg Tabs (Citalopram hydrobromide) .... Take 1 daily for depression Prescriptions: CITALOPRAM  HYDROBROMIDE 10 MG TABS (CITALOPRAM HYDROBROMIDE) take 1 daily for depression  #30 x 5   Entered and Authorized by:   Esperanza Sheets PA   Signed by:   Esperanza Sheets PA on 09/17/2009   Method used:   [facility]  [facility]        [facility]  [place] [address] (retail)       [address]       [place], [place]  [address]       Ph: [phone] or [phone]       Fax: [phone]   RxID:   [id number]

## 2010-06-23 NOTE — Progress Notes (Signed)
Summary: RX  Phone Note Call from Patient   Summary of Call: [facility] [place] Initial call taken by: Lind Guest,  September 08, 2009 9:24 AM  Follow-up for Phone Call        Phone Call Completed, Rx Called In Follow-up by: Adella Hare LPN,  September 08, 2009 9:32 AM    Prescriptions: CLOTRIMAZOLE 1 % CREA (CLOTRIMAZOLE) apply two times a day as needed  #93month x 0   Entered by:   Adella Hare LPN   Authorized by:   Syliva Overman MD   Signed by:   Adella Hare LPN on 28/41/3244   Method used:   [facility]        [facility]  [place] [address] ([facility]ail)       [address] [place] [address]       [place], [place]  [address]       Ph: [phone] or [phone]       Fax: [phone]   RxID:   [id number]

## 2010-06-23 NOTE — Progress Notes (Signed)
Summary: nurse call  Phone Note Call from Patient   Summary of Call: pt would like to get phengren. can doc call her in some. sick on stomach. and chest hurting and don't want to go to hospital. Also losing her voice. wants to x-rays at the [facility] center. [phone] Initial call taken by: Rudene Anda,  June 23, 2009 3:02 PM  Follow-up for Phone Call        pt advised that phenergan has been sent instates no longer nauseated and does not need it, I advisedher not to fill it then. states she has no chest pain. SDtatesvshe is for surgery on her wrist since she recently fell in the bath and broke it Follow-up by: Syliva Overman MD,  June 23, 2009 5:30 PM    New/Updated Medications: PROMETHAZINE HCL 25 MG TABS (PROMETHAZINE HCL) Take 1 tablet by mouth two times a day as needed Prescriptions: PROMETHAZINE HCL 25 MG TABS (PROMETHAZINE HCL) Take 1 tablet by mouth two times a day as needed  #30 x 0   Entered and Authorized by:   Syliva Overman MD   Signed by:   Syliva Overman MD on 06/23/2009   Method used:   [facility]        [facility]  [place] [address] ([facility]       [address] [place] [address]       [place], [place]  [address]       Ph: [phone] or [phone]       Fax: [phone]   RxID:   [id number]
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
## 2010-06-23 NOTE — Assessment & Plan Note (Signed)
Summary: follow up - room 3   Vital Signs:  Patient profile:   76 year old female Menstrual status:  hysterectomy Height:      63 inches Weight:      223.75 pounds BMI:     39.78 O2 Sat:      92 % on Room air Pulse rate:   88 / minute Resp:     16 per minute BP sitting:   142 / 82  (left arm)  Vitals Entered By: Adella Hare LPN (Oct 16, 2009 10:11 AM) CC: follow-up visit Is Patient Diabetic? No Pain Assessment Patient in pain? no      Comments did not bring meds to ov   Primary Provider:  Kerri Perches, MD  CC:  follow-up visit.  History of Present Illness: Pt is here today for a check up.  States she saw dermatologist (Dr Charlton Haws).  Was dx with yeast infection and psoriasis.  She states it is improving with creams she prescribed.  Not itching and uncomfortable like she was before.  Pt states her depression is doing well.  Not crying like she was.  She is taking her medications and states they are working well for her.  She doesn't sleep well at night but this is chronic and unchanged.  She does nap during the day.  Pt does have heartburn daily.  She is using Tums.  She was on prescription PPIs in the past, and is uncertain why she stopped.  She states that Omeprazole didn't work well and upset her stomach. BM's are nl, no blood.  She is seeing orthopod re: knee arthritis.  Has had 1 knee replacement and states she may need to have the other one done.  She recently had an injection in her knee to help with her discomfort.  Hx of asthma. States has wheezing with increased humidity if she goes outside.  Otherwise is doing well.  She is using her Symbicort two times a day, and has albuterol for rescue.     Allergies (verified): 1)  ! Pcn 2)  ! Codeine 3)  ! * Duragesic 4)  ! Levaquin 5)  ! Morphine 6)  ! Prednisone 7)  ! * Ciprofloxacin 8)  ! Prednisone  Past History:  Past medical history reviewed for relevance to current acute and chronic  problems.  Past Medical History: Reviewed history from 05/06/2007 and no changes required.    DEPRESSION, MAJOR, RECURRENT, SEVERE, W/PSYCHOTIC BEHAVIOR (ICD-296.34) DEPRESSION, HX OF (ICD-V11.8) GERD (ICD-530.81) ANXIETY (ICD-300.00) OSTEOARTHRITIS (ICD-715.90) HYPERTENSION (ICD-401.9) OBESITY (ICD-278.00) ASTHMA (ICD-493.90)  Review of Systems General:  Denies chills and fever. CV:  Denies chest pain or discomfort and palpitations. Resp:  Complains of wheezing; denies cough and shortness of breath; HX OF ASTHMA, INTERMITTENT WHEEZING. GI:  Complains of indigestion; denies abdominal pain, bloody stools, change in bowel habits, nausea, and vomiting. GU:  Denies dysuria and urinary frequency. MS:  Complains of joint pain. Psych:  Complains of anxiety and depression; denies suicidal thoughts/plans, thoughts of violence, and thoughts /plans of harming others.  Physical Exam  General:  Well-developed,well-nourished,in no acute distress; alert,appropriate and cooperative throughout examination Head:  Normocephalic and atraumatic without obvious abnormalities. No apparent alopecia or balding. Ears:  External ear exam shows no significant lesions or deformities.  Otoscopic examination reveals clear canals, tympanic membranes are intact bilaterally without bulging, retraction, inflammation or discharge. Hearing is grossly normal bilaterally. Nose:  External nasal examination shows no deformity or inflammation. Nasal mucosa are pink and moist  without lesions or exudates. Mouth:  Oral mucosa and oropharynx without lesions or exudates.  Teeth in good repair. Neck:  No deformities, masses, or tenderness noted. Lungs:  Normal respiratory effort, chest expands symmetrically. Lungs are clear to auscultation, no crackles or wheezes. Heart:  Normal rate and regular rhythm. S1 and S2 normal without gallop, murmur, click, rub or other extra sounds. Cervical Nodes:  No lymphadenopathy noted Psych:   normally interactive, good eye contact, not anxious appearing, not depressed appearing, and not agitated.     Impression & Recommendations:  Problem # 1:  HYPERTENSION (ICD-401.9) Assessment Comment Only  Her updated medication list for this problem includes:    Cardizem La 360 Mg Xr24h-tab (Diltiazem hcl coated beads) .Marland Kitchen... Take 1 tablet by mouth once a day  BP today: 142/82 Prior BP: 140/90 (09/17/2009)  Labs Reviewed: K+: 5.0 (01/10/2009) Creat: : 0.86 (01/10/2009)   Chol: 252 (01/10/2009)   HDL: 48 (01/10/2009)   LDL: 162 (01/10/2009)   TG: 208 (01/10/2009)  Orders: T-CMP with estimated GFR ([address])  Problem # 2:  DEPRESSION, MAJOR, RECURRENT, SEVERE, W/PSYCHOTIC BEHAVIOR (ICD-296.34) Assessment: Improved  Problem # 3:  DEMENTIA (ICD-294.8) Assessment: Comment Only  Problem # 4:  GERD (ICD-530.81) Assessment: Deteriorated  Her updated medication list for this problem includes:    Pantoprazole Sodium 40 Mg Tbec (Pantoprazole sodium) .Marland Kitchen... Take 1 daily for heartburn  Problem # 5:  OSTEOARTHRITIS (ICD-715.90) Assessment: Comment Only Will continue f/u with orthopod as needed.  Her updated medication list for this problem includes:    Hydrocodone-acetaminophen 7.5-750 Mg Tabs (Hydrocodone-acetaminophen) .Marland Kitchen... Take one tablet every 6-8 hours  Problem # 6:  ASTHMA (ICD-493.90) Assessment: Unchanged  Her updated medication list for this problem includes:    Symbicort 80-4.5 Mcg/act Aero (Budesonide-formoterol fumarate) .Marland Kitchen... 2 puffs twice daily    Symbicort 80-4.5 Mcg/act Aero (Budesonide-formoterol fumarate) .Marland Kitchen... 2 pufs twice daily    Proventil Hfa 108 (90 Base) Mcg/act Aers (Albuterol sulfate) .Marland Kitchen... 2 puffs three to four times daily as needed for wheezing    Albuterol Sulfate (2.5 Mg/49ml) 0.083% Nebu (Albuterol sulfate) ..... One vial per nebulizer qid prn  Complete Medication List: 1)  Hydrocodone-acetaminophen 7.5-750 Mg Tabs (Hydrocodone-acetaminophen) ....  Take one tablet every 6-8 hours 2)  Fosamax 70 Mg Tabs (Alendronate sodium) .... One tab by mouth every week 3)  Symbicort 80-4.5 Mcg/act Aero (Budesonide-formoterol fumarate) .... 2 puffs twice daily 4)  Cvs Fiber Laxative 625 Mg Tabs (Calcium polycarbophil) .... Take 1 tablet by mouth two times a day 5)  Oscal 500/200 D-3 500-200 Mg-unit Tabs (Calcium-vitamin d) .... Take 1 tablet by mouth three times a day 6)  Alprazolam 0.5 Mg Tbdp (Alprazolam) .Marland Kitchen.. 1 in the morning and 2 at bedtime 7)  Symbicort 80-4.5 Mcg/act Aero (Budesonide-formoterol fumarate) .... 2 pufs twice daily 8)  Proventil Hfa 108 (90 Base) Mcg/act Aers (Albuterol sulfate) .... 2 puffs three to four times daily as needed for wheezing 9)  Vusion 0.25-15-81.35 % Oint (Miconazole-zinc oxide-petrolat) .... Apply to affected area two times a day 10)  Nystatin 100000 Unit/gm Crea (Nystatin) .... Apply to affected area twice daily as needed 11)  Simvastatin 40 Mg Tabs (Simvastatin) .... Take 1 tab by mouth at bedtime 12)  Cardizem La 360 Mg Xr24h-tab (Diltiazem hcl coated beads) .... Take 1 tablet by mouth once a day 13)  Albuterol Sulfate (2.5 Mg/10ml) 0.083% Nebu (Albuterol sulfate) .... One vial per nebulizer qid prn 14)  Nystatin 100000 Unit/gm Powd (Nystatin) .... Apply twice  daily to groin for 10 days, then as needed 15)  Hydroxyzine Hcl 25 Mg Tabs (Hydroxyzine hcl) .... One tab by mouth at bedtime prn 16)  Flexeril 5 Mg Tabs (Cyclobenzaprine hcl) .... One by mouth q 8 hrs as needed pain 17)  Vusion 0.25-15-81.35 % Oint (Miconazole-zinc oxide-petrolat) .... Apply twice daily to affected area as needed 18)  Exelon 4.6 Mg/24hr Pt24 (Rivastigmine) .... Apply one patchdaily to upper chest or upper arms and back 19)  Clotrimazole 1 % Crea (Clotrimazole) .... Apply two times a day as needed 20)  Doxycycline Hyclate 100 Mg Caps (Doxycycline hyclate) .... Take 1 capsule by mouth two times a day 21)  Sm Triple Antibiotic [phone] Oint  (Neomycin-bacitracin-polymyxin) .... Apply twice daily to red areas on legs 22)  Citalopram Hydrobromide 10 Mg Tabs (Citalopram hydrobromide) .... Take 1 daily for depression 23)  Pantoprazole Sodium 40 Mg Tbec (Pantoprazole sodium) .... Take 1 daily for heartburn  Other Orders: T-Lipid Profile ([address]-60454) T-CBC No Diff (09811-91478) T-TSH [phone]) T-Vitamin D (25-Hydroxy) [phone]) T- Hemoglobin A1C (28413-24401)  Patient Instructions: 1)  Please schedule a follow-up appointment in 3 months. 2)  I have prescribed a medication for your heartburn. 3)  I have ordered blood work for you to have drawn fasting. 4)  Continue your current medications.  I am glad your depression is doing better. Prescriptions: PANTOPRAZOLE SODIUM 40 MG TBEC (PANTOPRAZOLE SODIUM) take 1 daily for heartburn  #30 x 11   Entered and Authorized by:   Esperanza Sheets PA   Signed by:   Esperanza Sheets PA on 10/16/2009   Method used:   [facility]  [place] [address]       [address]       [place], [place]  [address]       Ph: [phone] or [phone]       Fax: [phone]   RxID:   [id number]

## 2010-06-23 NOTE — Progress Notes (Signed)
Summary: speak nurse   Phone Note Call from Patient   Summary of Call: pt needs to speak with nurse about fungus. its driving her crazy [phone] Initial call taken by: Rudene Anda,  Oct 08, 2009 11:03 AM  Follow-up for Phone Call        itching really bad on her bottom. Had it for a month or so. The cream you gave her isn't working anymore. She has washed the area twice today and it is still giving her a fit. Wants to know if there is something else you can give her. It keeps her up at night itching and burning so bad. Follow-up by: Everitt Amber LPN,  Oct 08, 2009 4:06 PM  Additional Follow-up for Phone Call Additional follow up Details #1::        sincethe cream is not working I strongly recommend she see a dermatologist about Robley Fries which is persitent and will not go away, pls refer her to derm Additional Follow-up by: Syliva Overman MD,  Oct 09, 2009 1:11 PM    Additional Follow-up for Phone Call Additional follow up Details #2::    pt has appt with dr. hall on 10/13/2009 9:30. pt notified  Follow-up by: Rudene Anda,  Oct 09, 2009 1:42 PM

## 2010-06-24 ENCOUNTER — Telehealth: Payer: Self-pay | Admitting: Family Medicine

## 2010-06-25 ENCOUNTER — Telehealth: Payer: Self-pay | Admitting: Family Medicine

## 2010-06-25 NOTE — Letter (Signed)
Summary: [facility]  [facility]   Imported By: Lind Guest 06/04/2010 08:27:06  _____________________________________________________________________  External Attachment:    Type:   Image     Comment:   External Document

## 2010-06-25 NOTE — Progress Notes (Signed)
Summary: medicine and cold  Phone Note Call from Patient   Summary of Call: pt can't take depression meds. needs her xanax back. please call her. [phone] husband stated she also had aq cold.  Initial call taken by: Rudene Anda,  June 08, 2010 9:22 AM  Follow-up for Phone Call        states the depression med makes her throw up, wants xanax back  states she has asthma and is wheezing and needs some cough pills Follow-up by: Adella Hare LPN,  June 08, 2010 10:57 AM  Additional Follow-up for Phone Call Additional follow up Details #1::        pls ensure the xanax is refilled when due, this was never stopped, explain to her , encourage her  to try taking HALF of the depression  pil instead of a whole , see if she can tolerate that better.  Advise will be sending in a prednisone dose pack and tessalon perles for her wheezing , pls also refill her proventil and symbicort, let her know Additional Follow-up by: Syliva Overman MD,  June 08, 2010 12:33 PM    Additional Follow-up for Phone Call Additional follow up Details #2::    NOPTE pLS send in the cancel script and call to cancel th prednisone dose pack pt lists this as an lallergy, I am removing iit also, this is iMPT Follow-up by: Syliva Overman MD,  June 08, 2010 12:38 PM  Additional Follow-up for Phone Call Additional follow up Details #3:: Details for Additional Follow-up Action Taken: predisone d/c'd at pharmacy. Spoke with them directly and faxed note. Inhalers and xanax refilled Additional Follow-up by: Everitt Amber LPN,  June 08, 2010 12:58 PM  Patient aware  Prescriptions: BENZONATATE 100 MG CAPS (BENZONATATE) take 1 every 8 hrs as needed for cough  #30 x 0   Entered by:   Everitt Amber LPN   Authorized by:   Syliva Overman MD   Signed by:   Everitt Amber LPN on 29/56/2130   Method used:   [facility]        [facility]  [place] [address] [facility]       [address]       [place], [place]  [address]     Ph: [phone] or [phone]       Fax: [phone]   RxID:   [id number] PROVENTIL HFA 108 (90 BASE) MCG/ACT AERS (ALBUTEROL SULFATE) 2 puffs three to four times daily as needed for wheezing  #1 x 2   Entered by:   Everitt Amber LPN   Authorized by:   Syliva Overman MD   Signed by:   Everitt Amber LPN on 95/18/8416   Method used:   [facility]        [facility]  [place] [address]       [address]       [place], [place]  [address]       Ph: [phone] or [phone]       Fax: [phone]   RxID:   [id number] SYMBICORT 80-4.5 MCG/ACT AERO (BUDESONIDE-FORMOTEROL FUMARATE) 2 pufs twice daily  #10.2 Inhaler x 2   Entered by:   Everitt Amber LPN   Authorized by:   Syliva Overman MD   Signed by:   Everitt Amber LPN on 94/85/4627   Method used:   [facility]        [facility]  [place] [phone][facility]retail)  [address]       [place], [place]  [address]       Ph: [phone] or [phone]       Fax: [phone]   RxID:   [id number] ALPRAZOLAM 0.5 MG TBDP (ALPRAZOLAM) 1 in the morning and 2 at bedtime  #90 x 1   Entered by:   Everitt Amber LPN   Authorized by:   Syliva Overman MD   Signed by:   Everitt Amber LPN on 27/25/3664   Method used:   Printed then faxed to ...       [facility]  [place] [address] (retail)       [address]       [place], [place]  [address]       Ph: [phone] or [phone]       Fax: [phone]   RxID:   [id number]

## 2010-06-25 NOTE — Assessment & Plan Note (Signed)
Summary: celuitis legs are red   Vital Signs:  Patient profile:   76 year old female Menstrual status:  hysterectomy Height:      63 inches Weight:      233.50 pounds BMI:     41.51 Pulse rate:   77 / minute Resp:     16 per minute BP sitting:   130 / 90  (left arm)  Vitals Entered By: Adella Hare LPN (May 12, 2010 11:06 AM) CC: both lower legs red with some swelling Is Patient Diabetic? No Comments did not bring meds to ov   Primary Care Suhaan Perleberg:  Kerri Perches, MD  CC:  both lower legs red with some swelling.  History of Present Illness: Reports  that she has not been doing very well. she has a 3 week h/o progressive swelling and redness of both lower extremeties Denies recent fever or chills. Denies sinus pressure, nasal congestion , ear pain or sore throat. Denies chest congestion, or cough productive of sputum. Denies chest pain, palpitations, PND, orthopnea or leg swelling. Denies abdominal pain, nausea, vomitting, diarrhea or constipation. Denies change in bowel movements or bloody stool. Denies dysuria , frequency, incontinence or hesitancy.  Denies headaches, vertigo, seizures.  Denies  rash, lesions, or itch.     Allergies (verified): 1)  ! Pcn 2)  ! Codeine 3)  ! * Duragesic 4)  ! Levaquin 5)  ! Morphine 6)  ! Prednisone 7)  ! * Ciprofloxacin 8)  ! Prednisone  Review of Systems      See HPI General:  Complains of fatigue. Eyes:  Denies discharge, eye pain, and red eye. MS:  Complains of joint pain and stiffness; iknees improved with injections given , but left hip worse . Derm:  Complains of rash; 3 week h/o redness swellin and pain in both legs. Neuro:  Complains of difficulty with concentration, poor balance, and weakness; denies headaches, seizures, and sensation of room spinning. Psych:  Complains of anxiety, depression, and mental problems; denies suicidal thoughts/plans, thoughts of violence, and unusual visions or sounds. Endo:   Denies cold intolerance, excessive hunger, excessive thirst, and heat intolerance. Heme:  Denies abnormal bruising and bleeding. Allergy:  Denies hives or rash and itching eyes.  Physical Exam  General:  Well-developed,obese,in no acute distress; alert,appropriate and cooperative throughout examination HEENT: No facial asymmetry,  EOMI, No sinus tenderness, TM's Clear, oropharynx  pink and moist.   Chest: Clear to auscultation bilaterally.  CVS: S1, S2, No murmurs, No S3.   Abd: Soft, Nontender.  MS: decreased ROM spine, hips, shoulders and knees.  Ext:2 plus edema.   CNS: CN 2-12 intact, power tone and sensation normal throughout.   Skin: Intact,erythema of both legs and mild warmth Psych: Good eye contact, normal affect.  Memory loss, not anxious or depressed appearing.    Impression & Recommendations:  Problem # 1:  BACK PAIN (ICD-724.5) Assessment Deteriorated  Her updated medication list for this problem includes:    Hydrocodone-acetaminophen 7.5-750 Mg Tabs (Hydrocodone-acetaminophen) .Marland Kitchen... Take one tablet every 6-8 hours    Cyclobenzaprine Hcl 5 Mg Tabs (Cyclobenzaprine hcl) .Marland Kitchen... Take 1 at bedtime as needed for muscle spasms  Orders: Ketorolac-Toradol 15mg  (U9811) Admin of Therapeutic Inj  intramuscular or subcutaneous (91478)  Problem # 2:  ASTHMA (ICD-493.90) Assessment: Improved  Her updated medication list for this problem includes:    Symbicort 80-4.5 Mcg/act Aero (Budesonide-formoterol fumarate) .Marland Kitchen... 2 pufs twice daily    Proventil Hfa 108 (90 Base) Mcg/act Aers (  Albuterol sulfate) .Marland Kitchen... 2 puffs three to four times daily as needed for wheezing    Albuterol Sulfate (2.5 Mg/26ml) 0.083% Nebu (Albuterol sulfate) ..... One vial per nebulizer four times a day  as needed  Problem # 3:  CELLULITIS (ICD-682.9) Assessment: Comment Only  The following medications were removed from the medication list:    Septra Ds 800-160 Mg Tabs (Sulfamethoxazole-trimethoprim) .Marland Kitchen...  Take 1 tablet by mouth two times a day Her updated medication list for this problem includes:    Doxycycline Hyclate 100 Mg Caps (Doxycycline hyclate) .Marland Kitchen... Take 1 capsule by mouth two times a day  Orders: Medicare Electronic Prescription [phone])  Problem # 4:  LEG EDEMA, BILATERAL (ICD-782.3) Assessment: Deteriorated  Her updated medication list for this problem includes:    Furosemide 20 Mg Tabs (Furosemide) .Marland Kitchen... Take 1 tablet by mouth once a day  as needed for  leg swelling  Orders: Furosemide- Lasix Injection (U0454)  Problem # 5:  HYPERTENSION (ICD-401.9) Assessment: Deteriorated  Her updated medication list for this problem includes:    Cardizem La 360 Mg Xr24h-tab (Diltiazem hcl coated beads) .Marland Kitchen... Take 1 tablet by mouth once a day    Furosemide 20 Mg Tabs (Furosemide) .Marland Kitchen... Take 1 tablet by mouth once a day  as needed for  leg swelling  BP today: 130/90 Prior BP: 100/80 (03/16/2010)  Labs Reviewed: K+: 4.3 (11/20/2009) Creat: : 0.69 (11/20/2009)   Chol: 216 (11/20/2009)   HDL: 48 (11/20/2009)   LDL: 137 (11/20/2009)   TG: 153 (11/20/2009)  Problem # 6:  OBESITY (ICD-278.00) Assessment: Unchanged  Ht: 63 (05/12/2010)   Wt: 233.50 (05/12/2010)   BMI: 41.51 (05/12/2010)  Complete Medication List: 1)  Hydrocodone-acetaminophen 7.5-750 Mg Tabs (Hydrocodone-acetaminophen) .... Take one tablet every 6-8 hours 2)  Alprazolam 0.5 Mg Tbdp (Alprazolam) .Marland Kitchen.. 1 in the morning and 2 at bedtime 3)  Symbicort 80-4.5 Mcg/act Aero (Budesonide-formoterol fumarate) .... 2 pufs twice daily 4)  Proventil Hfa 108 (90 Base) Mcg/act Aers (Albuterol sulfate) .... 2 puffs three to four times daily as needed for wheezing 5)  Cardizem La 360 Mg Xr24h-tab (Diltiazem hcl coated beads) .... Take 1 tablet by mouth once a day 6)  Albuterol Sulfate (2.5 Mg/26ml) 0.083% Nebu (Albuterol sulfate) .... One vial per nebulizer four times a day  as needed 7)  Cyclobenzaprine Hcl 5 Mg Tabs (Cyclobenzaprine  hcl) .... Take 1 at bedtime as needed for muscle spasms 8)  Clotrimazole 1 % Crea (Clotrimazole) .... Apply two times a day as needed 9)  Citalopram Hydrobromide 10 Mg Tabs (Citalopram hydrobromide) .... Take 1 daily for depression 10)  Pantoprazole Sodium 40 Mg Tbec (Pantoprazole sodium) .... Take 1 daily for heartburn 11)  Benzonatate 100 Mg Caps (Benzonatate) .... Take 1 every 8 hrs as needed for cough 12)  Exelon 4.6 Mg/24hr Pt24 (Rivastigmine) .... Apply one patch daily 13)  Furosemide 20 Mg Tabs (Furosemide) .... Take 1 tablet by mouth once a day  as needed for  leg swelling 14)  Klor-con M20 20 Meq Cr-tabs (Potassium chloride crys cr) .... Take 1 tablet by mouth once a day as needed ypou need to take the potassium pill every day you tske the furosemide 15)  Doxycycline Hyclate 100 Mg Caps (Doxycycline hyclate) .... Take 1 capsule by mouth two times a day  Patient Instructions: 1)  Please schedule a follow-up appointment in 2 months. 2)  med is sent to the pharmacy, and you will get an injection in the office today for  leg swelling 3)  Merry Christmas Prescriptions: DOXYCYCLINE HYCLATE 100 MG CAPS (DOXYCYCLINE HYCLATE) Take 1 capsule by mouth two times a day  #14 x 0   Entered and Authorized by:   Syliva Overman MD   Signed by:   Syliva Overman MD on 05/12/2010   Method used:   [facility]        [facility]  [place] [address]       [address]       [place], [place]  [address]       Ph: [phone] or [phone]       Fax: [phone]   RxID:   [id number]    Medication Administration  Injection # 1:    Medication: Ketorolac-Toradol 15mg     Diagnosis: BACK PAIN (ICD-724.5)    Route: IM    Site: LUOQ gluteus    Exp Date: 05/25/2011    Lot #: [id number]    Mfr: novaplus    Comments: 60mg  given (30mg  vials x2)    Patient tolerated injection without complications    Given by: Adella Hare LPN (May 12, 2010 12:14 PM)  Injection # 2:    Medication:  Furosemide- Lasix Injection    Diagnosis: LEG EDEMA, BILATERAL (ICD-782.3)    Route: IM    Site: RUOQ gluteus    Exp Date: 07/23/2010    Lot #: [id number]    Mfr: hospira    Comments: lasix 10mg  given    Patient tolerated injection without complications    Given by: Adella Hare LPN (May 12, 2010 12:15 PM)  Orders Added: 1)  Est. Patient Level IV [99214] 2)  Ketorolac-Toradol 15mg  [J1885] 3)  Furosemide- Lasix Injection [J1940] 4)  Admin of Therapeutic Inj  intramuscular or subcutaneous [96372] 5)  Medicare Electronic Prescription [G8553]     Medication Administration  Injection # 1:    Medication: Ketorolac-Toradol 15mg     Diagnosis: BACK PAIN (ICD-724.5)    Route: IM    Site: LUOQ gluteus    Exp Date: 05/25/2011    Lot #: [id number]    Mfr: novaplus    Comments: 60mg  given (30mg  vials x2)    Patient tolerated injection without complications    Given by: Adella Hare LPN (May 12, 2010 12:14 PM)  Injection # 2:    Medication: Furosemide- Lasix Injection    Diagnosis: LEG EDEMA, BILATERAL (ICD-782.3)    Route: IM    Site: RUOQ gluteus    Exp Date: 07/23/2010    Lot #: [id number]    Mfr: hospira    Comments: lasix 10mg  given    Patient tolerated injection without complications    Given by: Adella Hare LPN (May 12, 2010 12:15 PM)  Orders Added: 1)  Est. Patient Level IV [95188] 2)  Ketorolac-Toradol 15mg  [J1885] 3)  Furosemide- Lasix Injection [J1940] 4)  Admin of Therapeutic Inj  intramuscular or subcutaneous [96372] 5)  Medicare Electronic Prescription [C1660]

## 2010-06-25 NOTE — Letter (Signed)
Summary: Letter TO PHARMACY  Letter TO PHARMACY   Imported By: Lind Guest 06/08/2010 15:05:53  _____________________________________________________________________  External Attachment:    Type:   Image     Comment:   External Document

## 2010-06-25 NOTE — Letter (Signed)
Summary: [facility]/ [facility]  [facility]/ [facility]   Imported By: Lind Guest 06/04/2010 08:18:50  _____________________________________________________________________  External Attachment:    Type:   Image     Comment:   External Document

## 2010-06-25 NOTE — Assessment & Plan Note (Signed)
Summary: cellutis and fluid in legs   Vital Signs:  Patient profile:   76 year old female Menstrual status:  hysterectomy Height:      63 inches Weight:      241.50 pounds BMI:     42.93 O2 Sat:      90 % Pulse rate:   81 / minute Pulse rhythm:   regular Resp:     18 per minute BP sitting:   120 / 70  (left arm)  Vitals Entered By: Everitt Amber LPN (June 02, 2010 3:46 PM)  Nutrition Counseling: Patient's BMI is greater than 25 and therefore counseled on weight management options. CC: patient fell last week and got bruised up some and also she has been crying for 4 days and very depressed. Legs swelling   Primary Care Provider:  Kerri Perches, MD  CC:  patient fell last week and got bruised up some and also she has been crying for 4 days and very depressed. Legs swelling.  History of Present Illness: [name] this last weekend , left knee gave out, she has seen dr Farris Has, states she was told it was really bruised and she would be rferred to Dr Carola Frost. States she does good except her knees. C/O increased depression for 2 to 3 months, feels as is she may be getting back into a bad state of depression which she dioes not want  Current Medications (verified): 1)  Hydrocodone-Acetaminophen 7.5-750 Mg  Tabs (Hydrocodone-Acetaminophen) .... Take One Tablet Every 6-8 Hours 2)  Alprazolam 0.5 Mg Tbdp (Alprazolam) .Marland Kitchen.. 1 in The Morning and 2 At Bedtime 3)  Symbicort 80-4.5 Mcg/act Aero (Budesonide-Formoterol Fumarate) .... 2 Pufs Twice Daily 4)  Cardizem La 360 Mg Xr24h-Tab (Diltiazem Hcl Coated Beads) .... Take 1 Tablet By Mouth Once A Day 5)  Albuterol Sulfate (2.5 Mg/56ml) 0.083% Nebu (Albuterol Sulfate) .... One Vial Per Nebulizer Four Times A Day  As Needed 6)  Clotrimazole 1 % Crea (Clotrimazole) .... Apply Two Times A Day As Needed 7)  Citalopram Hydrobromide 10 Mg Tabs (Citalopram Hydrobromide) .... Take 1 Daily For Depression 8)  Pantoprazole Sodium 40 Mg Tbec (Pantoprazole  Sodium) .... Take 1 Daily For Heartburn 9)  Exelon 4.6 Mg/24hr Pt24 (Rivastigmine) .... Apply One Patch Daily 10)  Furosemide 20 Mg Tabs (Furosemide) .... Take 1 Tablet By Mouth Once A Day  As Needed For  Leg Swelling 11)  Klor-Con M20 20 Meq Cr-Tabs (Potassium Chloride Crys Cr) .... Take 1 Tablet By Mouth Once A Day As Needed Ypou Need To Take The Potassium Pill Every Day You Tske The Furosemide  Allergies (verified): 1)  ! Pcn 2)  ! Codeine 3)  ! * Duragesic 4)  ! Levaquin 5)  ! Morphine 6)  ! Prednisone 7)  ! * Ciprofloxacin 8)  ! Prednisone  Review of Systems      See HPI General:  Complains of fatigue, malaise, and sleep disorder. Eyes:  Denies discharge and red eye. ENT:  Denies hoarseness, nasal congestion, sinus pressure, and sore throat. CV:  Denies chest pain or discomfort, palpitations, and swelling of feet. Resp:  Denies cough and sputum productive. GI:  Denies abdominal pain, constipation, diarrhea, nausea, and vomiting. GU:  Denies dysuria and urinary frequency. MS:  Complains of joint pain, low back pain, mid back pain, and stiffness. Psych:  Complains of depression, easily tearful, and mental problems; denies suicidal thoughts/plans, thoughts of violence, and unusual visions or sounds; depressed, sad, crying all the time for  months has not had citalopram. Endo:  Denies cold intolerance, excessive hunger, excessive thirst, and excessive urination. Heme:  Denies abnormal bruising and bleeding. Allergy:  Complains of seasonal allergies; denies hives or rash and itching eyes.  Physical Exam  General:  Well-developed,obese,in no acute distress; alert,appropriate and cooperative throughout examination.Tearful at times HEENT: No facial asymmetry,  EOMI, No sinus tenderness, TM's Clear, oropharynx  pink and moist.   Chest: Clear to auscultation bilaterally.Decreased air entry throughout CVS: S1, S2, No murmurs, No S3.   Abd: Soft, Nontender.  MS: decreased ROM spine,  hips, shoulders and knees.  EXB:MWUXL edema.   CNS: CN 2-12 intact, power tone and sensation normal throughout.   Skin: Intact,erythema of both legs and mild warmth Psych: Good eye contact, normal affect.  Memory loss,  depressed appearing.    Impression & Recommendations:  Problem # 1:  KNEE PAIN, LEFT (ICD-719.46) Assessment Unchanged  The following medications were removed from the medication list:    Cyclobenzaprine Hcl 5 Mg Tabs (Cyclobenzaprine hcl) .Marland Kitchen... Take 1 at bedtime as needed for muscle spasms Her updated medication list for this problem includes:    Hydrocodone-acetaminophen 7.5-750 Mg Tabs (Hydrocodone-acetaminophen) .Marland Kitchen... Take one tablet every 6-8 hours  Orders: Orthopedic Referral (Ortho)  Problem # 2:  CELLULITIS (ICD-682.9) Assessment: Improved  The following medications were removed from the medication list:    Doxycycline Hyclate 100 Mg Caps (Doxycycline hyclate) .Marland Kitchen... Take 1 capsule by mouth two times a day  Problem # 3:  LEG EDEMA, BILATERAL (ICD-782.3) Assessment: Improved  Her updated medication list for this problem includes:    Furosemide 20 Mg Tabs (Furosemide) .Marland Kitchen... Take 1 tablet by mouth once a day  as needed for  leg swelling  Problem # 4:  DEPRESSION, MAJOR, RECURRENT, SEVERE, W/PSYCHOTIC BEHAVIOR (ICD-296.34) Assessment: Deteriorated  Orders: Medicare Electronic Prescription [phone])  Problem # 5:  GERD (ICD-530.81) Assessment: Unchanged  Her updated medication list for this problem includes:    Pantoprazole Sodium 40 Mg Tbec (Pantoprazole sodium) .Marland Kitchen... Take 1 daily for heartburn  Problem # 6:  HYPERTENSION (ICD-401.9) Assessment: Improved  Her updated medication list for this problem includes:    Cardizem La 360 Mg Xr24h-tab (Diltiazem hcl coated beads) .Marland Kitchen... Take 1 tablet by mouth once a day    Furosemide 20 Mg Tabs (Furosemide) .Marland Kitchen... Take 1 tablet by mouth once a day  as needed for  leg swelling  BP today: 120/70 Prior BP: 130/90  (05/12/2010)  Labs Reviewed: K+: 4.3 (11/20/2009) Creat: : 0.69 (11/20/2009)   Chol: 216 (11/20/2009)   HDL: 48 (11/20/2009)   LDL: 137 (11/20/2009)   TG: 153 (11/20/2009)  Complete Medication List: 1)  Hydrocodone-acetaminophen 7.5-750 Mg Tabs (Hydrocodone-acetaminophen) .... Take one tablet every 6-8 hours 2)  Alprazolam 0.5 Mg Tbdp (Alprazolam) .Marland Kitchen.. 1 in the morning and 2 at bedtime 3)  Symbicort 80-4.5 Mcg/act Aero (Budesonide-formoterol fumarate) .... 2 pufs twice daily 4)  Cardizem La 360 Mg Xr24h-tab (Diltiazem hcl coated beads) .... Take 1 tablet by mouth once a day 5)  Albuterol Sulfate (2.5 Mg/58ml) 0.083% Nebu (Albuterol sulfate) .... One vial per nebulizer four times a day  as needed 6)  Clotrimazole 1 % Crea (Clotrimazole) .... Apply two times a day as needed 7)  Pantoprazole Sodium 40 Mg Tbec (Pantoprazole sodium) .... Take 1 daily for heartburn 8)  Exelon 4.6 Mg/24hr Pt24 (Rivastigmine) .... Apply one patch daily 9)  Furosemide 20 Mg Tabs (Furosemide) .... Take 1 tablet by mouth once a day  as needed for  leg swelling 10)  Klor-con M20 20 Meq Cr-tabs (Potassium chloride crys cr) .... Take 1 tablet by mouth once a day as needed ypou need to take the potassium pill every day you tske the furosemide 11)  Citalopram Hydrobromide 10 Mg Tabs (Citalopram hydrobromide) .... Take 1 tablet by mouth once a day 12)  Tessalon Perles 100 Mg Caps (Benzonatate) .... Take 1 capsule by mouth three times a day as needed for cough and chest congestion  Patient Instructions: 1)  Please schedule a follow-up appointment in 2 months. 2)  PLS`start new med today for depression, collect today. 3)  I have referred you to dr Carola Frost about the left knee. Prescriptions: TESSALON PERLES 100 MG CAPS (BENZONATATE) Take 1 capsule by mouth three times a day as needed for cough and chest congestion  #30 x 1   Entered and Authorized by:   Syliva Overman MD   Signed by:   Syliva Overman MD on 06/08/2010    Method used:   [facility]        [facility]  [place] [address] [facility]       [address] [place] [address]       [place], [place]  [address]       Ph: [phone] or [phone]       Fax: [phone]   RxID:   [id number] PREDNISONE (PAK) 5 MG TABS (PREDNISONE) Use as directed  #21 x 0   Entered and Authorized by:   Syliva Overman MD   Signed by:   Syliva Overman MD on 06/08/2010   Method used:   [facility]        [facility]  [place] [address]       [address]       [place], [place]  [address]       Ph: [phone] or [phone]       Fax: [phone]   RxID:   [id number] CITALOPRAM HYDROBROMIDE 10 MG TABS (CITALOPRAM HYDROBROMIDE) Take 1 tablet by mouth once a day  #30 x 3   Entered and Authorized by:   Syliva Overman MD   Signed by:   Syliva Overman MD on 06/02/2010   Method used:   [facility]        [facility]  [place] [address]       [address]       [place], [place]  [address]       Ph: [phone] or [phone]       Fax: [phone]   RxID:   [id number]    Orders Added: 1)  Est. Patient Level IV [51025] 2)  Medicare Electronic Prescription [E5277] 3)  Orthopedic Referral [Ortho]

## 2010-06-25 NOTE — Progress Notes (Signed)
Summary: medicine  Phone Note Call from Patient   Summary of Call: pt [facility] rash[phone] Initial call taken by: Rudene Anda,  June 18, 2010 10:26 AM  Follow-up for Phone Call        still has the rash on her bottom and the cream she has is doing nothing for it. She said the sulfur med she got last time cleared it up for awhile. Wants that again or something else that might would clear it up. [facility] Follow-up by: Everitt Amber LPN,  June 18, 2010 10:53 AM  Additional Follow-up for Phone Call Additional follow up Details #1::        med entered pls send in after you spk with her Additional Follow-up by: Syliva Overman MD,  June 19, 2010 1:49 PM    Additional Follow-up for Phone Call Additional follow up Details #2::    Patient aware Follow-up by: Everitt Amber LPN,  June 19, 2010 1:52 PM  New/Updated Medications: TRIPLE ANTIBIOTIC [phone] OINT (NEOMYCIN-BACITRACIN-POLYMYXIN) apply twice daily for 1 week , then as needed, to rash Prescriptions: TRIPLE ANTIBIOTIC [phone] OINT (NEOMYCIN-BACITRACIN-POLYMYXIN) apply twice daily for 1 week , then as needed, to rash  #45 gm x 1   Entered by:   Everitt Amber LPN   Authorized by:   Syliva Overman MD   Signed by:   Everitt Amber LPN on 30/86/5784   Method used:   [facility]  [place] [address]       [address]       [place], [place]  [address]       Ph: [phone] or [phone]       Fax: [phone]   RxID:   [id number]

## 2010-06-25 NOTE — Letter (Signed)
Summary: medical release  medical release   Imported By: Lind Guest 06/03/2010 13:24:42  _____________________________________________________________________  External Attachment:    Type:   Image     Comment:   External Document

## 2010-06-26 ENCOUNTER — Telehealth: Payer: Self-pay | Admitting: Family Medicine

## 2010-07-01 NOTE — Progress Notes (Signed)
Summary: form  Phone Note Call from Patient   Summary of Call: [facility] called about cardizem and getting it faxed [phone] Initial call taken by: Rudene Anda,  June 26, 2010 10:31 AM  Follow-up for Phone Call        we will not be completing form, alternative has been prescribed Follow-up by: Adella Hare LPN,  June 26, 2010 10:35 AM

## 2010-07-01 NOTE — Progress Notes (Signed)
Summary: please call  Phone Note Call from Patient   Summary of Call: pt called and stated needs medicine sent to insurance company. And also insurance company called and stated they had sent information about this. And to please call them back. [phone] Initial call taken by: Rudene Anda,  June 24, 2010 10:15 AM  Follow-up for Phone Call        cardizem is non formulary and they will fax form for completion to our office Follow-up by: Adella Hare LPN,  June 24, 2010 11:13 AM

## 2010-07-01 NOTE — Progress Notes (Signed)
  Phone Note Other Incoming   Caller: medicare hmo/dr simpson Summary of Call: advise pt peer her ins she needs to fill the generic form opf cardizem, it is the same drug, I will enter historically, pls send after you spk with her and notify phatrmacy also Initial call taken by: Syliva Overman MD,  June 25, 2010 6:01 PM  Follow-up for Phone Call        patient aware and med sent Follow-up by: Adella Hare LPN,  June 26, 2010 8:42 AM    New/Updated Medications: DILTIAZEM HCL COATED BEADS 360 MG XR24H-CAP (DILTIAZEM HCL COATED BEADS) Take 1 capsule by mouth once a day d/c cardizem Prescriptions: DILTIAZEM HCL COATED BEADS 360 MG XR24H-CAP (DILTIAZEM HCL COATED BEADS) Take 1 capsule by mouth once a day d/c cardizem  #30 x 4   Entered by:   Adella Hare LPN   Authorized by:   Syliva Overman MD   Signed by:   Adella Hare LPN on 16/02/9603   Method used:   [facility]        [facility]  [place] [address] [facility]       [address] [place] [address]       [place], [place]  [address]       Ph: [phone] or [phone]       Fax: [phone]   RxID:   [id number]

## 2010-07-09 ENCOUNTER — Ambulatory Visit (INDEPENDENT_AMBULATORY_CARE_PROVIDER_SITE_OTHER): Payer: Medicare Other | Admitting: Family Medicine

## 2010-07-09 ENCOUNTER — Encounter: Payer: Self-pay | Admitting: Family Medicine

## 2010-07-09 DIAGNOSIS — E785 Hyperlipidemia, unspecified: Secondary | ICD-10-CM

## 2010-07-09 DIAGNOSIS — J45909 Unspecified asthma, uncomplicated: Secondary | ICD-10-CM

## 2010-07-09 DIAGNOSIS — M25569 Pain in unspecified knee: Secondary | ICD-10-CM

## 2010-07-09 DIAGNOSIS — I10 Essential (primary) hypertension: Secondary | ICD-10-CM

## 2010-07-09 DIAGNOSIS — L0291 Cutaneous abscess, unspecified: Secondary | ICD-10-CM

## 2010-07-09 DIAGNOSIS — M549 Dorsalgia, unspecified: Secondary | ICD-10-CM

## 2010-07-10 ENCOUNTER — Other Ambulatory Visit: Payer: Self-pay | Admitting: Family Medicine

## 2010-07-10 DIAGNOSIS — Z139 Encounter for screening, unspecified: Secondary | ICD-10-CM

## 2010-07-12 DIAGNOSIS — F329 Major depressive disorder, single episode, unspecified: Secondary | ICD-10-CM | POA: Insufficient documentation

## 2010-07-12 DIAGNOSIS — F419 Anxiety disorder, unspecified: Secondary | ICD-10-CM

## 2010-07-12 DIAGNOSIS — G309 Alzheimer's disease, unspecified: Secondary | ICD-10-CM

## 2010-07-12 DIAGNOSIS — F0280 Dementia in other diseases classified elsewhere without behavioral disturbance: Secondary | ICD-10-CM | POA: Insufficient documentation

## 2010-07-14 ENCOUNTER — Ambulatory Visit: Payer: Self-pay | Admitting: Family Medicine

## 2010-07-14 ENCOUNTER — Telehealth: Payer: Self-pay | Admitting: Family Medicine

## 2010-07-15 ENCOUNTER — Telehealth: Payer: Self-pay | Admitting: Family Medicine

## 2010-07-16 ENCOUNTER — Telehealth: Payer: Self-pay | Admitting: Family Medicine

## 2010-07-17 ENCOUNTER — Encounter: Payer: Self-pay | Admitting: Family Medicine

## 2010-07-20 ENCOUNTER — Ambulatory Visit (HOSPITAL_COMMUNITY): Payer: Medicare Other

## 2010-07-21 NOTE — Progress Notes (Signed)
Summary: medicine  Phone Note Call from Patient   Summary of Call: [facility] stating pt couldn't take it. did office recieve? [phone] Initial call taken by: Rudene Anda,  July 16, 2010 9:49 AM  Follow-up for Phone Call        called patient no answer Follow-up by: Everitt Amber LPN,  July 16, 2010 4:24 PM  Additional Follow-up for Phone Call Additional follow up Details #1::        please notify the patient and the pharmacy of the medication change. The new script is entered historically, please send after speaking with the patient.  Additional Follow-up by: Syliva Overman MD,  July 17, 2010 4:44 PM    Additional Follow-up for Phone Call Additional follow up Details #2::    patient aware and med sent Follow-up by: Adella Hare LPN,  July 17, 2010 4:47 PM  New/Updated Medications: AMLODIPINE BESYLATE 5 MG TABS (AMLODIPINE BESYLATE) Take 1 tablet by mouth once a day discontinue diltiazem effective 07/17/2010 Prescriptions: AMLODIPINE BESYLATE 5 MG TABS (AMLODIPINE BESYLATE) Take 1 tablet by mouth once a day discontinue diltiazem effective 07/17/2010  #30 x 1   Entered by:   Adella Hare LPN   Authorized by:   Syliva Overman MD   Signed by:   Adella Hare LPN on 45/40/9811   Method used:   [facility]        [facility]  [place] [address] [facility]       [address] [place] [address]       [place], [place]  [address]       Ph: [phone] or [phone]       Fax: [phone]   RxID:   [id number] AMLODIPINE BESYLATE 5 MG TABS (AMLODIPINE BESYLATE) Take 1 tablet by mouth once a day discontinue diltiazem effective 07/17/2010  #30 x 1   Entered and Authorized by:   Syliva Overman MD   Signed by:   Syliva Overman MD on 07/17/2010   Method used:   Historical   RxID:   [id number]

## 2010-07-21 NOTE — Progress Notes (Signed)
Summary: sick on med  Phone Note Call from Patient   Summary of Call: pt couldn't take medicine in past made her sick. she does not have a virus are eat anything to make her sick. so it has to be medicine. husband is really upset. please give fim a call. [phone] Initial call taken by: Rudene Anda,  July 15, 2010 9:10 AM  Follow-up for Phone Call        husband states patient cannot tolerate the med, wants either cardizem or a different med  husband wants [place] to try to pa the cardizem Follow-up by: Adella Hare LPN,  July 15, 2010 9:13 AM  Additional Follow-up for Phone Call Additional follow up Details #1::        please notify the patient and the pharmacy of the medication change. The new script is entered historically, please send after speaking with the patient.  Additional Follow-up by: Syliva Overman MD,  July 15, 2010 12:23 PM   New Allergies: ! * DILTIAZEM Additional Follow-up for Phone Call Additional follow up Details #2::    Max aware Follow-up by: Everitt Amber LPN,  July 15, 2010 1:33 PM  New/Updated Medications: CARDIZEM LA 360 MG XR24H-TAB (DILTIAZEM HCL COATED BEADS) Take 1 tablet by mouth once a day discontinue diltiazem effective  07/15/2010 due to pt intolerance New Allergies: ! * DILTIAZEMPrescriptions: CARDIZEM LA 360 MG XR24H-TAB (DILTIAZEM HCL COATED BEADS) Take 1 tablet by mouth once a day discontinue diltiazem effective  07/15/2010 due to pt intolerance  #30 x 5   Entered and Authorized by:   Syliva Overman MD   Signed by:   Syliva Overman MD on 07/15/2010   Method used:   Printed then faxed to ...       [facility]  [place] [address] ([facility]ail)       [address]       [place], [place]  [address]       Ph: [phone] or [phone]       Fax: [phone]   RxID:   [id number]

## 2010-07-21 NOTE — Assessment & Plan Note (Signed)
Summary: office visit   Vital Signs:  Patient profile:   76 year old female Menstrual status:  hysterectomy Height:      63 inches Weight:      237 pounds BMI:     42.13 O2 Sat:      91 % Pulse rate:   86 / minute Pulse rhythm:   regular Resp:     16 per minute BP sitting:   138 / 84  (left arm) Cuff size:   large  Vitals Entered By: Everitt Amber LPN (July 09, 2010 4:08 PM)  Nutrition Counseling: Patient's BMI is greater than 25 and therefore counseled on weight management options. CC: c/o severe pain in both legs, swelling, ongoing problem and wants to know why nothing has been done about it    Primary Care Provider:  Kerri Perches, MD  CC:  c/o severe pain in both legs, swelling, and ongoing problem and wants to know why nothing has been done about it .  History of Present Illness: pain and swelling of left leg cpouln't wwalk yesterday, states the sleep is also disturbed. she saw orhto yesterday and got a new  brace for the left knee.  she reports improvement in her mental state with the resumption of citalopram. She denies fever or chills, but states her legs seem alittle red and swollen, she is cocerned about  the possible of cellulitis of the legs  Allergies (verified): 1)  ! Pcn 2)  ! Codeine 3)  ! * Duragesic 4)  ! Levaquin 5)  ! Morphine 6)  ! Prednisone 7)  ! * Ciprofloxacin 8)  ! Prednisone  Review of Systems      See HPI General:  Complains of fatigue and weakness. Eyes:  Denies discharge, eye pain, and red eye. ENT:  Denies hoarseness, nasal congestion, and sinus pressure. CV:  Denies chest pain or discomfort and near fainting. Resp:  Complains of shortness of breath and wheezing; denies cough and sputum productive. GI:  Denies abdominal pain, constipation, diarrhea, nausea, and vomiting. GU:  Denies dysuria and urinary frequency. MS:  Complains of joint pain, low back pain, mid back pain, muscle weakness, and stiffness. Psych:  Complains of  anxiety, depression, and mental problems; denies alternate hallucination ( auditory/visual), suicidal thoughts/plans, thoughts of violence, unusual visions or sounds, and thoughts /plans of harming others. Endo:  Denies cold intolerance, excessive hunger, excessive thirst, excessive urination, and heat intolerance. Heme:  Complains of abnormal bruising; denies bleeding. Allergy:  Complains of seasonal allergies.  Physical Exam  General:  Well-developed,obese,in no acute distress; alert,appropriate and cooperative throughout examination HEENT: No facial asymmetry,  EOMI, No sinus tenderness, TM's Clear, oropharynx  pink and moist.   Chest: Clear to auscultation bilaterally.Decreased air entry throughout CVS: S1, S2, No murmurs, No S3.   Abd: Soft, Nontender.  MS: decreased ROM spine, hips, shoulders and knees. Pt walks with assistive device ZOX:WRUEA edema.   CNS: CN 2-12 intact, power tone and sensation normal throughout.   Skin: Intact,erythema of both legs Psych: Good eye contact, normal affect.  Memory loss,not depressed appearing.    Impression & Recommendations:  Problem # 1:  CELLULITIS (ICD-682.9) Assessment Comment Only  Her updated medication list for this problem includes:    Septra Ds 800-160 Mg Tabs (Sulfamethoxazole-trimethoprim) .Marland Kitchen... Take 1 tablet by mouth two times a day  Orders: Medicare Electronic Prescription [phone])  Problem # 2:  KNEE PAIN, LEFT (ICD-719.46) Assessment: Improved  Her updated medication list for this problem  includes:    Hydrocodone-acetaminophen 7.5-750 Mg Tabs (Hydrocodone-acetaminophen) .Marland Kitchen... Take one tablet every 6-8 hours recently saw ortho and ios wearing a knee brace which she states heklps.  Problem # 3:  LEG EDEMA, BILATERAL (ICD-782.3) Assessment: Improved  Her updated medication list for this problem includes:    Furosemide 20 Mg Tabs (Furosemide) .Marland Kitchen... Take 1 tablet by mouth once a day  as needed for  leg swelling  Problem  # 4:  ASTHMA (ICD-493.90) Assessment: Unchanged  Her updated medication list for this problem includes:    Symbicort 80-4.5 Mcg/act Aero (Budesonide-formoterol fumarate) .Marland Kitchen... 2 pufs twice daily    Albuterol Sulfate (2.5 Mg/15ml) 0.083% Nebu (Albuterol sulfate) ..... One vial per nebulizer four times a day  as needed  Problem # 5:  HYPERTENSION (ICD-401.9) Assessment: Deteriorated  Her updated medication list for this problem includes:    Furosemide 20 Mg Tabs (Furosemide) .Marland Kitchen... Take 1 tablet by mouth once a day  as needed for  leg swelling    Diltiazem Hcl Coated Beads 360 Mg Xr24h-cap (Diltiazem hcl coated beads) .Marland Kitchen... Take 1 capsule by mouth once a day d/c cardizem  BP today: 138/84 Prior BP: 120/70 (06/02/2010)  Labs Reviewed: K+: 4.3 (11/20/2009) Creat: : 0.69 (11/20/2009)   Chol: 216 (11/20/2009)   HDL: 48 (11/20/2009)   LDL: 137 (11/20/2009)   TG: 153 (11/20/2009)  Problem # 6:  ALZHEIMERS DISEASE (ICD-331.0) Assessment: Comment Only continue exelon patch at suboptimal dose  Problem # 7:  DEPRESSION, RECURRENT (ICD-311) Assessment: Improved  Her updated medication list for this problem includes:    Alprazolam 0.5 Mg Tbdp (Alprazolam) .Marland Kitchen... 1 in the morning and 2 at bedtime    Citalopram Hydrobromide 10 Mg Tabs (Citalopram hydrobromide) .Marland Kitchen... Take 1 tablet by mouth once a day  Complete Medication List: 1)  Hydrocodone-acetaminophen 7.5-750 Mg Tabs (Hydrocodone-acetaminophen) .... Take one tablet every 6-8 hours 2)  Alprazolam 0.5 Mg Tbdp (Alprazolam) .Marland Kitchen.. 1 in the morning and 2 at bedtime 3)  Symbicort 80-4.5 Mcg/act Aero (Budesonide-formoterol fumarate) .... 2 pufs twice daily 4)  Albuterol Sulfate (2.5 Mg/84ml) 0.083% Nebu (Albuterol sulfate) .... One vial per nebulizer four times a day  as needed 5)  Clotrimazole 1 % Crea (Clotrimazole) .... Apply two times a day as needed 6)  Pantoprazole Sodium 40 Mg Tbec (Pantoprazole sodium) .... Take 1 daily for heartburn 7)  Exelon  4.6 Mg/24hr Pt24 (Rivastigmine) .... Apply one patch daily 8)  Furosemide 20 Mg Tabs (Furosemide) .... Take 1 tablet by mouth once a day  as needed for  leg swelling 9)  Klor-con M20 20 Meq Cr-tabs (Potassium chloride crys cr) .... Take 1 tablet by mouth once a day as needed ypou need to take the potassium pill every day you tske the furosemide 10)  Citalopram Hydrobromide 10 Mg Tabs (Citalopram hydrobromide) .... Take 1 tablet by mouth once a day 11)  Triple Antibiotic [phone] Oint (Neomycin-bacitracin-polymyxin) .... Apply twice daily for 1 week , then as needed, to rash 12)  Diltiazem Hcl Coated Beads 360 Mg Xr24h-cap (Diltiazem hcl coated beads) .... Take 1 capsule by mouth once a day d/c cardizem 13)  Septra Ds 800-160 Mg Tabs (Sulfamethoxazole-trimethoprim) .... Take 1 tablet by mouth two times a day  Other Orders: T-Basic Metabolic Panel [phone]) T-Lipid Profile [phone]) T-CBC w/Diff [id number]) T-TSH [phone]) Future Orders: Radiology Referral (Radiology) ... 07/10/2010  Patient Instructions: 1)  Please schedule a follow-up appointment in 3 months. 2)  You need to lose weight. Consider a  lower calorie diet and regular exercise.  3)  BMP prior to visit, ICD-9: 4)  Lipid Panel prior to visit, ICD-9: 5)  TSH prior to visit, ICD-9:   fasting in 3 months. 6)  CBC w/ Diff prior to visit, ICD-9: 7)  I have sent in septra, pls do not use unless your legs get worse 8)  We will sched a mamogram in March Prescriptions: SEPTRA DS 800-160 MG TABS (SULFAMETHOXAZOLE-TRIMETHOPRIM) Take 1 tablet by mouth two times a day  #20 x 0   Entered and Authorized by:   Syliva Overman MD   Signed by:   Syliva Overman MD on 07/09/2010   Method used:   [facility]        [facility]  [place] [address] [facility]       [address]       [place], [place]  [address]       Ph: [phone] or [phone]       Fax: [phone]   RxID:   [id number]    Orders Added: 1)  Est.  Patient Level IV [25852] 2)  Medicare Electronic Prescription [G8553] 3)  T-Basic Metabolic Panel [[id number]] 4)  T-Lipid Profile [[id number]] 5)  T-CBC w/Diff [[id number]] 6)  T-TSH [[id number]] 7)  Radiology Referral [Radiology]

## 2010-07-21 NOTE — Progress Notes (Signed)
Summary: bp med making her sick  Phone Note Call from Patient   Summary of Call: pt taking blood pressure medicine and it makes her sick. she is trembling please call back [phone] Initial call taken by: Rudene Anda,  July 14, 2010 10:16 AM  Follow-up for Phone Call        patient states diltiazem is making her sick to her stomach, cant sleep, nervous Follow-up by: Adella Hare LPN,  July 14, 2010 10:24 AM  Additional Follow-up for Phone Call Additional follow up Details #1::        let her know it is the same drug as the cardizem just another form, this is what her ins will cover, she should try it a little more, but if she thinks it is hopeless, then she will have to pay a higher price for the cardizem Additional Follow-up by: Syliva Overman MD,  July 14, 2010 12:35 PM    Additional Follow-up for Phone Call Additional follow up Details #2::    patient states she will try it and let us know Follow-up by: Adella Hare LPN,  July 15, 2010 9:01 AM

## 2010-07-21 NOTE — Letter (Signed)
Summary: d/c medicine  d/c medicine   Imported By: Lind Guest 07/17/2010 14:43:53  _____________________________________________________________________  External Attachment:    Type:   Image     Comment:   External Document

## 2010-07-22 ENCOUNTER — Telehealth: Payer: Self-pay | Admitting: Family Medicine

## 2010-07-23 ENCOUNTER — Telehealth: Payer: Self-pay | Admitting: Family Medicine

## 2010-07-23 ENCOUNTER — Encounter: Payer: Self-pay | Admitting: Family Medicine

## 2010-07-24 ENCOUNTER — Encounter: Payer: Self-pay | Admitting: Family Medicine

## 2010-07-25 ENCOUNTER — Encounter: Payer: Self-pay | Admitting: Family Medicine

## 2010-07-29 ENCOUNTER — Encounter: Payer: Self-pay | Admitting: Family Medicine

## 2010-07-29 ENCOUNTER — Ambulatory Visit (INDEPENDENT_AMBULATORY_CARE_PROVIDER_SITE_OTHER): Payer: Medicare Other | Admitting: Family Medicine

## 2010-07-29 DIAGNOSIS — J209 Acute bronchitis, unspecified: Secondary | ICD-10-CM

## 2010-07-29 DIAGNOSIS — E785 Hyperlipidemia, unspecified: Secondary | ICD-10-CM

## 2010-07-29 DIAGNOSIS — I10 Essential (primary) hypertension: Secondary | ICD-10-CM

## 2010-07-29 DIAGNOSIS — F411 Generalized anxiety disorder: Secondary | ICD-10-CM

## 2010-07-29 DIAGNOSIS — M1990 Unspecified osteoarthritis, unspecified site: Secondary | ICD-10-CM

## 2010-07-30 NOTE — Progress Notes (Signed)
Summary: sick  Phone Note Call from Patient   Summary of Call: chills and cold and ashma. needs to be seen no appts [phone] Initial call taken by: Rudene Anda,  July 22, 2010 1:21 PM  Follow-up for Phone Call        chills, hoarse, nausea, no fever, some body aches Follow-up by: Adella Hare LPN,  July 22, 2010 1:59 PM  Additional Follow-up for Phone Call Additional follow up Details #1::        advise urgent care or ED Additional Follow-up by: Syliva Overman MD,  July 22, 2010 5:13 PM    Additional Follow-up for Phone Call Additional follow up Details #2::    patient aware Follow-up by: Adella Hare LPN,  July 22, 2010 5:15 PM

## 2010-07-30 NOTE — Progress Notes (Signed)
  Phone Note Call from Patient   Caller: Spouse Summary of Call: patient's husband called in states patients asthma has flared up and she has to be seen, i spoke with patient yesterday and advised er or urgent care, advised husband of this and he states is we do not work her in today that he will move their records, i advised Mr Sieloff that we have no openings and one provider and we do not wish for him to move their records but that is his personal choice and we cannot stop him, i apologized again that we could not see her today and encouraged him to get her to urgent care for treatment and he said he had somewhere to take her and he may or may not be back in touch with [place] Initial call taken by: Adella Hare LPN,  July 23, 2010 11:51 AM

## 2010-07-31 ENCOUNTER — Encounter: Payer: Self-pay | Admitting: Family Medicine

## 2010-08-03 DIAGNOSIS — R7309 Other abnormal glucose: Secondary | ICD-10-CM | POA: Insufficient documentation

## 2010-08-04 ENCOUNTER — Encounter: Payer: Self-pay | Admitting: Family Medicine

## 2010-08-04 NOTE — Letter (Signed)
Summary: hematology  hematology   Imported By: Lind Guest 07/31/2010 14:43:56  _____________________________________________________________________  External Attachment:    Type:   Image     Comment:   External Document

## 2010-08-04 NOTE — Letter (Signed)
Summary: Discharge Summary  Discharge Summary   Imported By: Lind Guest 07/31/2010 14:44:20  _____________________________________________________________________  External Attachment:    Type:   Image     Comment:   External Document

## 2010-08-04 NOTE — Letter (Signed)
Summary: medical relase   medical relase   Imported By: Lind Guest 07/31/2010 13:50:03  _____________________________________________________________________  External Attachment:    Type:   Image     Comment:   External Document

## 2010-08-05 ENCOUNTER — Telehealth: Payer: Self-pay | Admitting: Family Medicine

## 2010-08-06 ENCOUNTER — Emergency Department (HOSPITAL_COMMUNITY): Payer: Medicare Other

## 2010-08-06 ENCOUNTER — Telehealth: Payer: Self-pay | Admitting: Family Medicine

## 2010-08-06 ENCOUNTER — Ambulatory Visit (INDEPENDENT_AMBULATORY_CARE_PROVIDER_SITE_OTHER): Payer: Medicare Other

## 2010-08-06 ENCOUNTER — Emergency Department (HOSPITAL_COMMUNITY)
Admission: EM | Admit: 2010-08-06 | Discharge: 2010-08-06 | Disposition: A | Discharge status: Home / Self Care | Payer: Medicare Other | Attending: Emergency Medicine | Admitting: Emergency Medicine

## 2010-08-06 ENCOUNTER — Encounter: Payer: Self-pay | Admitting: Family Medicine

## 2010-08-06 DIAGNOSIS — S5010XA Contusion of unspecified forearm, initial encounter: Secondary | ICD-10-CM | POA: Insufficient documentation

## 2010-08-06 DIAGNOSIS — W101XXA Fall (on)(from) sidewalk curb, initial encounter: Secondary | ICD-10-CM | POA: Insufficient documentation

## 2010-08-06 DIAGNOSIS — I10 Essential (primary) hypertension: Secondary | ICD-10-CM | POA: Insufficient documentation

## 2010-08-06 DIAGNOSIS — S335XXA Sprain of ligaments of lumbar spine, initial encounter: Secondary | ICD-10-CM | POA: Insufficient documentation

## 2010-08-06 DIAGNOSIS — G309 Alzheimer's disease, unspecified: Secondary | ICD-10-CM | POA: Insufficient documentation

## 2010-08-06 DIAGNOSIS — J449 Chronic obstructive pulmonary disease, unspecified: Secondary | ICD-10-CM | POA: Insufficient documentation

## 2010-08-06 DIAGNOSIS — E785 Hyperlipidemia, unspecified: Secondary | ICD-10-CM | POA: Insufficient documentation

## 2010-08-06 DIAGNOSIS — F0280 Dementia in other diseases classified elsewhere without behavioral disturbance: Secondary | ICD-10-CM | POA: Insufficient documentation

## 2010-08-06 DIAGNOSIS — M1990 Unspecified osteoarthritis, unspecified site: Secondary | ICD-10-CM | POA: Insufficient documentation

## 2010-08-06 DIAGNOSIS — J4489 Other specified chronic obstructive pulmonary disease: Secondary | ICD-10-CM | POA: Insufficient documentation

## 2010-08-06 DIAGNOSIS — Y92009 Unspecified place in unspecified non-institutional (private) residence as the place of occurrence of the external cause: Secondary | ICD-10-CM | POA: Insufficient documentation

## 2010-08-06 DIAGNOSIS — S0990XA Unspecified injury of head, initial encounter: Secondary | ICD-10-CM | POA: Insufficient documentation

## 2010-08-06 DIAGNOSIS — R350 Frequency of micturition: Secondary | ICD-10-CM

## 2010-08-06 DIAGNOSIS — S7000XA Contusion of unspecified hip, initial encounter: Secondary | ICD-10-CM | POA: Insufficient documentation

## 2010-08-06 LAB — CONVERTED CEMR LAB
Bilirubin Urine: NEGATIVE
Nitrite: NEGATIVE
Urobilinogen, UA: 0.2

## 2010-08-07 ENCOUNTER — Encounter: Payer: Self-pay | Admitting: Family Medicine

## 2010-08-08 LAB — URINE CULTURE: Colony Count: >=100000

## 2010-08-08 LAB — URINALYSIS, ROUTINE W REFLEX MICROSCOPIC
Bilirubin Urine: NEGATIVE
Glucose, UA: NEGATIVE mg/dL
Hgb urine dipstick: NEGATIVE
Protein, ur: NEGATIVE mg/dL
Specific Gravity, Urine: 1.013 (ref 1.005–1.030)
Urobilinogen, UA: 0.2 mg/dL (ref 0.0–1.0)

## 2010-08-08 LAB — WOUND CULTURE

## 2010-08-08 LAB — ANAEROBIC CULTURE

## 2010-08-08 LAB — TISSUE CULTURE
Culture: NO GROWTH
Gram Stain: NONE SEEN

## 2010-08-09 ENCOUNTER — Encounter: Payer: Self-pay | Admitting: Family Medicine

## 2010-08-09 LAB — DIFFERENTIAL
Basophils Relative: 1 % (ref 0–1)
Eosinophils Absolute: 0.1 10*3/uL (ref 0.0–0.7)
Lymphs Abs: 2.4 10*3/uL (ref 0.7–4.0)
Neutro Abs: 6.6 10*3/uL (ref 1.7–7.7)
Neutrophils Relative %: 67 % (ref 43–77)

## 2010-08-09 LAB — COMPREHENSIVE METABOLIC PANEL
ALT: 16 U/L (ref 0–35)
BUN: 7 mg/dL (ref 6–23)
CO2: 30 mEq/L (ref 19–32)
Calcium: 8.7 mg/dL (ref 8.4–10.5)
GFR calc non Af Amer: >60 mL/min (ref >60)
Glucose, Bld: 96 mg/dL (ref 70–99)
Sodium: 135 mEq/L (ref 135–145)

## 2010-08-09 LAB — SURGICAL PCR SCREEN: MRSA, PCR: NEGATIVE

## 2010-08-09 LAB — CBC
HCT: 36.5 % (ref 36.0–46.0)
Hemoglobin: 11.9 g/dL — ABNORMAL LOW (ref 12.0–15.0)
MCH: 31 pg (ref 26.0–34.0)
MCHC: 32.5 g/dL (ref 30.0–36.0)
MCV: 95.5 fL (ref 78.0–100.0)

## 2010-08-09 LAB — PROTIME-INR
INR: 1.02 (ref 0.00–1.49)
Prothrombin Time: 13.3 seconds (ref 11.6–15.2)

## 2010-08-11 NOTE — Letter (Signed)
Summary: [facility]  [facility]   Imported By: Lind Guest 08/04/2010 09:09:31  _____________________________________________________________________  External Attachment:    Type:   Image     Comment:   External Document

## 2010-08-11 NOTE — Progress Notes (Signed)
Summary: medicine  Phone Note Call from Patient   Summary of Call: pt needs to speak with nurse. pt wants some kind of pill [phone] Initial call taken by: Rudene Anda,  August 06, 2010 1:34 PM  Follow-up for Phone Call        needs nurse visit [facility] frequent urination, patient to come today Follow-up by: Adella Hare LPN,  August 06, 2010 1:38 PM
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
## 2010-08-11 NOTE — Progress Notes (Signed)
Summary: speak with nurse  Phone Note Call from Patient   Summary of Call: max needs to speak with nurse. about [place]. she don't know what all nurse told her yesterday. [phone] Initial call taken by: Rudene Anda,  August 05, 2010 10:02 AM  Follow-up for Phone Call        advised patients husband of prediabetes Follow-up by: Adella Hare LPN,  August 05, 2010 10:11 AM

## 2010-08-11 NOTE — Letter (Signed)
Summary: echocardiogram  echocardiogram   Imported By: Lind Guest 08/04/2010 09:10:23  _____________________________________________________________________  External Attachment:    Type:   Image     Comment:   External Document

## 2010-08-11 NOTE — Letter (Signed)
Summary: [facility]  [facility]   Imported By: Lind Guest 08/04/2010 09:09:03  _____________________________________________________________________  External Attachment:    Type:   Image     Comment:   External Document

## 2010-08-12 LAB — RAPID URINE DRUG SCREEN, HOSP PERFORMED
Amphetamines: NOT DETECTED
Barbiturates: NOT DETECTED
Benzodiazepines: POSITIVE — AB
Cocaine: NOT DETECTED
Opiates: POSITIVE — AB
Tetrahydrocannabinol: NOT DETECTED

## 2010-08-12 LAB — CBC
HCT: 35.6 % — ABNORMAL LOW (ref 36.0–46.0)
Hemoglobin: 12.3 g/dL (ref 12.0–15.0)
MCHC: 34.6 g/dL (ref 30.0–36.0)
RDW: 15.1 % (ref 11.5–15.5)

## 2010-08-12 LAB — COMPREHENSIVE METABOLIC PANEL
BUN: 9 mg/dL (ref 6–23)
Calcium: 9.2 mg/dL (ref 8.4–10.5)
Glucose, Bld: 88 mg/dL (ref 70–99)
Total Protein: 6.7 g/dL (ref 6.0–8.3)

## 2010-08-12 LAB — URINALYSIS, ROUTINE W REFLEX MICROSCOPIC
Bilirubin Urine: NEGATIVE
Glucose, UA: NEGATIVE mg/dL
Hgb urine dipstick: NEGATIVE
Ketones, ur: NEGATIVE mg/dL
Nitrite: NEGATIVE
Protein, ur: NEGATIVE mg/dL
Specific Gravity, Urine: <1.005 — ABNORMAL LOW (ref 1.005–1.030)
Urobilinogen, UA: 0.2 mg/dL (ref 0.0–1.0)
pH: 6.5 (ref 5.0–8.0)

## 2010-08-12 LAB — DIFFERENTIAL
Basophils Relative: 0 % (ref 0–1)
Lymphs Abs: 2.2 10*3/uL (ref 0.7–4.0)
Monocytes Relative: 9 % (ref 3–12)
Neutro Abs: 5.7 10*3/uL (ref 1.7–7.7)
Neutrophils Relative %: 65 % (ref 43–77)

## 2010-08-12 LAB — BASIC METABOLIC PANEL
BUN: 11 mg/dL (ref 6–23)
CO2: 37 mEq/L — ABNORMAL HIGH (ref 19–32)
Chloride: 97 mEq/L (ref 96–112)
Creatinine, Ser: 0.63 mg/dL (ref 0.4–1.2)

## 2010-08-12 LAB — POCT CARDIAC MARKERS
CKMB, poc: 1.6 ng/mL (ref 1.0–8.0)
Myoglobin, poc: 80.6 ng/mL (ref 12–200)
Troponin i, poc: <0.05 ng/mL (ref 0.00–0.09)

## 2010-08-14 LAB — DIFFERENTIAL
Lymphocytes Relative: 30 % (ref 12–46)
Lymphs Abs: 2.9 10*3/uL (ref 0.7–4.0)
Monocytes Relative: 7 % (ref 3–12)
Neutro Abs: 6 10*3/uL (ref 1.7–7.7)
Neutrophils Relative %: 62 % (ref 43–77)

## 2010-08-14 LAB — BASIC METABOLIC PANEL
BUN: 15 mg/dL (ref 6–23)
CO2: 30 mEq/L (ref 19–32)
Chloride: 101 mEq/L (ref 96–112)
Creatinine, Ser: 0.83 mg/dL (ref 0.4–1.2)
Glucose, Bld: 100 mg/dL — ABNORMAL HIGH (ref 70–99)

## 2010-08-14 LAB — URINALYSIS, ROUTINE W REFLEX MICROSCOPIC
Hgb urine dipstick: NEGATIVE
Nitrite: NEGATIVE
Specific Gravity, Urine: 1.03 (ref 1.005–1.030)
Urobilinogen, UA: 0.2 mg/dL (ref 0.0–1.0)

## 2010-08-14 LAB — URINE CULTURE

## 2010-08-14 LAB — CBC
Platelets: 316 10*3/uL (ref 150–400)
RBC: 3.83 MIL/uL — ABNORMAL LOW (ref 3.87–5.11)
WBC: 9.8 10*3/uL (ref 4.0–10.5)

## 2010-08-17 ENCOUNTER — Encounter: Payer: Self-pay | Admitting: Family Medicine

## 2010-08-18 ENCOUNTER — Other Ambulatory Visit: Payer: Self-pay | Admitting: Family Medicine

## 2010-08-18 ENCOUNTER — Emergency Department (HOSPITAL_COMMUNITY)
Admission: EM | Admit: 2010-08-18 | Discharge: 2010-08-18 | Disposition: A | Discharge status: Home / Self Care | Payer: Medicare Other | Attending: Emergency Medicine | Admitting: Emergency Medicine

## 2010-08-18 ENCOUNTER — Ambulatory Visit: Payer: Medicare Other | Admitting: Family Medicine

## 2010-08-18 ENCOUNTER — Telehealth: Payer: Self-pay | Admitting: Family Medicine

## 2010-08-18 DIAGNOSIS — F0280 Dementia in other diseases classified elsewhere without behavioral disturbance: Secondary | ICD-10-CM | POA: Insufficient documentation

## 2010-08-18 DIAGNOSIS — J449 Chronic obstructive pulmonary disease, unspecified: Secondary | ICD-10-CM | POA: Insufficient documentation

## 2010-08-18 DIAGNOSIS — J4489 Other specified chronic obstructive pulmonary disease: Secondary | ICD-10-CM | POA: Insufficient documentation

## 2010-08-18 DIAGNOSIS — G8929 Other chronic pain: Secondary | ICD-10-CM | POA: Insufficient documentation

## 2010-08-18 DIAGNOSIS — G309 Alzheimer's disease, unspecified: Secondary | ICD-10-CM | POA: Insufficient documentation

## 2010-08-18 DIAGNOSIS — E785 Hyperlipidemia, unspecified: Secondary | ICD-10-CM | POA: Insufficient documentation

## 2010-08-18 DIAGNOSIS — M549 Dorsalgia, unspecified: Secondary | ICD-10-CM | POA: Insufficient documentation

## 2010-08-18 DIAGNOSIS — K219 Gastro-esophageal reflux disease without esophagitis: Secondary | ICD-10-CM | POA: Insufficient documentation

## 2010-08-18 DIAGNOSIS — F411 Generalized anxiety disorder: Secondary | ICD-10-CM | POA: Insufficient documentation

## 2010-08-18 DIAGNOSIS — I10 Essential (primary) hypertension: Secondary | ICD-10-CM | POA: Insufficient documentation

## 2010-08-18 LAB — BASIC METABOLIC PANEL
CO2: 36 mEq/L — ABNORMAL HIGH (ref 19–32)
Calcium: 8.6 mg/dL (ref 8.4–10.5)
Creatinine, Ser: 0.7 mg/dL (ref 0.4–1.2)
GFR calc Af Amer: >60 mL/min (ref >60)
GFR calc non Af Amer: >60 mL/min (ref >60)

## 2010-08-18 LAB — CBC
HCT: 36.5 % (ref 36.0–46.0)
Hemoglobin: 11.6 g/dL — ABNORMAL LOW (ref 12.0–15.0)
MCH: 29.9 pg (ref 26.0–34.0)
MCV: 94.1 fL (ref 78.0–100.0)
RBC: 3.88 MIL/uL (ref 3.87–5.11)

## 2010-08-18 LAB — RAPID URINE DRUG SCREEN, HOSP PERFORMED
Amphetamines: NOT DETECTED
Barbiturates: NOT DETECTED
Benzodiazepines: POSITIVE — AB
Tetrahydrocannabinol: NOT DETECTED

## 2010-08-18 LAB — DIFFERENTIAL
Basophils Relative: 0 % (ref 0–1)
Lymphocytes Relative: 30 % (ref 12–46)
Lymphs Abs: 2.7 10*3/uL (ref 0.7–4.0)
Monocytes Absolute: 0.6 10*3/uL (ref 0.1–1.0)
Monocytes Relative: 7 % (ref 3–12)
Neutro Abs: 5.2 10*3/uL (ref 1.7–7.7)
Neutrophils Relative %: 57 % (ref 43–77)

## 2010-08-18 NOTE — Telephone Encounter (Signed)
Both s[pouse and pt report mental deterioration, she states "things are not going well" spouse staes she is again not recognizing him as her spouse, he agrees she needs help. Denies she is a physical threat at this time. They both agree to taking her to [facility] ED for eval, I spoke directly with the Ed doc also

## 2010-08-20 NOTE — Miscellaneous (Signed)
  Clinical Lists Changes  Medications: Removed medication of DOXYCYCLINE HYCLATE 100 MG CAPS (DOXYCYCLINE HYCLATE) Take 1 tablet by mouth two times a day Removed medication of SEPTRA DS 800-160 MG TABS (SULFAMETHOXAZOLE-TRIMETHOPRIM) Take 1 tablet by mouth two times a day Added new medication of NITROFURANTOIN MONOHYD MACRO 100 MG CAPS (NITROFURANTOIN MONOHYD MACRO) Take 1 capsule by mouth two times a day - Signed Rx of NITROFURANTOIN MONOHYD MACRO 100 MG CAPS (NITROFURANTOIN MONOHYD MACRO) Take 1 capsule by mouth two times a day;  #10 x 0;  Signed;  Entered by: Syliva Overman MD;  Authorized by: Syliva Overman MD;  Method used: Historical    Prescriptions: NITROFURANTOIN MONOHYD MACRO 100 MG CAPS (NITROFURANTOIN MONOHYD MACRO) Take 1 capsule by mouth two times a day  #10 x 0   Entered and Authorized by:   Syliva Overman MD   Signed by:   Syliva Overman MD on 08/09/2010   Method used:   Historical   RxID:   [id number]

## 2010-08-20 NOTE — Assessment & Plan Note (Signed)
Summary: ccua   Vitals Entered By: Adella Hare LPN (August 06, 2010 3:14 PM) CC: complains of urinary frequency   CC:  complains of urinary frequency.  Allergies: 1)  ! Pcn 2)  ! Codeine 3)  ! * Duragesic 4)  ! Levaquin 5)  ! Morphine 6)  ! Prednisone 7)  ! * Ciprofloxacin 8)  ! Prednisone 9)  ! * Diltiazem   Complete Medication List: 1)  Hydrocodone-acetaminophen 7.5-750 Mg Tabs (Hydrocodone-acetaminophen) .... Take one tablet every 6-8 hours 2)  Alprazolam 0.5 Mg Tbdp (Alprazolam) .Marland Kitchen.. 1 in the morning and 2 at bedtime 3)  Symbicort 80-4.5 Mcg/act Aero (Budesonide-formoterol fumarate) .... 2 pufs twice daily 4)  Albuterol Sulfate (2.5 Mg/34ml) 0.083% Nebu (Albuterol sulfate) .... One vial per nebulizer four times a day  as needed 5)  Clobetasol Propionate 0.05 % Crea (Clobetasol propionate) .... Apply topically two times a day 6)  Pantoprazole Sodium 40 Mg Tbec (Pantoprazole sodium) .... Take 1 daily for heartburn 7)  Furosemide 20 Mg Tabs (Furosemide) .... Take 1 tablet by mouth once a day  as needed for  leg swelling 8)  Klor-con M20 20 Meq Cr-tabs (Potassium chloride crys cr) .... Take 1 tablet by mouth once a day as needed ypou need to take the potassium pill every day you tske the furosemide 9)  Citalopram Hydrobromide 10 Mg Tabs (Citalopram hydrobromide) .... Take 1 tablet by mouth once a day 10)  Triple Antibiotic [phone] Oint (Neomycin-bacitracin-polymyxin) .... Apply twice daily for 1 week , then as needed, to rash 11)  Amlodipine Besylate 5 Mg Tabs (Amlodipine besylate) .... Take 1 tablet by mouth once a day discontinue diltiazem effective 07/17/2010 12)  Proventil Hfa 108 (90 Base) Mcg/act Aers (Albuterol sulfate) .... 2 puffs 3 times a day 13)  Exelon 4.6 Mg/24hr Pt24 (Rivastigmine) .Marland Kitchen.. 1 patch every 24 hours 14)  Doxycycline Hyclate 100 Mg Caps (Doxycycline hyclate) .... Take 1 tablet by mouth two times a day 15)  Prednisone 10 Mg Tabs (Prednisone) .... Take 1  tablet by mouth once a day 16)  Septra Ds 800-160 Mg Tabs (Sulfamethoxazole-trimethoprim) .... Take 1 tablet by mouth two times a day  Other Orders: Urinalysis ([id number]) T-Culture, Urine ([id number]) Prescriptions: SEPTRA DS 800-160 MG TABS (SULFAMETHOXAZOLE-TRIMETHOPRIM) Take 1 tablet by mouth two times a day  #10 x 0   Entered and Authorized by:   Syliva Overman MD   Signed by:   Syliva Overman MD on 08/06/2010   Method used:   Electronically to        [facility]  [place] [address] (retail)       [address]       [place], [place]  [address]       Ph: [phone] or [phone]       Fax: [phone]   RxID:   [id number]    Orders Added: 1)  Urinalysis [[id number]] 2)  T-Culture, Urine [[id number]]   Urinalysis shows positve leukocytes, septra sent in, specimen to be cultured Laboratory Results   Urine Tests  Date/Time Received: August 06, 2010 3:15 PM  Date/Time Reported: August 06, 2010 3:15 PM   Routine Urinalysis   Color: yellow Appearance: Clear Glucose: negative   (Normal Range: Negative) Bilirubin: negative   (Normal Range: Negative) Ketone: negative   (Normal Range: Negative) Spec. Gravity: <1.005   (Normal Range: 1.003-1.035) Blood: trace-intact   (Normal Range: Negative) pH: 6.5   (Normal Range: 5.0-8.0) Protein: negative   (Normal Range: Negative) Urobilinogen:  0.2   (Normal Range: 0-1) Nitrite: negative   (Normal Range: Negative) Leukocyte Esterace: small   (Normal Range: Negative)

## 2010-08-25 ENCOUNTER — Encounter: Payer: Self-pay | Admitting: Family Medicine

## 2010-08-27 ENCOUNTER — Ambulatory Visit: Payer: Medicare Other | Admitting: Family Medicine

## 2010-09-09 ENCOUNTER — Telehealth: Payer: Self-pay | Admitting: Family Medicine

## 2010-09-09 DIAGNOSIS — F0390 Unspecified dementia without behavioral disturbance: Secondary | ICD-10-CM

## 2010-09-09 DIAGNOSIS — J449 Chronic obstructive pulmonary disease, unspecified: Secondary | ICD-10-CM

## 2010-09-09 DIAGNOSIS — M6281 Muscle weakness (generalized): Secondary | ICD-10-CM

## 2010-09-09 DIAGNOSIS — K219 Gastro-esophageal reflux disease without esophagitis: Secondary | ICD-10-CM

## 2010-09-09 NOTE — Telephone Encounter (Signed)
Noted and agree. 

## 2010-09-09 NOTE — Telephone Encounter (Signed)
Advised daughter to call 911 and have her found and taken to the ER. She said that they have already picked her up already

## 2010-09-09 NOTE — Telephone Encounter (Signed)
[name] is in the hospital at [facility] on life support for pneumonia and [name] has been out of it, not recognizing her daughter, not knowing where she is, she took off on her walker and was found at the neighbors and she left again on her walker headed to [place].  She wanted her daughter evicted and is on her way to [name] to have her daughter arrested because she is not recognizing her. I advised daughter she needs to get her to the ER but she doesn't know where she is, thinks she is headed to the sheriffs office. She is so worried and doesn't know what to do. Please advise.

## 2010-09-13 ENCOUNTER — Emergency Department (HOSPITAL_COMMUNITY)
Admission: EM | Admit: 2010-09-13 | Discharge: 2010-09-13 | Disposition: A | Discharge status: Home / Self Care | Payer: Medicare Other | Attending: Emergency Medicine | Admitting: Emergency Medicine

## 2010-09-13 DIAGNOSIS — J4489 Other specified chronic obstructive pulmonary disease: Secondary | ICD-10-CM | POA: Insufficient documentation

## 2010-09-13 DIAGNOSIS — F411 Generalized anxiety disorder: Secondary | ICD-10-CM | POA: Insufficient documentation

## 2010-09-13 DIAGNOSIS — J449 Chronic obstructive pulmonary disease, unspecified: Secondary | ICD-10-CM | POA: Insufficient documentation

## 2010-09-13 DIAGNOSIS — F3289 Other specified depressive episodes: Secondary | ICD-10-CM | POA: Insufficient documentation

## 2010-09-13 DIAGNOSIS — F0280 Dementia in other diseases classified elsewhere without behavioral disturbance: Secondary | ICD-10-CM | POA: Insufficient documentation

## 2010-09-13 DIAGNOSIS — N390 Urinary tract infection, site not specified: Secondary | ICD-10-CM | POA: Insufficient documentation

## 2010-09-13 DIAGNOSIS — M1990 Unspecified osteoarthritis, unspecified site: Secondary | ICD-10-CM | POA: Insufficient documentation

## 2010-09-13 DIAGNOSIS — E785 Hyperlipidemia, unspecified: Secondary | ICD-10-CM | POA: Insufficient documentation

## 2010-09-13 DIAGNOSIS — K219 Gastro-esophageal reflux disease without esophagitis: Secondary | ICD-10-CM | POA: Insufficient documentation

## 2010-09-13 DIAGNOSIS — G309 Alzheimer's disease, unspecified: Secondary | ICD-10-CM | POA: Insufficient documentation

## 2010-09-13 DIAGNOSIS — Z79899 Other long term (current) drug therapy: Secondary | ICD-10-CM | POA: Insufficient documentation

## 2010-09-13 DIAGNOSIS — I10 Essential (primary) hypertension: Secondary | ICD-10-CM | POA: Insufficient documentation

## 2010-09-13 DIAGNOSIS — F329 Major depressive disorder, single episode, unspecified: Secondary | ICD-10-CM | POA: Insufficient documentation

## 2010-09-13 LAB — URINALYSIS, ROUTINE W REFLEX MICROSCOPIC
Glucose, UA: NEGATIVE mg/dL
Hgb urine dipstick: NEGATIVE
Leukocytes, UA: NEGATIVE
Nitrite: POSITIVE — AB
Urobilinogen, UA: 0.2 mg/dL (ref 0.0–1.0)

## 2010-09-13 LAB — URINE MICROSCOPIC-ADD ON

## 2010-09-14 ENCOUNTER — Ambulatory Visit: Payer: Self-pay | Admitting: Family Medicine

## 2010-09-15 LAB — URINE CULTURE

## 2010-09-21 ENCOUNTER — Telehealth: Payer: Self-pay | Admitting: Family Medicine

## 2010-09-21 NOTE — Telephone Encounter (Signed)
Noted, attempted to call and left message at her home, she was reportedly asleep

## 2010-09-25 ENCOUNTER — Other Ambulatory Visit: Payer: Self-pay | Admitting: Family Medicine

## 2010-10-02 ENCOUNTER — Emergency Department (HOSPITAL_COMMUNITY): Payer: Medicare Other

## 2010-10-02 ENCOUNTER — Emergency Department (HOSPITAL_COMMUNITY)
Admission: EM | Admit: 2010-10-02 | Discharge: 2010-10-02 | Disposition: A | Discharge status: Home / Self Care | Payer: Medicare Other | Attending: Emergency Medicine | Admitting: Emergency Medicine

## 2010-10-02 DIAGNOSIS — M549 Dorsalgia, unspecified: Secondary | ICD-10-CM | POA: Insufficient documentation

## 2010-10-02 DIAGNOSIS — L03119 Cellulitis of unspecified part of limb: Secondary | ICD-10-CM | POA: Insufficient documentation

## 2010-10-02 DIAGNOSIS — R05 Cough: Secondary | ICD-10-CM | POA: Insufficient documentation

## 2010-10-02 DIAGNOSIS — M79609 Pain in unspecified limb: Secondary | ICD-10-CM | POA: Insufficient documentation

## 2010-10-02 DIAGNOSIS — G8929 Other chronic pain: Secondary | ICD-10-CM | POA: Insufficient documentation

## 2010-10-02 DIAGNOSIS — L02419 Cutaneous abscess of limb, unspecified: Secondary | ICD-10-CM | POA: Insufficient documentation

## 2010-10-02 DIAGNOSIS — F341 Dysthymic disorder: Secondary | ICD-10-CM | POA: Insufficient documentation

## 2010-10-02 DIAGNOSIS — S8010XA Contusion of unspecified lower leg, initial encounter: Secondary | ICD-10-CM | POA: Insufficient documentation

## 2010-10-02 DIAGNOSIS — J4489 Other specified chronic obstructive pulmonary disease: Secondary | ICD-10-CM | POA: Insufficient documentation

## 2010-10-02 DIAGNOSIS — M25559 Pain in unspecified hip: Secondary | ICD-10-CM | POA: Insufficient documentation

## 2010-10-02 DIAGNOSIS — I10 Essential (primary) hypertension: Secondary | ICD-10-CM | POA: Insufficient documentation

## 2010-10-02 DIAGNOSIS — M1990 Unspecified osteoarthritis, unspecified site: Secondary | ICD-10-CM | POA: Insufficient documentation

## 2010-10-02 DIAGNOSIS — J449 Chronic obstructive pulmonary disease, unspecified: Secondary | ICD-10-CM | POA: Insufficient documentation

## 2010-10-02 DIAGNOSIS — K219 Gastro-esophageal reflux disease without esophagitis: Secondary | ICD-10-CM | POA: Insufficient documentation

## 2010-10-02 DIAGNOSIS — Z79899 Other long term (current) drug therapy: Secondary | ICD-10-CM | POA: Insufficient documentation

## 2010-10-02 DIAGNOSIS — R059 Cough, unspecified: Secondary | ICD-10-CM | POA: Insufficient documentation

## 2010-10-02 DIAGNOSIS — M25569 Pain in unspecified knee: Secondary | ICD-10-CM | POA: Insufficient documentation

## 2010-10-02 DIAGNOSIS — E785 Hyperlipidemia, unspecified: Secondary | ICD-10-CM | POA: Insufficient documentation

## 2010-10-02 DIAGNOSIS — G309 Alzheimer's disease, unspecified: Secondary | ICD-10-CM | POA: Insufficient documentation

## 2010-10-02 DIAGNOSIS — W06XXXA Fall from bed, initial encounter: Secondary | ICD-10-CM | POA: Insufficient documentation

## 2010-10-02 DIAGNOSIS — F0280 Dementia in other diseases classified elsewhere without behavioral disturbance: Secondary | ICD-10-CM | POA: Insufficient documentation

## 2010-10-02 DIAGNOSIS — Y92009 Unspecified place in unspecified non-institutional (private) residence as the place of occurrence of the external cause: Secondary | ICD-10-CM | POA: Insufficient documentation

## 2010-10-05 ENCOUNTER — Encounter: Payer: Self-pay | Admitting: Family Medicine

## 2010-10-06 NOTE — Op Note (Signed)
[name], [name]               ACCOUNT NO.:  [id number]   MEDICAL RECORD NO.:  [id number]          PATIENT TYPE:  INP   LOCATION:  5038                         FACILITY:  [facility]   PHYSICIAN:  Doralee Albino. Carola Frost, M.D. DATE OF BIRTH:  November 17, 1934   DATE OF PROCEDURE:  07/13/2007  DATE OF DISCHARGE:                               OPERATIVE REPORT   PREOPERATIVE DIAGNOSIS:  1. Severe left bicondylar tibial plateau fracture.  2. Left bimalleolar ankle fracture.   POSTOPERATIVE DIAGNOSES:  1. Severe left bicondylar tibial plateau fracture.  2. Left bimalleolar ankle fracture.   PROCEDURE:  1. Open reduction and internal fixation of left bicondylar tibial      plateau fracture.  2. Open reduction and internal fixation of bimalleolar ankle fracture.  3. Prophylactic anterior compartment fasciotomy.   SURGEON:  Doralee Albino. Carola Frost, M.D.   ASSISTANT:  Mearl Latin, P.A.-C.   ANESTHESIA:  General.   COMPLICATIONS:  None.   TOURNIQUET TIME:  None.   DRAINS:  One medium Hemovac anterior compartment of the leg.   ESTIMATED BLOOD LOSS:  250 mL.   BLOOD REPLACED:  2 units packed red blood cells, immediate postop  hematocrit 30.   DISPOSITION:  To the PACU.   CONDITION:  Stable.   INDICATIONS FOR PROCEDURE:  [name] is a 76 year old female who  sustained left bicondylar tibial plateau and a left bimalleolar ankle  fracture approximately four days ago.  She underwent a period of  splinting, aggressive elevation, ice and compression, which resulted in  significant soft tissue swelling resolution to the point that her skin  wrinkled in the anticipated area of incision.  We discussed with the  family the risks and benefits of surgery including the possibility of  infection, nerve injury, vessel injury, wound breakdown, arthritis,  decreased range of motion, need for further surgery, DVT, PE, heart  attack, stroke and others.  After a full discussion, they wished to  proceed.   DESCRIPTION OF PROCEDURE:  [name] was administered preoperative  antibiotics and taken to the operating room where general anesthesia was  induced.  Her left lower extremity was prepped and draped in the usual  sterile fashion.  A tourniquet was placed about the thigh but never  inflated during the case.  I made an anterolateral incision extending  back over Gertie's tubercle.  We booked open the anterolateral fracture  site, placed a lamina spreader, and examined the meniscus.  There was no  major meniscal tear nor detachment.  We irrigated the joint thoroughly.  There were multiple articular segments.  We then used the Cobb to  elevate up into the appropriate position.  Behind the Cobb, we then took  the bone tamp to further elevate and compress these articular segments  against the femoral condyle.  Unfortunately, her bone quality was  exceedingly poor such that you could actually push your thumb through it  which we were, of course, careful not to do.  We then obtained  compression and provisional reduction across the joint with manual  force, again being careful not pass any instruments through  the bone  into the metaphysis.  After obtaining provisional reduction, we did  check the well leg and then adjust our alignment.  We selected a 3.5  lock plate which would provide the best rafter effect.  We placed these  rafter screws and then followed this with a single screw into the shaft,  again, while being very careful to watch for appropriate alignment.  We  were unable to completely restore elevation to her lateral side but were  able to control the alignment fairly well, nonetheless.  We then placed  multiple locked screws into the medial plateau as well as into the  shaft.  We then added 5 mL of cement.   We then developed the plane above and below the anterior compartment  fascia and used the curved Mayo's to perform an anterior fasciotomy as  prophylaxis against compartment  syndrome.  We then irrigated and closed  in standard layered fashion after placing a drain into the anterior  compartment, as well.   We turned our attention distally and placed two percutaneous screws into  the medial malleolus obtaining excellent compression.  The compression  of the posterior screw was outstanding, however, the anterior screw did  seem to strip and consequently we removed that screw to prevent  subsequent hardware migration.  On the lateral side, a standard lateral  approach was performed carefully looking for the superficial peroneal  nerve and proximal portion of the wound and retracting this tissues  anteriorly. Reduction was obtained and maintained with a lobster claw  which again demonstrated exceedingly soft bone.  Consequently, we  applied a locked 1/3 tubular plate, first placing two standard screws to  maximally appose the plate to the bone.  We irrigated here and closed in  standard layered fashion, as well, with Vicryl and 3-0 nylon.  The knee  wound was closed with vertical mattress 3-0 nylon.  A sterile gently  compressive dressing was applied including a posterior stirrup splint  for the ankle.  The patient was then awakened from anesthesia and  transferred to the PACU in stable condition.   PROGNOSIS:  [name] has had a severe injury to her knee and ankle.  Her osteoporosis is quite concerning and would appear to benefit from  treatment.  Given the articular destruction, she may require subsequent  total knee arthroplasty, but preservation of alignment if she can  maintain compliance and heals in her current position and preservation  of the meniscus may mitigate against this outcome.  She will be on DVT  prophylaxis for the next six weeks with Coumadin.      Doralee Albino. Carola Frost, M.D.  Electronically Signed     MHH/MEDQ  D:  07/13/2007  T:  07/14/2007  Job:  [id number]

## 2010-10-06 NOTE — H&P (Signed)
[name], [name]               ACCOUNT NO.:  [id number]   MEDICAL RECORD NO.:  [id number]          PATIENT TYPE:  INP   LOCATION:  A310                          FACILITY:  [facility]   PHYSICIAN:  Catalina Pizza, M.D.        DATE OF BIRTH:  1934-08-30   DATE OF ADMISSION:  09/23/2006  DATE OF DISCHARGE:  LH                              HISTORY & PHYSICAL   PRIMARY MEDICAL DOCTOR:  Dr. Syliva Overman.   CHIEF COMPLAINT:  Worsening wheezing and shortness of breath.   HISTORY OF PRESENT ILLNESS:  [name] is a 76 year old white female,  morbidly obese with history of asthma, gastroesophageal reflux disease,  hypertension and apparent COPD.  Ms. Wexler came to the emergency  department on April 13 with similar type occurrence, started on a long  slow taper of prednisone, unclear if she took all those prescribed.  At  that time, she states that she had been stopped on prednisone for the  last 2 weeks, but she continues to have worsening wheezing.  She called  for appointment with Dr. Lodema Hong which apparently was going to be later  today, but was unable to make that appointment secondary to worsening  shortness of breath.  Came into the emergency department, and on ABG  found to have significant hypoxemia with pO2 of 57.9.  This appears to  be well compensated, though it may be very close to her baseline.  She  continues to smoke approximately one pack per day, although she knows  that this is not get for her.  She does not have any other complaints at  this time.   PAST MEDICAL HISTORY:  1. Gastroesophageal reflux disease.  2. Morbid obesity.  3. History of asthma.  4. Previous COPD exacerbations.  5. History of lumbar disk disease.  6. History of scoliosis.  7. History of anxiety/depression.   MEDICATIONS:  1. Hydrocodone 5/500 p.o. t.i.d. for lower back pain.  2. Advair discus b.i.d.  3. Singulair 10 mg p.o. daily, question Spiriva discus daily.  4. Cardizem LA 360 mg p.o.  daily.  5. Xanax 0.5 mg p.r.n.  6. Albuterol nebulizer one to two times per day.  7. Zegerid once daily.  8. Proventil inhaler.   FAMILY HISTORY:  Essentially noncontributory at this time.   SOCIAL HISTORY:  The patient to smoke one pack per day and has been  doing so for greater than 40 years.  Denies any significant drinking.  She is married and lives with her husband in [place].  She has four  children, three which live in the area.   ALLERGIES:  PENICILLIN WHICH CAUSES PALPITATIONS, OXYCODONE WHICH CAUSES  HALLUCINATIONS, SULFA LISTED AS ALLERGY, BUT UNCLEAR OF THIS EXACTLY.   REVIEW OF SYSTEMS:  The patient denies any change in her vision.  No  weight loss, weight gain.  Denies any chest pain.  Positive for dyspnea  on exertion and worsening shortness of breath.  No abdominal pains.  No  problems with bowel or bladder.  No significant lower extremity edema.  No numbness, tingling in hands or  feet.  No other neurologic complaints.   PHYSICAL EXAMINATION:  VITAL SIGNS:  Temperature is 98.6, blood pressure  132/72, respirations 20, heart rate 105, pulse oximetry 90% on room air.  GENERAL:  This is an obese white female, sitting upright in bed without  significant lung retractions and use of accessory muscles.  She had  audible rhonchi without assessed scope.  NECK:  Supple.  No JVD.  No thyromegaly.  HEENT:  Mucous membranes are moist.  LUNGS:  Show poor air movement throughout.  Some inspiratory and  expiratory wheezing, but apparently has improved significantly since  breathing treatments.  Long expiratory phase.  HEART:  Is regular rate and rhythm.  Do not appreciate any murmur but  distant heart sounds.  ABDOMEN:  Protuberant, but soft, nontender.  EXTREMITIES:  No lower extremity edema.  Does have significant  varicosities in lower extremities but no other skin breakdown or rash.  NEUROLOGIC: Alert and oriented x3.  Cranial nerves II-XII intact.  No  signs of tremor  and no signs of significant weakness.   LABORATORY DATA:  Lab work that was obtained showed a CBC 9.7,  hemoglobin 12.3, platelet count 312.  B-met shows sodium 134, potassium  4.1, chloride 99, CO2 27, glucose 98, BUN 7, creatinine 0.81.  BNP less  than 30.  D-dimer mildly elevated 0.63.  ABG on room air showed pH of  7.4, pCO2 of 40, pO2 of 57.9, oxygen saturation of 91.2.  Chest x-ray  obtained shows mild COPD and chronic bronchitis, some lingular  atelectasis.   IMPRESSION:  This is a 76 year old white female with apparent COPD  exacerbation.   ASSESSMENT/PLAN:  1. COPD exacerbation.  Will be started on Levaquin 750 mg IV daily      along with albuterol, Atrovent and Solu-Medrol.  Will also start on      Mucinex given thick coarse breath sounds that she is having.  2. Ongoing tobacco use.  The patient again is educated on quitting and      will need to continue to work on this throughout hospitalization      and from her primary care physician as well.  3. Chronic back pain.  She apparently is under control with the      Vicodin will continue that.  4. Gastroesophageal reflux disease.  Will start her on Protonix      instead of the Zegerid during hospitalization.  5. Hypertension.  Continue with the Cardizem LA routinely.   DISPOSITION:  The patient will be admitted to a regular bed.  Her Advair  and Spiriva will be on hold secondary to her acute illness at this time.      Catalina Pizza, M.D.  Electronically Signed     ZH/MEDQ  D:  09/23/2006  T:  09/23/2006  Job:  [id number]

## 2010-10-06 NOTE — Consult Note (Signed)
[name], [name]               ACCOUNT NO.:  [id number]   MEDICAL RECORD NO.:  [id number]          PATIENT TYPE:  INP   LOCATION:  1602                         FACILITY:  [facility]   PHYSICIAN:  Marcellus Scott, MD     DATE OF BIRTH:  [birth year]   DATE OF CONSULTATION:  07/10/2007  DATE OF DISCHARGE:                                 CONSULTATION   PRIMARY CARE PHYSICIAN:  Milus Mallick. Lodema Hong, M.D.   Gastroenterologist, Dr. Lovell Sheehan in [place], [place].   REFERRING PHYSICIAN:  Dyke Brackett, M.D.   REASON FOR CONSULTATION:  Preoperative clearance and management of  medical issues.   CHIEF COMPLAINT:  Left leg pain and altered mental status.   HISTORIANS:  Cloria Spring, the patient's son and Louie Bun, the patient's  daughter, who are at bedside.   HISTORY OF PRESENT ILLNESS:  Ms. [name] is a pleasant, 76 year old,  Caucasian female patient with past medical history as indicated below.  She lives with her husband at her home.  At baseline, the patient is  said to have intermittent confusion.  She apparently has a high bed for  which she uses a stool to get on to.  The events of last night are not  fully clear.  It appears that the patient was attempting to get on her  bed using her stool when she lost her foot hold and fell backwards.  She  is said to have hit her head and fell to the floor.  There is no history  of loss of consciousness or bleeding.  The patient subsequently called  her son on the phone and was complaining of pain in her left lower  extremity.  The patient's spouse called EMS and the patient was brought  to the emergency room where she was noted to have left leg fractures.  The patient received morphine in the emergency room.  This a.m., when  the patient was moved to the medical floor at approximately 4 a.m., she  was agitated and more confused than her usual self.  The patient's  children indicate that whenever she has taken morphine in the past, this  is how she reacts to the morphine.  However, she seems to be improving  now that the morphine has been held.  The patient denies pain anywhere.  She denies any chest pain, difficulty breathing or cough.   PAST MEDICAL HISTORY:  1. Chronic back pain.  2. Hypertension.  3. Extrinsic allergies.  4. Dysphagia, status post EGD dilatation.  Last EGD a month ago did      not show any dilated lesion.  5. COPD on no home oxygen.  6. Gastroesophageal reflux disease.  7. Obesity.  8. Hemorrhoids.  9. Hyperlipidemia.  10.Urinary tract infections.   PAST SURGICAL HISTORY:  1. Right carpal tunnel release.  2. Total abdominal hysterectomy.  3. Hysterectomy.  4. Cholecystectomy.  5. Cervical diskectomy x2.  6. Lumbar laminectomy.  7. Right knee arthroscopy.  8. Bladder duct surgery.   PSYCHIATRIC HISTORY:  1. Anxiety.  2. Depression.   ALLERGIES:  PENICILLIN, CODEINE, OXYCODONE, ACETAMINOPHEN, MORPHINE  INTOLERANCE.   HOME MEDICATIONS:  1. Xanax 1 mg p.o. nightly; 0.5 mg p.o. a.m.  2. Zegerid 40 mg p.o. daily.  3. Cardizem 360 mg p.o. daily.   FAMILY HISTORY:  The patient's mother had history of ovarian cancer and  died at age 41 from a massive MI.  The patient's sister alive with  ovarian cancer.  The patient's younger sister with history of  osteogenesis imperfecta.   SOCIAL HISTORY:  The patient is married.  As indicated, she lives with  her spouse.  She uses elective wheelchair and walker and said to be  independent of activities of daily living.  She quit smoking 1 year ago.  She had a 30-pack-year smoking history prior to that.  She quit heavy  alcohol intake 15 years ago.   REVIEW OF SYSTEMS:  Fourteen systems reviewed and apart from history of  present illness is noncontributory.   PHYSICAL EXAMINATION:  GENERAL:  Ms. Monds is a moderately built and  obese, female patient in no obvious distress.  VITAL SIGNS:  Temperature 101.7 degrees Fahrenheit, pulse 91 per minute   and regular, respirations 17 per minute, blood pressure 132/93 mmHg,  saturations 95% on 1 L of oxygen and nasal cannula.  HEENT:  Atraumatic, normocephalic.  Pupils equal round and reactive to  light and accommodation.  Left immature cataract.  Right intraocular  lens.  Oral cavity with no oropharyngeal erythema or thrush.  NECK:  Supple.  No JVD or carotid bruit.  LYMPHATICS:  No lymphadenopathy.  BREASTS:  exam Deferred.  LUNGS:  Poor effort, but clear to auscultation  CARDIAC:  S1, S2 heard.  No S3, S4 or murmurs.  ABDOMEN:  Obese.  Laparotomy scars, nontender.  No organomegaly or mass  appreciated.  Bowel sounds are normally heard.  NEUROLOGIC:  The patient is drowsy, but easily arousable.  Oriented to  person, but not in place or time.  The patient, however, obeys 100%  commands.  No focal neurological deficits.  EXTREMITIES:  No clubbing, cyanosis or edema.  Peripheral pulses are  symmetrically felt.  Left lower extremity in Ace wrapping.  SKIN:  Without any rashes.  MUSCULOSKELETAL:  Unremarkable.   LABORATORY DATA AND X-RAY FINDINGS:  INR 0.9.  Comprehensive metabolic  panel is unremarkable with BUN of 6, creatinine 0.73.  Total protein  5.7, albumin 2.9.  Urinalysis negative for features of urinary tract  infection.  CBCs with hemoglobin 10.3, hematocrit 30.3, white blood  cells 13.1, platelets 316.   CT of the head reported as atrophy.  No acute abnormality.  CT of the  cervical spine with no fractures, osteopenia.  Fusion of the C5-6, C6-7  with DDD; C4-5.  X-ray of the left lower extremity with medial and  lateral malleolar fractures without displacement.  X-ray of the left  lower extremity depressed lateral tibial plateau fracture, nondepressed  medial tibial plateau fracture, small effusion, fracture of left fibular  neck.  X-ray of the hip with degenerative changes of the left hip with  no acute fracture.  Chest x-ray which has been reviewed by me with no  acute  abnormality.  EKG which is normal sinus rhythm at 92 beats per  minute, normal axis with nonspecific ST changes with no acute findings   ASSESSMENT/RECOMMENDATIONS:  1. Left medial and lateral tibial plateau and bimalleolar fractures.      The patient is planned for open reduction internal fixation for      July 11, 2007, at [facility].  The patient is at low      risk for perioperative event and may proceed with surgery.  She      will be transferred to [facility] for this procedure.  2. Acute on chronic change in mental status, possibly secondary to      medications of morphine.  It seems to be improving.  CT of head and      urinalysis are negative.  Hold morphine and monitor.  3. Hypertension, fairly controlled.  Continue with Cardizem.  4. Chronic obstructive pulmonary disease, stable -for nebulizations      p.r.n. and oxygen.  5. Febrile illness.  Probably a reaction to problem #1.  Will get      blood cultures and monitor off antibiotics.  6. Anxiety.  Xanax when the patient is awake and alert.  7. Anemia.  Follow up CBC.  8. Leukocytosis, probably a stress reaction.   Thank you for letting us participate in this patient's care.  We will  follow Ms. Lukin along with you.      Marcellus Scott, MD  Electronically Signed     AH/MEDQ  D:  07/10/2007  T:  07/10/2007  Job:  [id number] [id number]   cc:   Milus Mallick. Lodema Hong, M.D.  Fax: [phone]   Dalia Heading, M.D.  Fax: [phone]   Dyke Brackett, M.D.  Fax: [phone]

## 2010-10-06 NOTE — Discharge Summary (Signed)
[name], [name]               ACCOUNT NO.:  [id number]   MEDICAL RECORD NO.:  [id number]          PATIENT TYPE:  INP   LOCATION:  5038                         FACILITY:  [facility]   PHYSICIAN:  Doralee Albino. Carola Frost, M.D. DATE OF BIRTH:  [birth year]   DATE OF ADMISSION:  07/10/2007  DATE OF DISCHARGE:                               DISCHARGE SUMMARY   DISCHARGE DIAGNOSES:  1. Left bicondylar tibial plateau fracture.  2. Left bimalleolar ankle fracture.   ADDITIONAL DISCHARGE DIAGNOSES:  1. Anxiety.  2. Depression.  3. Chronic back pain.  4. Hypertension.  5. Seasonal allergies.  6. Status post esophagogastroduodenoscopy dilatation for dysphagia.  7. Chronic obstructive pulmonary disease.  8. Gastroesophageal reflux disease.  9. Obesity.  10.Hyperlipidemia.   PROCEDURES PERFORMED:  1. Open reduction and internal fixation of the left bicondylar tibial      plateau fracture.  2. Open reduction and internal fixation of the left bimalleolar ankle      fracture.  3. Prophylactic anterior compartment fasciotomy.   BRIEF HISTORY AND HOSPITAL COURSE:  [name] is a very pleasant 76-  year-old Caucasian female who presented on July 10, 2007.  The  patient states that she was at home and was attempting to get into bed  using a stool when she lost her footing and fell backwards.  She  subsequently landed on her left lower extremity, resulting in fractures  of the left tibial plateau and bimalleolar left ankle fracture.  The  patient was brought to the emergency room by EMS and was evaluated where  it was shown she did, in fact, fracture her left tibial plateau and left  ankle.  The patient was initially see at [facility].  But given the severity of the fracture, it was deemed appropriate to  transfer to the patient to [facility] which was done on the  same day.  The patient was seen and evaluated the next day and it was  determined that her soft tissue  was inappropriate for definitive  fixation at that time.  After 2 days of close observation, it was deemed  appropriate and safe to proceed with surgery on July 13, 2007 as the  patient's soft tissue swelling had resolved significantly to permit  definitive fixation.  The patient was then brought to the operating room  on July 13, 2007 for the procedures as described above, and she  tolerated the procedures exceptionally well.   The patient did receive blood during her hospitalization.  However, the  blood that she received was intraoperatively, which she received 2 units  of packed red blood cells, which elevated the patient's hematocrit to 30  postoperatively.  During the remainder of the stay the patient did not  require any additional units of packed red blood cells for acute blood  loss anemia. The patient was also being followed by internal medicine  service during her stay here at the hospital to assist with management  of the patient.  This was greatly appreciated on behalf of the  orthopedics service.  It is important to note that  the patient did  receive morphine at several points during her hospitalization, and it  was noted that the patient had substantial mental status changes upon  receiving morphine.  Therefore, the patient's pain was controlled with  oral medications and appropriate pain management was achieved utilizing  oral pain medications.   The patient did experience on the first postoperative day some agitation  and change in mental status which required the use of soft restraints  and other reorientation and intervention that were somewhat successful.  However, upon cessation of IV morphine, the patient responded quite  rapidly and her mental status returned to baseline quickly.  There was  also some concerns over the development of possible urinary tract  infection during her stay here.  However, the patient was placed on  prophylactic Avelox and once  all urine cultures showed negative results,  the Avelox was discontinued.   The patient has worked intermittently with physical therapy for  activities to encourage the patient to get out of bed, in addition to  range of motion exercises of her left knee.  Given the patient's  extensive injuries and exacerbation of pain with movement, physical  therapy sessions have been somewhat limited.  Given this, the patient  has required maximal assist, usually about 2+ to aid in any type of  movement whatsoever.  On postoperative day #5, the patient was deemed to  be stable enough with stable vital signs and stable labs which warrant  the patient's discharge to a skilled nursing facility.  The patient has  been seen and evaluated by inpatient rehab, but given her chronic  status, they feel that a skilled nursing facility is more appropriate  for this patient, given the extensive assistance needed to mobilize the  patient.   DISCHARGE PHYSICAL EXAMINATION:  VITAL SIGNS:  99.4.  Heart rate 86.  Respirations 20.  BP 109/68.  O2 sat 92% on room air.  GENERAL:  The patient is awake, alert, and in no acute distress.  She is  resting comfortably in bed and watching television.  LUNGS:  Clear to auscultation bilaterally.  CARDIAC:  Regular rate and rhythm.  LEFT LOWER EXTREMITY:  Splint and Ace wraps are intact on the left  ankle.  The foot appears to be in a neutral position.  Blood flow is  intact for the left knee range of motion.  There is also a decreased  swelling appreciated.  DPN, SPN, TN sensation is intact.  Flexion and  extension of the greater and lesser  Achilles was also intact.  Brisk  capillary refill is noted on left lower extremity with symmetric  temperature and color of the contralateral lower extremity.  There has  been some concerns over the development of a rash over the patient's  buttock.  Upon examination of the buttock, there is increased redness,  but no overt skin  breakdown.   PERTINENT LABS:  Diagnostic testing, most recent CBC, July 15, 2007,  white blood cells 12.7.  Hemoglobin 10.1.  Hematocrit 29.4.  Platelets  275.  Sodium 139, potassium 3.6, chloride 103, CO2 31, BUN of 8,  creatinine 0.74, glucose 104.  The patient's current INR is 1.8 and she  is almost in the therapeutic range for DVT prophylaxis for Coumadin  therapy.  All blood cultures that have been drawn on the patient have  been negative, as have all urine cultures and urinalysis.   DISCHARGE MEDICATIONS:  1. Vicodin 325, 1-2 tabs q.4-6 h. as needed for  pain.  2. Robaxin 500 mg 1-2 every 6 hours as needed.   The patient may resume home medications as follows:  1. Xanax 1 mg at bedtime.  2. Xanax 0.5 mg p.o. daily.  3. Zegerid 40 mg p.o. daily.  4. Cardizem 360 mg daily.  5. The patient will also be on Coumadin therapy for DVD prophylaxis      and dosing will be as directed based on INR.  Goal INR for this      patient is between 2-3.   DISCHARGE INSTRUCTIONS:  Ms. Shiffman will be non weightbearing on her  left lower extremity.  She is to remain in the posterior and stirrup  ankle splint for her right ankle fracture repair for at least the next 2  weeks until she follows up with [place] at the office.  She is also to  continue to maintain use of the Bledsoe hinged knee brace on her left  knee.  Despite her being non weightbearing on the left lower extremity,  she is permitted to engage in range of motion activities of her left  knee.  I would encourage to start with very gentle passive range of  motion of her left knee and eventually progressing to active assistive,  and active range of motion as the patient can tolerate.  For at least  the next 2 weeks, the patient should be passive range of motion only,  and again this should be very gentle.  It is important for the patient  to achieve full extension on her left knee to allow her to ambulate with  proper gait mechanics.  Knee  incision, used for the repair of her left  tibial plateau fracture should be assessed daily and may be left  uncovered if there is no drainage present.  At no time should any  ointment, lotions, or other caustic agents be placed on the incisions,  as these can be detrimental to the healing skin.  The patient has been  provided with a wound care sheet which explains proper wound care, which  she should take with her to the skilled nursing facility and give it to  the nurses once she arrived.  As noted above, Ms. Batley will be on  Coumadin for DVT prophylaxis for approximately the next  6 weeks or so.  Goal INR for Ms. Schnieders is between 2-3.  Ms. Banning should work with  physical therapy and occupational therapy on a regular basis to assist  with out of bed and assist with some ambulation given her weightbearing  restrictions on the left lower extremity.  Given the development of some  skin irritation over the patient's buttock area, I would recommend that  she not remain in a supine position for any extended period of time,  particularly lying in bed without moving for a long period of time.  The  patient should be placed on a decubitus prophylaxis protocol to ensure  that there is no further progression of the areas of irrigation.  The  patient will follow up with Dr. Carola Frost in 10-14 days for evaluation at  the office and possible removal of splint of her left ankle.  Depending  on what is seen on x-ray, the patient may be permitted to begin some  very gentle range of motion activities for left ankle range of motion.  However, she will remain non weightbearing for approximately the next 6-  8 weeks on her left lower extremity.  Should the patient or  the nursing  facility, or the patient's family have any questions at any point of  time, they should not hesitate to contact the office at [phone].      Mearl Latin, PA      Doralee Albino. Carola Frost, M.D.  Electronically Signed     KWP/MEDQ  D:  07/18/2007  T:  07/18/2007  Job:  [id number]

## 2010-10-06 NOTE — H&P (Signed)
NAMEJOELIE, SCHOU               ACCOUNT NO.:  [id number]   MEDICAL RECORD NO.:  [id number]          PATIENT TYPE:  AMB   LOCATION:  DAY                           FACILITY:  [facility]   PHYSICIAN:  Dalia Heading, M.D.  DATE OF BIRTH:  1934-10-04   DATE OF ADMISSION:  DATE OF DISCHARGE:  LH                              HISTORY & PHYSICAL   CHIEF COMPLAINT:  Dysphagia.   HISTORY OF PRESENT ILLNESS:  The patient is a 76 year old white female  who was referred for endoscopic evaluation.  She needs an EGD for  dysphagia.  She is having swallowing difficulties.  She has had her  esophagus stretched in the past due to neck surgery.  No abdominal pain,  weight loss, nausea or vomiting are noted.   Past medical history includes extrinsic allergies, chronic back pain,  hypertension.   PAST SURGICAL HISTORY:  Back surgery, cholecystectomy, neck surgery.   CURRENT MEDICATIONS:  Zegerid, Cardizem, hydrocodone for pain,  alprazolam, Singulair.   ALLERGIES:  PENICILLIN and CODEINE.   REVIEW OF SYSTEMS:  The patient denies drinking or smoking.  She denies  any recent chest pain, MI, CVA, or diabetes mellitus.   On physical examination, the patient is a well-developed, well-nourished  white female in no acute distress.  Neck is supple without lymphadenopathy.  Lungs are clear to auscultation with equal breath sounds bilaterally.  Heart examination reveals a regular rate and rhythm without S3, S4, or  murmurs.   IMPRESSION:  Dysphagia.   PLAN:  The patient is scheduled for an EGD with possible dilatation on  December 20, 2006.  The risks and benefits of the procedure including  bleeding and perforation were fully explained to the patient, who gave  informed consent.      Dalia Heading, M.D.  Electronically Signed     MAJ/MEDQ  D:  12/01/2006  T:  12/02/2006  Job:  [id number]   cc:   [facility]  Fax: [phone]   Milus Mallick. Lodema Hong, M.D.  Fax: [phone]

## 2010-10-06 NOTE — Discharge Summary (Signed)
NAMEKIMAYA, [name]               ACCOUNT NO.:  [id number]   MEDICAL RECORD NO.:  [id number]          PATIENT TYPE:  INP   LOCATION:  A310                          FACILITY:  [facility]   PHYSICIAN:  Marcello Moores, MD   DATE OF BIRTH:  02/07/1935   DATE OF ADMISSION:  09/22/2006  DATE OF DISCHARGE:  05/03/2008LH                               DISCHARGE SUMMARY   PAST MEDICAL HISTORY:  Dr. Syliva Overman.   ADMISSION AND HOSPITAL COURSE:  Ms. [name] is a 76 year old pleasant  female patient who presents with worsening of wheezing and shortness of  breath without fever,  without chest pain, without cough.  She is a  known patient of COPD, and once she was also on prednisolone which was  tapped off, and she was not taking it currently at least for a few  weeks.  But, she denied any history of intubation because of COPD or  asthma.  The patient was admitted with assessment of COPD exacerbation  and was put on Solu-Medrol and Levaquin.  The patient improved  continuously.  Today, she is without any respiratory distress.  She has  no wheezes.  She is ready to go home today.   PHYSICAL EXAMINATION:  VITAL SIGNS:  Blood pressure 143/79, temperature  is 96.7, pulse 91, respiratory rate 20, saturation is 95%.  HEENT:  She has pink conjunctivae.  Nonicteric sclera.  CHEST:  She has good air entry bilateral.  Scattered rhonchi.  No  wheezes.  ABDOMEN:  Soft, obese, normoactive bowel sounds.  EXTREMITIES:  She has no pedal edema.  CNS:  She is alert and oriented.   LABORATORY:  White blood cells 12.7, hemoglobin is 12, hematocrit 36,  platelets 365.  On the chemistries, sodium is 143, potassium is 4.7,  chloride is 104, bicarb is 30, glucose is 143.  Chest x-ray is showing  mild COPD, chronic bronchitis.   ASSESSMENT:  1. Chronic obstructive pulmonary disease exacerbation.  2. Gastroesophageal reflux disease.  3. Obesity.  4. Anxiety and depression.   HOME MEDICATIONS:  She was  discharged with:  1. Levaquin 500 mg p.o. daily for 5 days.  2. Advair Diskus b.i.d.  3. Singulair 10 mg p.o. daily.  4. Cardizem 350 mg p.o. daily  5. Xanax 0.25 mg p.o. p.r.n.  6. Albuterol nebulizer 2 times per day.  7. Albuterol inhaler p.r.n.  8. Protonix 40 mg p.o. daily  9. Hydrocodone 5/500 mg t.i.d. for low back pain.   The patient is Dr. Anthony Sar patient and she has regular followup with  her.  The patient is very persistent and I of course advised her to have  followup after a few days with Dr. Lodema Hong and to discuss to consider  prednisone.      Marcello Moores, MD  Electronically Signed     MT/MEDQ  D:  09/24/2006  T:  09/24/2006  Job:  [id number]

## 2010-10-08 ENCOUNTER — Other Ambulatory Visit: Payer: Self-pay

## 2010-10-08 MED ORDER — FUROSEMIDE 20 MG PO TABS: 20.0000 mg | ORAL_TABLET | Freq: Every day | ORAL | Status: DC

## 2010-10-08 MED ORDER — POTASSIUM CHLORIDE CRYS ER 20 MEQ PO TBCR: 20.0000 meq | EXTENDED_RELEASE_TABLET | Freq: Every day | ORAL | Status: DC

## 2010-10-09 NOTE — Discharge Summary (Signed)
[facility]  Patient:    [name], HARING Visit Number: [id number] MRN: [id number]          Service Type: SUR Location: 4W [id number] [id number] Attending Physician:  Marlowe Kays Page Dictated by:   Druscilla Brownie. Underwood III, P.A.-C. Admit Date:  11/06/2001 Discharge Date: 11/11/2001                             Discharge Summary  ADMITTING DIAGNOSES: 1. Lateral recess stenosis L3-L4, L4-L5 on the right. 2. Hypertension. 3. Gastroesophageal reflux disease. 4. Hemorrhoids. 5. Asthma. 6. History of anxiety/depression.  DISCHARGE DIAGNOSES: 1. Lateral recess stenosis L3-L4, L4-L5, L5-S1 on the right. 2. Hypertension. 3. Gastroesophageal reflux disease. 4. Hemorrhoids. 5. Asthma. 6. History of anxiety/depression.  OPERATION:  On November 06, 2001, the patient underwent lateral recess decompression laminectomy to L3-L4, L4-L5, and L5-S1 on the right. Dr. Noel Gerold assisted.  CONSULTATIONS:  None.  HISTORY OF PRESENT ILLNESS:  The patient is a 76 year old white female seen by Dr. Simonne Come for continuing problems concerning her low back.  She had had previous decompressions done at [facility] from L2-S1 in 1996.  She developed decreased reflexes as well as strength on the right side.  She had a scoliosis in the lumbar spine.  She had significant pain and discomfort unrelieved by conservative measures.  It is felt this patient would benefit from surgical intervention and was admitted for the above procedure.  COURSE IN THE HOSPITAL:  The patient tolerated the surgical procedure quite well, she did have some residual right hip pain.  This resolved over the next 24 hours.  We were able to wean her from the PCA pump.  We wanted to get her off of the PCA pump due to her continuing nausea and vomiting.  We treated her with antiemetics and switched her to p.o. medications.  She did have some tape "blisters" and we used A and D cream.  We switched her from the  codeine derivatives to [facility] for her discomfort and she did well with that. She had positive flatus.  On the day of discharge the patient was ambulating in her room, her nausea and vomiting had resolved and her leg pain was much better.  Neurovascular was intact in the lower extremities and her vital signs were stable.  She was discharged home to her family.  LABORATORY VALUES:  Laboratory values in the hospital hematologically showed a CBC with differential completely within normal limits, blood chemistries also were normal.  Urinalysis negative for urinary tract infection and electrocardiogram showed normal sinus rhythm.  No chest x-ray seen in the chart.  CONDITION ON DISCHARGE:  Improved, stable.  PLAN:  The patient is discharged to her home in the care of her family.  She will use dry dressing p.r.n. to the back, may shower on postop day #5.  No bending and no lifting.  She is to resume her home medications and diet, Mepergan Fortis one to two q.4-6h. p.r.n. pain, Robaxin one p.o. q.6h. p.r.n. muscle spasm, Peri-Colace one b.i.d.  Call if any problems. Dictated by:   Druscilla Brownie. Underwood III, P.A.-C. Attending Physician:  Joaquin Courts DD:  11/29/01 TD:  12/01/01 Job: [id number]
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
## 2010-10-09 NOTE — H&P (Signed)
[name], [name]                          ACCOUNT NO.:  [id number]   MEDICAL RECORD NO.:  [id number]                   PATIENT TYPE:  INP   LOCATION:  A309                                 FACILITY:  [facility]   PHYSICIAN:  Hanley Hays. Dechurch, M.D.           DATE OF BIRTH:  13-Mar-1935   DATE OF ADMISSION:  07/04/2003  DATE OF DISCHARGE:                                HISTORY & PHYSICAL   This 76 year old Caucasian female followed by Dr. Syliva Overman with a  history of asthma, morbid obesity, gastroesophageal reflux disease and  chronic back pain secondary to multiple surgical procedures who presents to  the hospital with a 3-day history of COPD exacerbation.  She was seen by her  primary care physician on Tuesday; and, at that time, admission was advised  secondary to shortness of breath, cough, and fever.  She was treated with  Ketek, Depo-Medrol, and steroid Dosepak along with her nebulizers to no  avail.  Because of progressive shortness of breath, poor sleep, and cough,  she is being admitted to the hospital.   SOCIAL HISTORY:  The patient continues to smoke about 1 pack per day  although she states that she quit 2 days ago.  She has no history of alcohol  abuse.  She quit drinking about 3 years ago.  At that time she only drank  wine, but not to significant amounts.  She is married.  Her husband is in  good health.  She has 4 children alive and well and a supportive family.   FAMILY MEDICAL HISTORY:  Noncontributory though she does have a sister with  osteogenesis imperfecta.   MEDICATIONS:  1. Cardizem CD 300 daily.  2. Hydrocodone APAP 5/500 b.i.d.  3. Xanax 0.5 t.i.d.  4. Prevacid 30 daily.  5. She also has a prescription for __________  b.i.d.   ALLERGIES:  PENICILLIN which causes palpitations.  OXYCODONE which causes  hallucinations that she experienced during her postoperative period after  her back surgery and SULFA, I believe.  There is mention of Vicodin  intolerance, but that is incorrect, it is OXYCODONE intolerance; and again,  not a true allergic reaction.   PAST MEDICAL HISTORY:  1. Remarkable for asthma.  2. She is status post bilateral knee arthroscopy.  3. Status post hemorrhoidectomy.  4. Lateral recess stenosis L3-4, L4-5 revision.  5. Gastroesophageal reflux.  6. History of anxiety and depression.  7. History of previous lumbar spinal compression.  8. Osteoarthritis.  9. Obesity.   REVIEW OF SYSTEMS:  She is limited secondary to her back.  She sleeps on her  side secondary to her back. She has no evidence of sleep apnea according to  her.  She does not have oxygen at home.  She uses her nebulizers for 3-4  times per day.  No GU complaints.  She does complain of reflux, worse  recently, probably exacerbated by steroids.  No mental  status changes.  No  change in her exercise tolerance except over the last 2 days. No chest pain  or other findings.   PHYSICAL EXAMINATION:  GENERAL:  Physical exam reveals an obese female in no  distress, able to converse without shortness of breath.  She had a tight  raspy cough.  NECK:  Neck is supple.  No JVD.  HEENT:  Oropharynx is moist.  She has dentures.  LUNGS:  Diminished with bilateral inspiratory and expiratory wheezing with  decreased breath sounds. No rales are noted.  Respiratory rate is about 16  at rest.  HEART:  Regular rate and rhythm.  I cannot appreciate a murmur.  ABDOMEN:  The abdomen is obese, soft, nontender.  EXTREMITIES:  Without clubbing or cyanosis.  No edema.  SKIN:  She has varicosities, but no breakdown.  NEUROLOGIC:  Neurologic status is grossly intact.  She has a mild resting  tremor but she did just have an albuterol treatment.   ASSESSMENT AND PLAN:  1. Asthma exacerbation refractory to outpatient treatment.  The patient is     admitted for __________ Solu-Medrol, oxygen supplementation and further     evaluation and treatment as indicated.  We will  continue her usual     medications.  2. Tobacco abuse ongoing. The patient is interested in a trial of Nicoderm     which we will begin next.  3. Chronic pain.  Apparently she has been managed well with his Lortab twice     daily.  She states that she has been advised to try the pain patch, but     cannot afford them.  I am not sure that that would be significantly     effective, but may consider a trial.  4. Gastroesophageal reflux.  Will continue her on Protonix.  Discussed     reflux control.     ___________________________________________                                         Hanley Hays. Josefine Class, M.D.   FED/MEDQ  D:  07/04/2003  T:  07/04/2003  Job:  [id number]

## 2010-10-09 NOTE — H&P (Signed)
NAMECAIRO, [name]                          ACCOUNT NO.:  [id number]   MEDICAL RECORD NO.:  [id number]                   PATIENT TYPE:  AMB   LOCATION:  DAY                                  FACILITY:  [facility]   PHYSICIAN:  Jerolyn Shin C. Katrinka Blazing, M.D.                DATE OF BIRTH:  November 10, 1934   DATE OF ADMISSION:  DATE OF DISCHARGE:                                HISTORY & PHYSICAL   HISTORY OF PRESENT ILLNESS:  [age]-year-old female with history of  chronic pain and numbness of the right hand.  She had been having decreasing  grip strength.  She has had numbness and pain in her thumb, index finger and  little finger.  She has severe pain at night.  She has a positive Phalen's  test and a positive Tinel's sign.  Peripheral nerve conduction velocity  reveals decreased latency bilaterally, much more prominent on the right than  on the left.  She will have right carpal tunnel release with delayed  procedure on the left side.   PAST HISTORY:  She has hypertension, lumbar disk disease, asthma, chronic  obstructive lung disease, atypical chest pain.   MEDICATIONS:  1. Meridia 10 mg every day.  2. Nasacort AQ every day.  3. Cardizem 300 mg every day.  4. Hydrochlorothiazide 25 mg every day.  5. Ultracet two q.i.d.  6. Flexeril 5 mg t.i.d.  7. Xanax 0.5 mg b.i.d.  8. Os-Cal D 500 b.i.d.   PAST SURGICAL HISTORY:  Total abdominal hysterectomy, cholecystectomy,  cervical diskectomy x2, lumbar laminectomy.   ALLERGIES:  PENICILLIN and CODEINE.   PHYSICAL EXAMINATION:  VITAL SIGNS:  On examination, blood pressure 160/100,  pulse 92, respirations 24.  Weight 221 pounds.  HEENT:  Unremarkable.  NECK:  Neck is supple without JVD or bruit.  CHEST:  Chest is clear to auscultation.  HEART:  Regular rhythm without murmur, gallop or rub.  ABDOMEN:  Abdomen obese, soft and nontender.  BACK:  Diffuse lumbar tenderness, positive spasm.  EXTREMITIES:  On the right hand, she has positive Tinel's  sign, positive  Phalen's sign, atrophy of the hypothenar eminence on the right, decreased  sensation of the right hand in the radial distribution.  NEUROLOGIC:  Cranial nerves intact.  Gait and stance unremarkable.  Deep  tendon reflexes are intact.   IMPRESSION:  1. Bilateral carpal tunnel syndrome, much worse on the right than on the     left.  2. Severe lumbar disk disease.  3. Uncontrolled hypertension.  4. Obesity.  5. Chronic asthma.  6. Chronic obstructive lung disease.   PLAN:  Right carpal tunnel release.                                               Dirk Dress.  Katrinka Blazing, M.D.    LCS/MEDQ  D:  03/29/2002  T:  03/30/2002  Job:  [id number]

## 2010-10-09 NOTE — Discharge Summary (Signed)
[name], [name]                          ACCOUNT NO.:  [id number]   MEDICAL RECORD NO.:  [id number]                   PATIENT TYPE:  INP   LOCATION:  A309                                 FACILITY:  [facility]   PHYSICIAN:  Hanley Hays. Dechurch, M.D.           DATE OF BIRTH:  June 18, 1934   DATE OF ADMISSION:  07/04/2003  DATE OF DISCHARGE:  07/07/2003                                 DISCHARGE SUMMARY   DIAGNOSES:  1. Chronic obstructive pulmonary disease exacerbation.  2. Musculoskeletal headache, resolved.  3. Chronic back pain.  4. Morbid obesity.  5. Hypertension.  6. Anxiety.   DISPOSITION:  The patient is discharged to home.  Follow up with Dr.  Lodema Hong.   MEDICATIONS:  1. Cardizem 300 daily.  2. Lortab 5/500 b.i.d.  3. Xanax 0.5 t.i.d.  4. Prevacid 30 daily.  5. Steroid Dosepak 10 mg daily for 3 days, 5 mg daily for 3 days, then stop.     She is to follow up with Dr. Lodema Hong in 5-7 days.  6. The patient was also given a prescription for Duragesic patches 25 mcg     q.72h.   HOSPITAL COURSE:  This 76 year old Caucasian female is followed by Dr.  Syliva Overman with a history of asthma and ongoing tobacco abuse.  She  was seen in the office 2 days prior to admission with evidence of an  exacerbation.  She was given antibiotics and a steroid Dosepak, but because  of increasing cough and shortness of breath; subjective fevers and chills;  she was admitted to the hospital.  The patient did not appear to be in any  significant respiratory distress though she was wheezing. Her O2 saturations  on room air were in the low 90s.  Oxygen was placed.  She was started on IV  Solu-Medrol.  Chest x-ray revealed no evidence of infiltrate.  She received  IV Rocephin as well.  She tolerated the medications fine; and, actually over  the course of the next 48 yours, had good improvement. It should also be  noted that the patient did not go downstairs to smoke.  She was compliant  with  nicotine patch and admitted that she felt better.  The patient's usual  medications including Cardizem were continued.  Interestingly, I am now  looking at the medication record and she was given 360 of Cardizem instead  of 300 though her blood pressure was acceptable.  The patient complained of  severe headache that radiated to the neck to the occiput and anteriorly on  her head.  It was constant and caused nausea.  She was placed on Reglan,  Toradol regimen q.8h. and after 24 hours her pain was resolved.  In addition  she complained of chronic back pain.  She has been told that Duragesic would  be a good option for her, but she could not afford it.  We opted to try  Duragesic  here in the hospital to see if it would be efficacious.  She noted  improvement in her pain.  She was not using any p.r.n. medications.  She  remained on her b.i.d. Lortab.  The patient was given a prescription though  she doubted she could fill it due to the cost; and, unfortunately, she does  not qualify for the in hospital assistance program as she does have health  insurance.   The patient is also noted to be hyperglycemia during the hospital stay with  glucose of 156 and 174 respectively; however, this was on steroids.  This  can be monitored as an outpatient.  Hemoglobin A1c was 5.8.  The patient  complained of severe nose bleeds with oxygen.  The oxygen was  discontinued.  Her O2 saturations remained in the low 90s.  She appeared  quite comfortable.  She was given normal saline nose spray.  At the time of  discharge she is alert and pleasant.  Blood pressure is 130/70.  She has no  cough.  Her lungs are completely clear even on forced expiration.  She has  decreased breath sounds bilaterally, however.  No rales or rhonchi are  noted.  Heart is distant but regular.  The abdomen is obese.  Extremities  are without clubbing.  No edema is present.  Her neurologic exam is intact.  She is discharged home with  follow up as noted above.     ___________________________________________                                         Hanley Hays. Josefine Class, M.D.   FED/MEDQ  D:  07/07/2003  T:  07/07/2003  Job:  [id number]   cc:   Milus Mallick. Lodema Hong, M.D.  [address]  [place], [place] [address]  Fax: [phone]

## 2010-10-09 NOTE — Op Note (Signed)
[facility]  Patient:    [name], [name] Visit Number: [id number] MRN: [id number]          Service Type: SUR Location: 4W [id number] 01 Attending Physician:  Marlowe Kays Page Dictated by:   Illene Labrador. Aplington, M.D. Proc. Date: 11/06/01 Admit Date:  11/06/2001                             Operative Report  PREOPERATIVE DIAGNOSIS:  Lateral recess stenosis of L3-4, L4-5, and slightly of L5-S1 on right, post central decompressive laminectomy.  POSTOPERATIVE DIAGNOSIS:  Lateral recess stenosis of L3-4, L4-5, and slightly of L5-S1 on right, post central decompressive laminectomy.  OPERATION:  Lateral recess re-decompression, L3 to sacrum.  SURGEON:  Illene Labrador. Aplington, M.D.  ASSISTANT:  Patricia Nettle, M.D.  ANESTHESIA:  General.  PATHOLOGY AND JUSTIFICATION FOR PROCEDURE:  Dr. Angelyn Punt at [facility] performed the central and lateral recess decompression of L2 to the sacrum along with foraminotomies at every level in September of 1998.  He did have a dural tear at that time.  Her primary problem seemed to be at L3-4 and L4-5 on the right, creating right leg pain.  There is a disk protrusion of L3-4 on the right which on MRI is not a herniation, but a protrusion, but her nerve roots at these three levels did not fill well on the myelogram.  Consequently, she is here today for the lateral recess decompression.  Potential risks, particularly of dural tears were discussed with her in detail.  DESCRIPTION OF PROCEDURE:  Prophylactic antibiotics, satisfactory general anesthesia, and prone position on the Wilson frame, the back was prepped with DuraPrep, and draped in the sterile field; once through the old surgical incision and made the first of three x-rays, tentatively localizing the sacrum, and working superior.  She is an extremely heavy woman which made the operation even more difficult than it normally would be.  I worked laterally with almost a  Wiltse-type approach, and was able to finger dissect for a lot of the the case, moving her lateral lumbar muscles laterally, and initially placing three self-retaining McCullough retractors with the blade on the left and the subcutaneous tissue and a blade on the right down to bone.  I gradually mobilized bone working medially until I came to the previous bony margins of the dissection and then used a curet to cautiously reflect the fibrous tissue off the apex of this lateral bony trough.  We then are able to move the self-retaining retractor blades deeper on the left side which gave additional retraction and working first at L3-4, locating the pars.  I was able to use   2 mm and in some cases 3 mm Kerrison rongeurs, working along the lateral rim of the previous dissection down to the sacrum and then working up superiorly, well-above L3, to thoroughly decompress her to the best of our abilities laterally.  At L3-4, the foramen was identified.  By trying to put a hockey stick through it, we encountered a very minimal dural tear, and we also had a very minimal dural tear at L4-5.  This was all at the lateral recess and we felt that this could be well-handled with fibrin glue.  Along the course of the dissection, I used two additional x-rays to localize the levels from L3 to the sacrum.  When I had removed as much bone as I thought, I could without encountering more  difficulties with dural tears, we felt that this was the best that we could do in decompressing her.  Fibrous glue was mixed and inserted and covered with Gelfoam.  Self-retaining retractors were removed and there was no unusual bleeding.  The fascia was reapproximated with interrupted #1 Vicryl, the subcutaneous deep with the same, and 3-0 Vicryl superficial with staples in the skin.  Betadine Adaptic dry sterile dressings were applied.  She tolerated the procedure well and was taken to the recovery room in satisfactory  condition with the only complications being the small dural tears as noted above.  Estimated blood loss was 350 cc and no blood replaced. Dictated by:   Illene Labrador. Aplington, M.D. Attending Physician:  Joaquin Courts DD:  11/06/01 TD:  11/08/01 Job: [id number]

## 2010-10-09 NOTE — Op Note (Signed)
   NAMEAMERIAH, LINT                          ACCOUNT NO.:  [id number]   MEDICAL RECORD NO.:  [id number]                   PATIENT TYPE:  AMB   LOCATION:  DAY                                  FACILITY:  [facility]   PHYSICIAN:  Jerolyn Shin C. Katrinka Blazing, M.D.                DATE OF BIRTH:  1934/11/23   DATE OF PROCEDURE:  03/30/2002  DATE OF DISCHARGE:  03/30/2002                                 OPERATIVE REPORT   PREOPERATIVE DIAGNOSIS:  Right carpal tunnel compression syndrome.   POSTOPERATIVE DIAGNOSIS:  Right carpal tunnel compression syndrome.   PROCEDURE:  Right carpal tunnel release.   SURGEON:  Dirk Dress. Katrinka Blazing, M.D.   DESCRIPTION OF PROCEDURE:  Under Bier block standard a wrist and palmar  incision was made.  Incision was extended down to the transverse carpal  ligament.  A small incision was made in the transverse carpal ligament and a  groove probe was placed beneath the ligament extending into the palm.  Following the groove probe the transverse carpal ligament was incised  throughout its entire length into the midpalm.  Portions of the ligament was  excised at the midportion.  The incision was then extended more proximally  in the wrist until this was fully excised.  The neural structures did not  appear to be made significantly compressed and there was no significant  fibrosis of the neural structures.  The incision was closed with interrupted  3-0 Dexon followed by running 3-0 Prolene dressing with a splint was placed.  The patient tolerated the procedure well.  She was transferred to a  wheelchair and taken to Day Surgery for short-term monitoring.                                               Dirk Dress. Katrinka Blazing, M.D.    LCS/MEDQ  D:  05/13/2002  T:  05/14/2002  Job:  [id number]

## 2010-10-09 NOTE — H&P (Signed)
[facility]  Patient:    KHAI, TORBERT Visit Number: [id number] MRN: [id number]          Service Type: OUT Location: RAD Attending Physician:  Royden Purl Dictated by:   Dorie Rank, P.A.-C. Adm. Date:  11/06/01                           History and Physical  DATE OF BIRTH:  1934/07/14  CHIEF COMPLAINT:  Low back pain.  HISTORY OF PRESENT ILLNESS:  The patient is a pleasant 76 year old female with a long history of low back pain.  She underwent a decompression of the central lateral in 1996 at [facility] at L2 through S1.  Since that time, she has developed this pain that has been refractory to conservative treatment including steroid injections, nonsteroidals, pain medications, activity modifications.  She had x-rays in the office which revealed degenerative changes.  Scoliosis was noted to the right upper lumbar and left lower lumbar. She had a subluxation at L2 on L3 and extensive degenerative changes of her lumbar spine.  On physical exam in the office, it was noted that she ambulates with a cane.  She has no extension over the waist.  She flexioned to about fingertips to the knees.  She has right buttocks tenderness on palpation and numbness of the right outer leg and foot, as well as numbness to the left thigh.  Reflexes are 1/2+ on the left knee, right ankle are 1/2+ on the right, and zero on the left.  She has nonpitting edema to bilateral lower extremities.  It was felt that due to her ongoing pain, being refractory to conservative treatment, she would benefit from undergoing a lateral recess decompression of L3 through 4, and L4 through 5 on the right.  Risks and benefits of the above procedure were discussed with the patient, and she elected to proceed.  ALLERGIES:  PENICILLIN causes palpitations, VICODIN causes hallucinations, CODEINE causes muscle spasms.  CURRENT MEDICATIONS:  Xanax 0.5 mg one p.o. t.i.d. p.r.n., Prevacid 30  mg one p.o. q.o.d., Cardizem 30 mg one p.o. q.h.s.  PAST MEDICAL HISTORY:  Significant for anxiety and depression, stable currently, hypertension, GERD, hemorrhoids, and occasionally they do bleed. She does have a history of osteoarthritis as well as asthma.  Note, that her primary care physician is Dr. Judie Petit. [facility] in [place].  SOCIAL HISTORY:  The patient is married.  She is denies any alcohol or tobacco use.  She has a husband to care for her after her surgery.  She lives in a one level home.  FAMILY HISTORY:  Mother deceased at age 76, history of cervical cancer. Father deceased at age 54, history of hypertension and osteoarthritis.  PAST SURGICAL HISTORY:  Total abdominal hysterectomy, bilateral knee arthroscopies in mid 1970s, cholecystectomy in mid 1970s, abdominal hernia repair in 1986, and lumbar decompression as discussed above in 1988.  REVIEW OF SYSTEMS:  GENERAL:  No fever, chills, night sweats, or bleeding tendencies.  She did have a gastrointestinal virus about one month ago and has resolved.  PULMONARY:  Significant for a persistent cough secondary to asthma. No hemoptysis.  CARDIOVASCULAR:  No chest pain, angina, or orthopnea. ENDOCRINE:  No history of hypo or hyperthyroidism.  No history of diabetes mellitus.  GI:  Positive for constipation.  No diarrhea, melena, or hematemesis.  GU:  No hematuria, dysuria, or discharge.  NEUROLOGIC:  No seizures, headaches, or paralysis.  PHYSICAL EXAMINATION:  GENERAL:  Obese, alert and oriented x3.  VITAL SIGNS:  Pulse 100, respirations 20, blood pressure 150/90.  HEENT:  Head is atraumatic and normocephalic.  Oropharynx is clear.  NECK:  Supple and negative for cervical lymphadenopathy.  CHEST:  Clear to auscultation bilaterally.  No wheezing, rhonchi, or rales.  BREASTS:  Not pertinent to Present Illness.  HEART:  Distant S1 and S2.  Negative for murmur, rub, or gallop.  Heart regular rate and rhythm.  ABDOMEN:   Round and nontender with positive bowel sounds.  GENITOURINARY:  No pertinent to Present Illness.  Please see History of Present Illness for physical exam of lumbar spine and bilateral lower extremities.  SKIN:  Intact.  EXTREMITIES:  She does have 2+ nonpitting edema to the bilateral lower extremities.  LABORATORY DATA:  Preoperative labs and x-rays are pending.  IMPRESSION: 1. Lateral recess stenosis of L3 through 4, L4 through 5 to the right. 2. Hypertension. 3. Gastroesophageal reflux disease. 4. Hemorrhoids. 5. Asthma. 6. History of anxiety and depression.  PLAN:  The patient is scheduled for a lateral recess decompression of L3 through 4, and L4 through 5 with James P. Aplington, M.D. Dictated by:   Dorie Rank, P.A.-C. Attending Physician:  Royden Purl DD:  10/31/01 TD:  11/02/01 Job: [id number]

## 2010-10-10 ENCOUNTER — Emergency Department (HOSPITAL_COMMUNITY)
Admission: EM | Admit: 2010-10-10 | Discharge: 2010-10-11 | Disposition: A | Discharge status: Home / Self Care | Payer: Medicare Other | Attending: Emergency Medicine | Admitting: Emergency Medicine

## 2010-10-10 ENCOUNTER — Emergency Department (HOSPITAL_COMMUNITY): Payer: Medicare Other

## 2010-10-10 DIAGNOSIS — M549 Dorsalgia, unspecified: Secondary | ICD-10-CM | POA: Insufficient documentation

## 2010-10-10 DIAGNOSIS — Z9981 Dependence on supplemental oxygen: Secondary | ICD-10-CM | POA: Insufficient documentation

## 2010-10-10 DIAGNOSIS — Z79899 Other long term (current) drug therapy: Secondary | ICD-10-CM | POA: Insufficient documentation

## 2010-10-10 DIAGNOSIS — F0390 Unspecified dementia without behavioral disturbance: Secondary | ICD-10-CM | POA: Insufficient documentation

## 2010-10-10 DIAGNOSIS — I10 Essential (primary) hypertension: Secondary | ICD-10-CM | POA: Insufficient documentation

## 2010-10-10 DIAGNOSIS — J45901 Unspecified asthma with (acute) exacerbation: Secondary | ICD-10-CM | POA: Insufficient documentation

## 2010-10-10 DIAGNOSIS — G8929 Other chronic pain: Secondary | ICD-10-CM | POA: Insufficient documentation

## 2010-10-10 DIAGNOSIS — R0602 Shortness of breath: Secondary | ICD-10-CM | POA: Insufficient documentation

## 2010-10-10 DIAGNOSIS — E785 Hyperlipidemia, unspecified: Secondary | ICD-10-CM | POA: Insufficient documentation

## 2010-10-13 ENCOUNTER — Encounter: Payer: Self-pay | Admitting: Family Medicine

## 2010-10-13 ENCOUNTER — Ambulatory Visit (INDEPENDENT_AMBULATORY_CARE_PROVIDER_SITE_OTHER): Payer: Medicare Other | Admitting: Family Medicine

## 2010-10-13 DIAGNOSIS — J45909 Unspecified asthma, uncomplicated: Secondary | ICD-10-CM

## 2010-10-13 DIAGNOSIS — F0280 Dementia in other diseases classified elsewhere without behavioral disturbance: Secondary | ICD-10-CM

## 2010-10-13 DIAGNOSIS — F32A Depression, unspecified: Secondary | ICD-10-CM

## 2010-10-13 DIAGNOSIS — G309 Alzheimer's disease, unspecified: Secondary | ICD-10-CM

## 2010-10-13 DIAGNOSIS — F329 Major depressive disorder, single episode, unspecified: Secondary | ICD-10-CM

## 2010-10-13 DIAGNOSIS — M1990 Unspecified osteoarthritis, unspecified site: Secondary | ICD-10-CM

## 2010-10-13 DIAGNOSIS — I10 Essential (primary) hypertension: Secondary | ICD-10-CM

## 2010-10-13 DIAGNOSIS — F0390 Unspecified dementia without behavioral disturbance: Secondary | ICD-10-CM

## 2010-10-13 MED ORDER — BUDESONIDE-FORMOTEROL FUMARATE 80-4.5 MCG/ACT IN AERO: 2.0000 | INHALATION_SPRAY | Freq: Two times a day (BID) | RESPIRATORY_TRACT | Status: DC

## 2010-10-13 MED ORDER — CITALOPRAM HYDROBROMIDE 10 MG PO TABS: 10.0000 mg | ORAL_TABLET | Freq: Every day | ORAL | Status: DC

## 2010-10-13 MED ORDER — BENZONATATE 100 MG PO CAPS: 100.0000 mg | ORAL_CAPSULE | Freq: Three times a day (TID) | ORAL | Status: DC | PRN

## 2010-10-13 NOTE — Progress Notes (Signed)
  Subjective:    Patient ID: [name], female    DOB: 29-Jan-1935, 76 y.o.   MRN: [id number]  HPI Pt in for re-eval from the ed for wheezing, states this improved. She has been falling up to last night.hurt her left hand which is bruised and swollen, has full mobility still.  Last week she rolled out of bed and was seen in the ed. She is in agreement to getting into a nursing home,preferably [facility] but wants her husband's estate settled. Her daughter is here but states she is unable to care for her. Pt is understandably experiencing bouts of depression with crying spells due to the recent loss of her husband unexpectedly, on whom she depended.     Review of Systems Se HPI Denies recent fever or chills. Denies sinus pressure, nasal congestion, ear pain or sore throat.  Denies chest pains, palpitations, paroxysmal nocturnal dyspnea or  orthopnea , chronic c/o leg swelling Denies abdominal pain, nausea, vomiting,diarrhea or constipation.  Denies rectal bleeding or change in bowel movement. Denies dysuria, frequency, hesitancy has  incontinence. Chronic  joint pain, swelling and limitation in mobility. Denies headaches, seizure, numbness, or tingling. Increased depression, anxiety andinsomnia.Not suicidal or homicidal, no hallucinations Denies skin break down , chronic redness of the legs        Objective:   Physical Exam Patient alert and oriented and in no Cardiopulmonary distress.  HEENT: No facial asymmetry, EOMI, no sinus tenderness, .  Neck supple no adenopathy.  Chest: decreased air entry, scattered wheezes bilaterally, no crakles  CVS: S1, S2 no murmurs, no S3.  ABD: Soft non tender. Bowel sounds normal.  Ext: No edema  MS: decreased  ROM spine, shoulders, hips and knees.  Skin: Intact, no ulcerations or rash noted.Erythema of ant legs, chronic  Psych: Good eye contact, tearful. Memory loss, both anxious and  depressed appearing.  CNS: CN 2-12  intact, power, tone and sensation normal throughout.        Assessment & Plan:

## 2010-10-13 NOTE — Patient Instructions (Signed)
F/u in 6 weeks  Pls be careful not to fall repeatedly.   I am really sorry about the loss of your husband.  You absolutely need to go into a nursing home,

## 2010-10-19 ENCOUNTER — Encounter: Payer: Self-pay | Admitting: Family Medicine

## 2010-10-19 NOTE — Assessment & Plan Note (Signed)
Deteriorated with recent flare, now improving

## 2010-10-19 NOTE — Assessment & Plan Note (Signed)
Deteriorated, recent multiple falls, home safety discussed with her daughter who is present

## 2010-10-19 NOTE — Assessment & Plan Note (Signed)
Controlled, no change in medication  

## 2010-10-27 ENCOUNTER — Emergency Department (HOSPITAL_COMMUNITY): Payer: Medicare Other

## 2010-10-27 ENCOUNTER — Inpatient Hospital Stay (HOSPITAL_COMMUNITY)
Admission: EM | Admit: 2010-10-27 | Discharge: 2010-11-05 | DRG: 690 | Disposition: A | Discharge status: Skilled Nursing Facility | Payer: Medicare Other | Attending: Internal Medicine | Admitting: Internal Medicine

## 2010-10-27 ENCOUNTER — Encounter (HOSPITAL_COMMUNITY): Payer: Self-pay | Admitting: Radiology

## 2010-10-27 DIAGNOSIS — J449 Chronic obstructive pulmonary disease, unspecified: Secondary | ICD-10-CM | POA: Diagnosis present

## 2010-10-27 DIAGNOSIS — F0151 Vascular dementia with behavioral disturbance: Secondary | ICD-10-CM | POA: Diagnosis present

## 2010-10-27 DIAGNOSIS — J4489 Other specified chronic obstructive pulmonary disease: Secondary | ICD-10-CM | POA: Diagnosis present

## 2010-10-27 DIAGNOSIS — K219 Gastro-esophageal reflux disease without esophagitis: Secondary | ICD-10-CM | POA: Diagnosis present

## 2010-10-27 DIAGNOSIS — I672 Cerebral atherosclerosis: Secondary | ICD-10-CM | POA: Diagnosis present

## 2010-10-27 DIAGNOSIS — F0152 Vascular dementia, unspecified severity, with psychotic disturbance: Secondary | ICD-10-CM | POA: Diagnosis present

## 2010-10-27 DIAGNOSIS — M549 Dorsalgia, unspecified: Secondary | ICD-10-CM | POA: Diagnosis present

## 2010-10-27 DIAGNOSIS — A498 Other bacterial infections of unspecified site: Secondary | ICD-10-CM | POA: Diagnosis present

## 2010-10-27 DIAGNOSIS — I872 Venous insufficiency (chronic) (peripheral): Secondary | ICD-10-CM | POA: Diagnosis present

## 2010-10-27 DIAGNOSIS — N390 Urinary tract infection, site not specified: Principal | ICD-10-CM | POA: Diagnosis present

## 2010-10-27 DIAGNOSIS — M7989 Other specified soft tissue disorders: Secondary | ICD-10-CM

## 2010-10-27 DIAGNOSIS — L02419 Cutaneous abscess of limb, unspecified: Secondary | ICD-10-CM | POA: Diagnosis present

## 2010-10-27 DIAGNOSIS — G8929 Other chronic pain: Secondary | ICD-10-CM | POA: Diagnosis present

## 2010-10-27 DIAGNOSIS — I10 Essential (primary) hypertension: Secondary | ICD-10-CM | POA: Diagnosis present

## 2010-10-27 HISTORY — DX: Chronic obstructive pulmonary disease, unspecified: J44.9

## 2010-10-27 LAB — CBC
Hemoglobin: 10.8 g/dL — ABNORMAL LOW (ref 12.0–15.0)
MCH: 29.9 pg (ref 26.0–34.0)
MCV: 98.1 fL (ref 78.0–100.0)
Platelets: 242 10*3/uL (ref 150–400)
RBC: 3.61 MIL/uL — ABNORMAL LOW (ref 3.87–5.11)
WBC: 8.8 10*3/uL (ref 4.0–10.5)

## 2010-10-27 LAB — DIFFERENTIAL
Basophils Absolute: 0 10*3/uL (ref 0.0–0.1)
Eosinophils Relative: 2 % (ref 0–5)
Lymphocytes Relative: 28 % (ref 12–46)
Lymphs Abs: 2.5 10*3/uL (ref 0.7–4.0)
Neutro Abs: 5.4 10*3/uL (ref 1.7–7.7)
Neutrophils Relative %: 62 % (ref 43–77)

## 2010-10-27 LAB — BASIC METABOLIC PANEL
BUN: 10 mg/dL (ref 6–23)
CO2: 37 mEq/L — ABNORMAL HIGH (ref 19–32)
Chloride: 100 mEq/L (ref 96–112)
Creatinine, Ser: 0.52 mg/dL (ref 0.4–1.2)
Potassium: 3.4 mEq/L — ABNORMAL LOW (ref 3.5–5.1)

## 2010-10-27 LAB — URINALYSIS, ROUTINE W REFLEX MICROSCOPIC
Glucose, UA: NEGATIVE mg/dL
Hgb urine dipstick: NEGATIVE
Ketones, ur: NEGATIVE mg/dL
pH: 7.5 (ref 5.0–8.0)

## 2010-10-27 LAB — HEPATIC FUNCTION PANEL
Albumin: 3.5 g/dL (ref 3.5–5.2)
Indirect Bilirubin: 0.2 mg/dL — ABNORMAL LOW (ref 0.3–0.9)
Total Protein: 7.2 g/dL (ref 6.0–8.3)

## 2010-10-27 LAB — URINE MICROSCOPIC-ADD ON

## 2010-10-28 LAB — CBC
HCT: 34.4 % — ABNORMAL LOW (ref 36.0–46.0)
Hemoglobin: 10.5 g/dL — ABNORMAL LOW (ref 12.0–15.0)
MCV: 97.5 fL (ref 78.0–100.0)
RBC: 3.53 MIL/uL — ABNORMAL LOW (ref 3.87–5.11)
WBC: 6.8 10*3/uL (ref 4.0–10.5)

## 2010-10-28 LAB — DIFFERENTIAL
Lymphocytes Relative: 36 % (ref 12–46)
Lymphs Abs: 2.4 10*3/uL (ref 0.7–4.0)
Monocytes Relative: 8 % (ref 3–12)
Neutrophils Relative %: 50 % (ref 43–77)

## 2010-10-28 LAB — BASIC METABOLIC PANEL
BUN: 6 mg/dL (ref 6–23)
CO2: 33 mEq/L — ABNORMAL HIGH (ref 19–32)
Chloride: 104 mEq/L (ref 96–112)
Glucose, Bld: 93 mg/dL (ref 70–99)
Potassium: 3.5 mEq/L (ref 3.5–5.1)

## 2010-10-28 LAB — MAGNESIUM: Magnesium: 2.1 mg/dL (ref 1.5–2.5)

## 2010-10-29 ENCOUNTER — Inpatient Hospital Stay (HOSPITAL_COMMUNITY): Payer: Medicare Other

## 2010-10-29 LAB — BASIC METABOLIC PANEL
CO2: 36 mEq/L — ABNORMAL HIGH (ref 19–32)
Chloride: 102 mEq/L (ref 96–112)
Chloride: 100 mEq/L (ref 96–112)
Creatinine, Ser: 0.55 mg/dL (ref 0.4–1.2)
GFR calc Af Amer: >60 mL/min (ref >60)
GFR calc Af Amer: >60 mL/min (ref >60)
Potassium: 3.3 mEq/L — ABNORMAL LOW (ref 3.5–5.1)
Sodium: 143 mEq/L (ref 135–145)

## 2010-10-29 LAB — DIFFERENTIAL
Basophils Relative: 0 % (ref 0–1)
Lymphocytes Relative: 32 % (ref 12–46)
Monocytes Relative: 9 % (ref 3–12)
Neutro Abs: 4.5 10*3/uL (ref 1.7–7.7)
Neutrophils Relative %: 55 % (ref 43–77)

## 2010-10-29 LAB — CBC
Hemoglobin: 10.5 g/dL — ABNORMAL LOW (ref 12.0–15.0)
MCH: 29.9 pg (ref 26.0–34.0)
RBC: 3.51 MIL/uL — ABNORMAL LOW (ref 3.87–5.11)

## 2010-10-29 LAB — MAGNESIUM: Magnesium: 2 mg/dL (ref 1.5–2.5)

## 2010-10-29 LAB — PRO B NATRIURETIC PEPTIDE: Pro B Natriuretic peptide (BNP): 257.6 pg/mL — ABNORMAL HIGH (ref 0–125)

## 2010-10-30 LAB — PRO B NATRIURETIC PEPTIDE: Pro B Natriuretic peptide (BNP): 209.3 pg/mL — ABNORMAL HIGH (ref 0–125)

## 2010-10-30 LAB — BASIC METABOLIC PANEL
Chloride: 103 mEq/L (ref 96–112)
Chloride: 105 mEq/L (ref 96–112)
GFR calc Af Amer: >60 mL/min (ref >60)
GFR calc Af Amer: >60 mL/min (ref >60)
Potassium: 3.4 mEq/L — ABNORMAL LOW (ref 3.5–5.1)
Potassium: 3.7 mEq/L (ref 3.5–5.1)
Sodium: 142 mEq/L (ref 135–145)

## 2010-10-30 LAB — MAGNESIUM: Magnesium: 2 mg/dL (ref 1.5–2.5)

## 2010-10-30 LAB — VANCOMYCIN, TROUGH: Vancomycin Tr: 16.2 ug/mL (ref 10.0–20.0)

## 2010-10-31 LAB — BASIC METABOLIC PANEL
CO2: 34 mEq/L — ABNORMAL HIGH (ref 19–32)
Calcium: 9.4 mg/dL (ref 8.4–10.5)
Creatinine, Ser: 0.68 mg/dL (ref 0.4–1.2)
GFR calc Af Amer: >60 mL/min (ref >60)
GFR calc non Af Amer: >60 mL/min (ref >60)
Sodium: 142 mEq/L (ref 135–145)

## 2010-10-31 LAB — CBC
MCH: 30.7 pg (ref 26.0–34.0)
Platelets: 243 10*3/uL (ref 150–400)
RBC: 3.48 MIL/uL — ABNORMAL LOW (ref 3.87–5.11)
RDW: 14.2 % (ref 11.5–15.5)

## 2010-10-31 LAB — DIFFERENTIAL
Basophils Relative: 0 % (ref 0–1)
Eosinophils Absolute: 0.1 10*3/uL (ref 0.0–0.7)
Eosinophils Relative: 1 % (ref 0–5)
Monocytes Relative: 10 % (ref 3–12)
Neutrophils Relative %: 56 % (ref 43–77)

## 2010-11-01 LAB — BASIC METABOLIC PANEL
Calcium: 9.8 mg/dL (ref 8.4–10.5)
GFR calc Af Amer: >60 mL/min (ref >60)
GFR calc non Af Amer: >60 mL/min (ref >60)
Potassium: 3.7 mEq/L (ref 3.5–5.1)
Sodium: 141 mEq/L (ref 135–145)

## 2010-11-02 LAB — BASIC METABOLIC PANEL
Chloride: 101 mEq/L (ref 96–112)
GFR calc Af Amer: >60 mL/min (ref >60)
GFR calc non Af Amer: >60 mL/min (ref >60)
Potassium: 4 mEq/L (ref 3.5–5.1)
Sodium: 140 mEq/L (ref 135–145)
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
## 2010-11-03 DIAGNOSIS — F4320 Adjustment disorder, unspecified: Secondary | ICD-10-CM
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
## 2010-11-04 LAB — BASIC METABOLIC PANEL
CO2: 30 mEq/L (ref 19–32)
Chloride: 95 mEq/L — ABNORMAL LOW (ref 96–112)
Creatinine, Ser: 0.67 mg/dL (ref 0.4–1.2)
GFR calc Af Amer: >60 mL/min (ref >60)
Sodium: 133 mEq/L — ABNORMAL LOW (ref 135–145)

## 2010-11-04 NOTE — Group Therapy Note (Signed)
  [name], [name]               ACCOUNT NO.:  [id number]  MEDICAL RECORD NO.:  [id number]  LOCATION:  A327                          FACILITY:  [facility]  PHYSICIAN:  Wilson Singer, M.D.DATE OF BIRTH:  Dec 28, 1934  DATE OF PROCEDURE:  11/04/2010 DATE OF DISCHARGE:                                PROGRESS NOTE   HISTORY OF PRESENT ILLNESS:  This lady whose medical problems seem to have stabilized, has become rather weak in the hospital and she was assessed by Physical Therapy today who felt that she had become very deconditioned and would require rehabilitation skilled nursing facility upon discharge.  From medical point of view, she appears to be fairly stable now.  All her issues including lower extremity cellulitis, edema seem to have resolved.  She is left with having some delusions, although they seem to be also stable.  PHYSICAL EXAMINATION:  Today, temperature 98.3, blood pressure 133/77, pulse 92, saturation 90% on 2 L of oxygen.  HEART:  Sounds are present and normal.  Lung fields are entirely clear.  ABDOMEN:  Soft, nontender. Examination of her leg shows very minimal swelling and just slight redness in both her legs, which I think is probably chronic.  INVESTIGATIONS:  Sodium 133, potassium 3.9, bicarbonate 30, BUN 12, creatinine 0.67.  IMPRESSION: 1. Recurrent urinary tract infection, although cultures on this     episode did not show growth. 2. Bilateral lower extremity cellulitis, improved. 3. Bilateral leg edema, improved.  This is secondary to venous     insufficiency 4. Dementia with visual hallucinations and delusions possibly. 5. Chronic obstructive pulmonary disease, stable. 6. Morbid obesity. 7. Chronic back pain from arthritis. 8. Hypertension.  PLAN: 1. Continue with current medications, probable discharge to skilled     nursing facility tomorrow.     Wilson Singer, M.D.    NCG/MEDQ  D:  11/04/2010  T:  11/04/2010  Job:   [id number]  Electronically Signed by Lilly Cove M.D. on 11/04/2010 05:04:50 PM

## 2010-11-05 LAB — DIFFERENTIAL
Basophils Absolute: 0 K/uL (ref 0.0–0.1)
Basophils Relative: 0 % (ref 0–1)
Eosinophils Absolute: 0.3 K/uL (ref 0.0–0.7)
Eosinophils Relative: 4 % (ref 0–5)
Lymphocytes Relative: 24 % (ref 12–46)
Lymphs Abs: 2 K/uL (ref 0.7–4.0)
Monocytes Absolute: 1 K/uL (ref 0.1–1.0)
Monocytes Relative: 11 % (ref 3–12)
Neutro Abs: 5.2 K/uL (ref 1.7–7.7)
Neutrophils Relative %: 61 % (ref 43–77)

## 2010-11-05 LAB — CBC
Hemoglobin: 11.4 g/dL — ABNORMAL LOW (ref 12.0–15.0)
MCH: 29.8 pg (ref 26.0–34.0)
MCV: 93.5 fL (ref 78.0–100.0)
Platelets: 327 10*3/uL (ref 150–400)
RBC: 3.83 MIL/uL — ABNORMAL LOW (ref 3.87–5.11)
WBC: 8.5 10*3/uL (ref 4.0–10.5)

## 2010-11-05 LAB — COMPREHENSIVE METABOLIC PANEL
ALT: 18 U/L (ref 0–35)
AST: 29 U/L (ref 0–37)
CO2: 31 mEq/L (ref 19–32)
Chloride: 97 mEq/L (ref 96–112)
GFR calc Af Amer: >60 mL/min (ref >60)
GFR calc non Af Amer: >60 mL/min (ref >60)
Glucose, Bld: 96 mg/dL (ref 70–99)
Sodium: 135 mEq/L (ref 135–145)
Total Bilirubin: 0.3 mg/dL (ref 0.3–1.2)

## 2010-11-08 NOTE — Discharge Summary (Signed)
[name], VANDERWEELE               ACCOUNT NO.:  [id number]  MEDICAL RECORD NO.:  [id number]  LOCATION:  A327                          FACILITY:  [facility]  PHYSICIAN:  Wilson Singer, M.D.DATE OF BIRTH:  21-Sep-1934  DATE OF ADMISSION:  10/27/2010 DATE OF DISCHARGE:  06/14/2012LH                         DISCHARGE SUMMARY-REFERRING   PRIMARY CARE PHYSICIAN:  Milus Mallick. Lodema Hong, MD  FINAL DISCHARGE DIAGNOSES: 1. Recurrent urinary tract infection. 2. Bilateral lower extremity cellulitis. 3. Bilateral lower extremity edema secondary to venous insufficiency. 4. Dementia. 5. Delusions with possible underlying schizoaffective disorder. 6. Asthma/chronic obstructive pulmonary disease. 7. Chronic back pain/osteoarthritis. 8. Morbid obesity. 9. Hypertension. 10.Gastroesophageal reflux disease.  CONDITION ON DISCHARGE:  Stable.  MEDICATIONS ON DISCHARGE: 1. Alprazolam 0.5 mg daily. 2. Alprazolam 1 mg nightly. 3. Diltiazem CD 360 mg daily. 4. Citalopram 10 mg daily. 5. Famotidine 20 mg b.i.d. 6. Symbicort 80/4.5 two puffs b.i.d. 7. Protonix 40 mg daily. 8. Exelon patch 4.6 mg for 24-hour 1 daily. 9. Seroquel 12.5 mg nightly. 10.Potassium chloride 40 mEq daily. 11.Furosemide 20 mg daily. 12.Vicodin 10/325 1 to 2 tablets every 4 hours p.r.n.  HISTORY:  This 76 year old lady was admitted with symptoms of progressive weakness.  Please see initial history and physical examination done by Dr. Vedia Coffer.  HOSPITAL PROGRESS:  The patient was admitted and initially treated for UTI.  The patient was commenced on Macrodantin since she had multiple allergies including PENICILLIN and QUINOLONES.  Subsequent urine culture showed no growth.  As far as her lower extremity cellulitis was concerned, this improved with intravenous vancomycin and had been resolved in a few days and therefore she was converted back to Bactrim orally which has now been discontinued as she has finished her  course. Her bilateral lower leg extremity edema is due to venous insufficiency and not really heart failure and it has improved significantly.  The patient during hospitalization seemed to have many delusions. Psychiatry was consulted, but unfortunately we do not have a full consultation at this present time.  She will clearly need to have this done as I suspect that she may have a schizoaffective disorder in addition to possible dementia.  Certainly Seroquel which has been started as low dose seems to have helped her.  Otherwise, all her other medical condition seems to be stabilized now.  On physical examination today, she looks well.  Temperature 97.4, blood pressure 102/71, pulse 90, saturation 93% on 2 L oxygen.  Heart sounds are present and normal. Lung fields are clear.  Abdomen is soft and nontender.  Her cellulitis is clearly improving.  Investigations today; hemoglobin 11.4, white blood cell count 8.5, platelets 327, sodium 135, potassium 3.6, bicarbonate 31, BUN 14, creatinine 0.77, albumin 2.8.  Note that ultrasound venous Dopplers on both legs were negative for DVT.  DISPOSITION:  The patient is stable to be discharged.  Physical therapy has evaluated her and feels that she is too weak and needs rehabilitation.  Therefore, she is going to a skilled nursing facility. Also I would highly recommend that she have face-to-face contact with the psychiatrist because I am suspicious of a schizoaffective disorder in addition to possible dementia.     Nimish  Normajean Glasgow, M.D.     NCG/MEDQ  D:  11/05/2010  T:  11/05/2010  Job:  [id number]  cc:   Milus Mallick. Lodema Hong, M.D. Fax: [phone]  Electronically Signed by Lilly Cove M.D. on 11/08/2010 07:10:17 AM

## 2010-12-02 ENCOUNTER — Encounter: Payer: Self-pay | Admitting: Family Medicine

## 2010-12-09 ENCOUNTER — Ambulatory Visit: Payer: Medicare Other | Admitting: Family Medicine

## 2011-01-22 ENCOUNTER — Encounter: Payer: Self-pay | Admitting: Family Medicine

## 2011-01-26 ENCOUNTER — Ambulatory Visit (INDEPENDENT_AMBULATORY_CARE_PROVIDER_SITE_OTHER): Payer: Medicare Other | Admitting: Family Medicine

## 2011-01-26 ENCOUNTER — Encounter: Payer: Self-pay | Admitting: Family Medicine

## 2011-01-26 VITALS — BP 140/82 | HR 83 | Resp 16 | Ht 63.0 in | Wt 217.4 lb

## 2011-01-26 DIAGNOSIS — N3000 Acute cystitis without hematuria: Secondary | ICD-10-CM

## 2011-01-26 DIAGNOSIS — F329 Major depressive disorder, single episode, unspecified: Secondary | ICD-10-CM

## 2011-01-26 DIAGNOSIS — F411 Generalized anxiety disorder: Secondary | ICD-10-CM

## 2011-01-26 DIAGNOSIS — J45909 Unspecified asthma, uncomplicated: Secondary | ICD-10-CM

## 2011-01-26 DIAGNOSIS — F0280 Dementia in other diseases classified elsewhere without behavioral disturbance: Secondary | ICD-10-CM

## 2011-01-26 DIAGNOSIS — Z23 Encounter for immunization: Secondary | ICD-10-CM

## 2011-01-26 DIAGNOSIS — I10 Essential (primary) hypertension: Secondary | ICD-10-CM

## 2011-01-26 DIAGNOSIS — K219 Gastro-esophageal reflux disease without esophagitis: Secondary | ICD-10-CM

## 2011-01-26 DIAGNOSIS — M549 Dorsalgia, unspecified: Secondary | ICD-10-CM

## 2011-01-26 DIAGNOSIS — N390 Urinary tract infection, site not specified: Secondary | ICD-10-CM

## 2011-01-26 MED ORDER — CITALOPRAM HYDROBROMIDE 10 MG PO TABS: 10.0000 mg | ORAL_TABLET | Freq: Every day | ORAL | Status: DC

## 2011-01-26 MED ORDER — RIVASTIGMINE 4.6 MG/24HR TD PT24: 1.0000 | MEDICATED_PATCH | Freq: Every day | TRANSDERMAL | Status: DC

## 2011-01-26 MED ORDER — FAMOTIDINE 20 MG PO TABS: 20.0000 mg | ORAL_TABLET | Freq: Two times a day (BID) | ORAL | Status: DC

## 2011-01-26 MED ORDER — HYDROCODONE-ACETAMINOPHEN 10-325 MG PO TABS: 1.0000 | ORAL_TABLET | Freq: Two times a day (BID) | ORAL | Status: AC | PRN

## 2011-01-26 MED ORDER — PANTOPRAZOLE SODIUM 40 MG PO TBEC: 40.0000 mg | DELAYED_RELEASE_TABLET | Freq: Every day | ORAL | Status: DC

## 2011-01-26 MED ORDER — INFLUENZA VAC TYPES A & B PF IM SUSP: 0.5000 mL | Freq: Once | INTRAMUSCULAR | Status: DC

## 2011-01-26 MED ORDER — HYDROCORTISONE 2.5 % RE CREA: TOPICAL_CREAM | RECTAL | Status: DC

## 2011-01-26 MED ORDER — FUROSEMIDE 20 MG PO TABS: 20.0000 mg | ORAL_TABLET | Freq: Every day | ORAL | Status: DC

## 2011-01-26 MED ORDER — QUETIAPINE FUMARATE 25 MG PO TABS: 25.0000 mg | ORAL_TABLET | Freq: Every day | ORAL | Status: DC

## 2011-01-26 MED ORDER — POTASSIUM CHLORIDE CRYS ER 20 MEQ PO TBCR: EXTENDED_RELEASE_TABLET | ORAL | Status: DC

## 2011-01-26 MED ORDER — FLUTICASONE-SALMETEROL 100-50 MCG/DOSE IN AEPB: 1.0000 | INHALATION_SPRAY | Freq: Two times a day (BID) | RESPIRATORY_TRACT | Status: DC

## 2011-01-26 MED ORDER — KETOROLAC TROMETHAMINE 60 MG/2ML IM SOLN
60.0000 mg | Freq: Once | INTRAMUSCULAR | Status: AC
  Administered 2011-01-26: 60 mg via INTRAMUSCULAR

## 2011-01-26 MED ORDER — AMLODIPINE BESYLATE 5 MG PO TABS: 5.0000 mg | ORAL_TABLET | Freq: Every day | ORAL | Status: DC

## 2011-01-26 NOTE — Patient Instructions (Addendum)
F/u in 3 months.  You will get injection and medication for back pain, also TDaP and flu vaccine.  Your urine is being tested for infection, we will wait on culture results  Medications to be continued as before  You look well. I hope you get into an assisted living facility soon

## 2011-01-27 ENCOUNTER — Telehealth: Payer: Self-pay | Admitting: Family Medicine

## 2011-01-27 NOTE — Telephone Encounter (Signed)
Needed clarification on seroquel, was advised half tab

## 2011-01-31 MED ORDER — NITROFURANTOIN MONOHYD MACRO 100 MG PO CAPS: 100.0000 mg | ORAL_CAPSULE | Freq: Two times a day (BID) | ORAL | Status: DC

## 2011-01-31 NOTE — Assessment & Plan Note (Signed)
Controlled, no change in medication  
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
## 2011-01-31 NOTE — Assessment & Plan Note (Signed)
Pt on medication, currently the family is looking for an assisted living facility

## 2011-01-31 NOTE — Assessment & Plan Note (Signed)
Unchanged, reportedly needs medication, some "got taken" , will fill when due only

## 2011-01-31 NOTE — Assessment & Plan Note (Signed)
Improved, continue current med 

## 2011-01-31 NOTE — Assessment & Plan Note (Signed)
Pt asymptomatic, has multiple allergies, will wait on results before treating

## 2011-01-31 NOTE — Assessment & Plan Note (Addendum)
UnControlled, no change in medication, toradol administered at OV

## 2011-02-02 MED ORDER — NITROFURANTOIN MONOHYD MACRO 100 MG PO CAPS: 100.0000 mg | ORAL_CAPSULE | Freq: Two times a day (BID) | ORAL | Status: AC

## 2011-02-02 NOTE — Progress Notes (Signed)
Addended by: Adella Hare B on: 02/02/2011 04:54 PM   Modules accepted: Orders

## 2011-02-04 ENCOUNTER — Other Ambulatory Visit: Payer: Self-pay

## 2011-02-04 MED ORDER — ALPRAZOLAM 0.5 MG PO TABS: ORAL_TABLET | ORAL | Status: DC

## 2011-02-12 LAB — CBC
HCT: 30.3 — ABNORMAL LOW
HCT: 26.9 — ABNORMAL LOW
HCT: 27.6 — ABNORMAL LOW
HCT: 29.7 — ABNORMAL LOW
Hemoglobin: 10.3 — ABNORMAL LOW
Hemoglobin: 10 — ABNORMAL LOW
Hemoglobin: 10.7 — ABNORMAL LOW
Hemoglobin: 9.4 — ABNORMAL LOW
MCHC: 33.9
MCHC: 34
MCHC: 34
MCHC: 34.3
MCHC: 34.6
MCV: 90.3
MCV: 91.2
MCV: 91.3
MCV: 91.9
Platelets: 235
Platelets: 275
Platelets: 278
RBC: 3.36 — ABNORMAL LOW
RBC: 3.27 — ABNORMAL LOW
RDW: 13.3
RDW: 13.4
RDW: 13.5
RDW: 14.3
WBC: 10.2
WBC: 12.7 — ABNORMAL HIGH
WBC: 9.3

## 2011-02-12 LAB — COMPREHENSIVE METABOLIC PANEL
ALT: 14
BUN: 6
CO2: 32
Calcium: 8.4
GFR calc non Af Amer: >60
Glucose, Bld: 110 — ABNORMAL HIGH
Sodium: 141

## 2011-02-12 LAB — URINALYSIS, ROUTINE W REFLEX MICROSCOPIC
Bilirubin Urine: NEGATIVE
Bilirubin Urine: NEGATIVE
Glucose, UA: NEGATIVE
Glucose, UA: NEGATIVE
Glucose, UA: NEGATIVE
Hgb urine dipstick: NEGATIVE
Hgb urine dipstick: NEGATIVE
Ketones, ur: NEGATIVE
Ketones, ur: NEGATIVE
Ketones, ur: NEGATIVE
Leukocytes, UA: NEGATIVE
Nitrite: NEGATIVE
Protein, ur: 30 — AB
Protein, ur: NEGATIVE
Specific Gravity, Urine: 1.011
Urobilinogen, UA: 0.2
pH: 6
pH: 7.5

## 2011-02-12 LAB — BASIC METABOLIC PANEL
BUN: 6
BUN: 6
BUN: 8
CO2: 27
CO2: 28
CO2: 28
CO2: 31
Calcium: 7.8 — ABNORMAL LOW
Calcium: 8.1 — ABNORMAL LOW
Calcium: 8.2 — ABNORMAL LOW
Calcium: 8.3 — ABNORMAL LOW
Chloride: 103
Chloride: 104
Chloride: 107
Creatinine, Ser: 0.69
Creatinine, Ser: 0.74
Glucose, Bld: 105 — ABNORMAL HIGH
Glucose, Bld: 109 — ABNORMAL HIGH
Glucose, Bld: 111 — ABNORMAL HIGH
Sodium: 136
Sodium: 138

## 2011-02-12 LAB — FOLATE: Folate: 6.4

## 2011-02-12 LAB — PROTIME-INR
INR: 0.9
INR: 1.1
INR: 1.8 — ABNORMAL HIGH
Prothrombin Time: 12.4
Prothrombin Time: 15

## 2011-02-12 LAB — URINE CULTURE
Colony Count: NO GROWTH
Colony Count: NO GROWTH
Culture: NO GROWTH
Culture: NONE SEEN
Special Requests: NEGATIVE

## 2011-02-12 LAB — DIFFERENTIAL
Basophils Relative: 0
Eosinophils Absolute: 0.3
Lymphs Abs: 2.7
Neutro Abs: 9.3 — ABNORMAL HIGH
Neutrophils Relative %: 71

## 2011-02-12 LAB — CULTURE, BLOOD (ROUTINE X 2)
Culture: NO GROWTH
Culture: NO GROWTH
Culture: NO GROWTH

## 2011-02-12 LAB — TYPE AND SCREEN: Antibody Screen: NEGATIVE

## 2011-02-12 LAB — GC/CHLAMYDIA PROBE AMP, URINE: GC Probe Amp, Urine: NEGATIVE

## 2011-02-12 LAB — FERRITIN: Ferritin: 79 (ref 10–291)

## 2011-02-12 LAB — POCT I-STAT 4, (NA,K, GLUC, HGB,HCT)
Glucose, Bld: 104 — ABNORMAL HIGH
Operator id: 117071
Potassium: 4.2
Sodium: 139

## 2011-02-12 LAB — ABO/RH: ABO/RH(D): A POS

## 2011-02-12 LAB — RETICULOCYTES: Retic Ct Pct: 1.5

## 2011-02-12 LAB — IRON AND TIBC
Saturation Ratios: 9 — ABNORMAL LOW
TIBC: 242 — ABNORMAL LOW
UIBC: 220

## 2011-02-12 LAB — TSH: TSH: 0.916

## 2011-02-16 LAB — CBC
MCV: 89.8
Platelets: 331
WBC: 8.9

## 2011-02-16 LAB — BASIC METABOLIC PANEL
BUN: 5 — ABNORMAL LOW
Chloride: 100
Creatinine, Ser: 0.73

## 2011-02-16 LAB — URINE MICROSCOPIC-ADD ON

## 2011-02-16 LAB — URINALYSIS, ROUTINE W REFLEX MICROSCOPIC
Glucose, UA: NEGATIVE
Protein, ur: NEGATIVE
Specific Gravity, Urine: 1.01
pH: 7

## 2011-02-16 LAB — DIFFERENTIAL
Basophils Absolute: 0
Basophils Relative: 0
Eosinophils Absolute: 0.2
Lymphs Abs: 2.1
Neutrophils Relative %: 65

## 2011-02-22 LAB — ETHANOL: Alcohol, Ethyl (B): <5

## 2011-02-22 LAB — BASIC METABOLIC PANEL
CO2: 31
Calcium: 8.9
GFR calc Af Amer: >60
GFR calc non Af Amer: >60
Sodium: 138

## 2011-02-22 LAB — DIFFERENTIAL
Lymphocytes Relative: 23
Lymphs Abs: 2.2
Monocytes Absolute: 0.6
Monocytes Relative: 6
Neutro Abs: 6.6

## 2011-02-22 LAB — CBC
Hemoglobin: 12.4
MCHC: 33
RBC: 4.24
WBC: 9.5

## 2011-04-16 ENCOUNTER — Other Ambulatory Visit: Payer: Self-pay | Admitting: Family Medicine

## 2011-04-20 ENCOUNTER — Telehealth: Payer: Self-pay | Admitting: Family Medicine

## 2011-04-20 MED ORDER — ALPRAZOLAM 0.5 MG PO TABS: ORAL_TABLET | ORAL | Status: DC

## 2011-04-20 NOTE — Telephone Encounter (Signed)
Printed for Dr to sign  

## 2011-04-29 ENCOUNTER — Encounter: Payer: Self-pay | Admitting: Family Medicine

## 2011-05-04 ENCOUNTER — Ambulatory Visit (INDEPENDENT_AMBULATORY_CARE_PROVIDER_SITE_OTHER): Payer: Medicare Other | Admitting: Family Medicine

## 2011-05-04 ENCOUNTER — Encounter: Payer: Self-pay | Admitting: Family Medicine

## 2011-05-04 VITALS — BP 116/70 | HR 91 | Resp 20 | Ht 63.0 in | Wt 208.1 lb

## 2011-05-04 DIAGNOSIS — F068 Other specified mental disorders due to known physiological condition: Secondary | ICD-10-CM

## 2011-05-04 DIAGNOSIS — E669 Obesity, unspecified: Secondary | ICD-10-CM

## 2011-05-04 DIAGNOSIS — F411 Generalized anxiety disorder: Secondary | ICD-10-CM

## 2011-05-04 DIAGNOSIS — R7309 Other abnormal glucose: Secondary | ICD-10-CM

## 2011-05-04 DIAGNOSIS — R5383 Other fatigue: Secondary | ICD-10-CM

## 2011-05-04 DIAGNOSIS — R5381 Other malaise: Secondary | ICD-10-CM

## 2011-05-04 DIAGNOSIS — J45909 Unspecified asthma, uncomplicated: Secondary | ICD-10-CM

## 2011-05-04 DIAGNOSIS — M1990 Unspecified osteoarthritis, unspecified site: Secondary | ICD-10-CM

## 2011-05-04 DIAGNOSIS — M129 Arthropathy, unspecified: Secondary | ICD-10-CM

## 2011-05-04 DIAGNOSIS — F329 Major depressive disorder, single episode, unspecified: Secondary | ICD-10-CM

## 2011-05-04 DIAGNOSIS — I10 Essential (primary) hypertension: Secondary | ICD-10-CM

## 2011-05-04 MED ORDER — ALPRAZOLAM 0.5 MG PO TABS: ORAL_TABLET | ORAL | Status: DC

## 2011-05-04 MED ORDER — POTASSIUM CHLORIDE CRYS ER 20 MEQ PO TBCR: EXTENDED_RELEASE_TABLET | ORAL | Status: DC

## 2011-05-04 MED ORDER — PANTOPRAZOLE SODIUM 40 MG PO TBEC: 40.0000 mg | DELAYED_RELEASE_TABLET | Freq: Every day | ORAL | Status: DC

## 2011-05-04 MED ORDER — FLUTICASONE-SALMETEROL 100-50 MCG/DOSE IN AEPB: 1.0000 | INHALATION_SPRAY | Freq: Two times a day (BID) | RESPIRATORY_TRACT | Status: DC

## 2011-05-04 MED ORDER — AMLODIPINE BESYLATE 5 MG PO TABS: 5.0000 mg | ORAL_TABLET | Freq: Every day | ORAL | Status: DC

## 2011-05-04 MED ORDER — HYDROCORTISONE 2.5 % RE CREA: TOPICAL_CREAM | RECTAL | Status: AC

## 2011-05-04 MED ORDER — FAMOTIDINE 20 MG PO TABS: 20.0000 mg | ORAL_TABLET | Freq: Two times a day (BID) | ORAL | Status: DC

## 2011-05-04 MED ORDER — FUROSEMIDE 20 MG PO TABS: 20.0000 mg | ORAL_TABLET | Freq: Every day | ORAL | Status: DC

## 2011-05-04 MED ORDER — CITALOPRAM HYDROBROMIDE 10 MG PO TABS: 10.0000 mg | ORAL_TABLET | Freq: Every day | ORAL | Status: DC

## 2011-05-04 MED ORDER — HYDROCODONE-ACETAMINOPHEN 10-325 MG PO TABS: 1.0000 | ORAL_TABLET | Freq: Four times a day (QID) | ORAL | Status: DC | PRN

## 2011-05-04 MED ORDER — QUETIAPINE FUMARATE 25 MG PO TABS: 25.0000 mg | ORAL_TABLET | Freq: Every day | ORAL | Status: DC

## 2011-05-04 MED ORDER — RIVASTIGMINE 4.6 MG/24HR TD PT24: 1.0000 | MEDICATED_PATCH | Freq: Every day | TRANSDERMAL | Status: DC

## 2011-05-04 NOTE — Progress Notes (Signed)
  Subjective:    Patient ID: [name], female    DOB: [birth year], 76 y.o.   MRN: [id number]  HPI The PT is here for follow up and re-evaluation of chronic medical conditions, medication management and review of any available recent lab and radiology data.  Preventive health is updated, specifically  Cancer screening and Immunization.   Questions or concerns regarding consultations or procedures which the PT has had in the interim are  addressed. The PT denies any adverse reactions to current medications since the last visit.  There are no new concerns.  There are no specific complaints       Review of Systems See HPI Denies recent fever or chills. Denies sinus pressure, nasal congestion, ear pain or sore throat. Denies chest congestion, productive cough or wheezing. Denies chest pains, palpitations and leg swelling Denies abdominal pain, nausea, vomiting,diarrhea or constipation.   Denies dysuria, frequency, hesitancy or incontinence. Chronic joint pain with reduction in mobility Denies headaches, seizures, numbness, or tingling. Denies uncontrolled  depression, anxiety or insomnia. Denies skin break down or rash.        Objective:   Physical Exam Patient alert and oriented and in no cardiopulmonary distress.  HEENT: No facial asymmetry, EOMI, no sinus tenderness,  oropharynx pink and moist.  Neck supple no adenopathy.  Chest: Clear to auscultation bilaterally.  CVS: S1, S2 no murmurs, no S3.  ABD: Soft non tender. Bowel sounds normal.  Ext: No edema  MS: Decreased  ROM spine, shoulders, hips and knees.  Skin: Intact, no ulcerations or rash noted.  Psych: Good eye contact, normal affect. Memory mildly impaired not anxious or depressed appearing.  CNS: CN 2-12 intact, power, tone and sensation normal throughout.        Assessment & Plan:

## 2011-05-04 NOTE — Patient Instructions (Signed)
F/u in 4 months.  HBA1C, chem 7, tSH today. Congrats on weight loss.  No changes in medication

## 2011-05-05 LAB — HEMOGLOBIN A1C
Hgb A1c MFr Bld: 5.5 % (ref <5.7)
Mean Plasma Glucose: 111 mg/dL (ref <117)

## 2011-05-05 LAB — COMPREHENSIVE METABOLIC PANEL
ALT: 8 U/L (ref 0–35)
AST: 18 U/L (ref 0–37)
Albumin: 3.9 g/dL (ref 3.5–5.2)
Alkaline Phosphatase: 87 U/L (ref 39–117)
Calcium: 9 mg/dL (ref 8.4–10.5)
Chloride: 100 mEq/L (ref 96–112)
Potassium: 4.4 mEq/L (ref 3.5–5.3)
Sodium: 140 mEq/L (ref 135–145)

## 2011-05-09 NOTE — Assessment & Plan Note (Signed)
Improved. Pt applauded on succesful weight loss through lifestyle change, and encouraged to continue same. Weight loss goal set for the next several months.  

## 2011-05-09 NOTE — Assessment & Plan Note (Signed)
Controlled, no change in medication  

## 2011-05-09 NOTE — Assessment & Plan Note (Signed)
Stable, continue exelon patch

## 2011-05-09 NOTE — Assessment & Plan Note (Signed)
Improved and controlled on current medication 

## 2011-05-20 ENCOUNTER — Other Ambulatory Visit: Payer: Self-pay | Admitting: Family Medicine

## 2011-05-26 ENCOUNTER — Other Ambulatory Visit: Payer: Self-pay | Admitting: Family Medicine

## 2011-06-01 ENCOUNTER — Other Ambulatory Visit: Payer: Self-pay | Admitting: Family Medicine

## 2011-06-02 ENCOUNTER — Telehealth: Payer: Self-pay | Admitting: Family Medicine

## 2011-06-03 NOTE — Telephone Encounter (Signed)
Already been sent in yesterday

## 2011-06-13 ENCOUNTER — Other Ambulatory Visit: Payer: Self-pay | Admitting: Family Medicine

## 2011-06-15 ENCOUNTER — Telehealth: Payer: Self-pay | Admitting: Family Medicine

## 2011-06-16 NOTE — Telephone Encounter (Signed)
Patient aware of last lab results 

## 2011-06-17 ENCOUNTER — Telehealth: Payer: Self-pay | Admitting: Family Medicine

## 2011-06-17 NOTE — Telephone Encounter (Signed)
[facility] said [facility] does [facility]ills and they will get it ready for her

## 2011-06-25 ENCOUNTER — Telehealth: Payer: Self-pay | Admitting: Family Medicine

## 2011-06-25 NOTE — Telephone Encounter (Signed)
Advised to check with pharmacist to make sure there is not interaction with current meds

## 2011-07-12 ENCOUNTER — Other Ambulatory Visit: Payer: Self-pay | Admitting: Family Medicine

## 2011-07-25 ENCOUNTER — Emergency Department (HOSPITAL_COMMUNITY): Payer: Medicare PPO

## 2011-07-25 ENCOUNTER — Encounter (HOSPITAL_COMMUNITY): Payer: Self-pay

## 2011-07-25 ENCOUNTER — Emergency Department (HOSPITAL_COMMUNITY)
Admission: EM | Admit: 2011-07-25 | Discharge: 2011-07-25 | Disposition: A | Discharge status: Home / Self Care | Payer: Medicare PPO | Attending: Emergency Medicine | Admitting: Emergency Medicine

## 2011-07-25 DIAGNOSIS — J4489 Other specified chronic obstructive pulmonary disease: Secondary | ICD-10-CM | POA: Insufficient documentation

## 2011-07-25 DIAGNOSIS — J449 Chronic obstructive pulmonary disease, unspecified: Secondary | ICD-10-CM | POA: Insufficient documentation

## 2011-07-25 DIAGNOSIS — F332 Major depressive disorder, recurrent severe without psychotic features: Secondary | ICD-10-CM | POA: Insufficient documentation

## 2011-07-25 DIAGNOSIS — N390 Urinary tract infection, site not specified: Secondary | ICD-10-CM

## 2011-07-25 DIAGNOSIS — R05 Cough: Secondary | ICD-10-CM

## 2011-07-25 DIAGNOSIS — R059 Cough, unspecified: Secondary | ICD-10-CM | POA: Insufficient documentation

## 2011-07-25 DIAGNOSIS — I10 Essential (primary) hypertension: Secondary | ICD-10-CM | POA: Insufficient documentation

## 2011-07-25 DIAGNOSIS — K219 Gastro-esophageal reflux disease without esophagitis: Secondary | ICD-10-CM | POA: Insufficient documentation

## 2011-07-25 DIAGNOSIS — M1990 Unspecified osteoarthritis, unspecified site: Secondary | ICD-10-CM | POA: Insufficient documentation

## 2011-07-25 DIAGNOSIS — Z79899 Other long term (current) drug therapy: Secondary | ICD-10-CM | POA: Insufficient documentation

## 2011-07-25 DIAGNOSIS — J3489 Other specified disorders of nose and nasal sinuses: Secondary | ICD-10-CM | POA: Insufficient documentation

## 2011-07-25 LAB — URINALYSIS, ROUTINE W REFLEX MICROSCOPIC
Bilirubin Urine: NEGATIVE
Ketones, ur: NEGATIVE mg/dL
Nitrite: NEGATIVE
Urobilinogen, UA: 0.2 mg/dL (ref 0.0–1.0)

## 2011-07-25 LAB — URINE MICROSCOPIC-ADD ON

## 2011-07-25 MED ORDER — NITROFURANTOIN MACROCRYSTAL 100 MG PO CAPS: ORAL_CAPSULE | ORAL | Status: DC

## 2011-07-25 MED ORDER — ALBUTEROL SULFATE (2.5 MG/3ML) 0.083% IN NEBU: 2.5000 mg | INHALATION_SOLUTION | Freq: Four times a day (QID) | RESPIRATORY_TRACT | Status: DC | PRN

## 2011-07-25 MED ORDER — ALBUTEROL SULFATE (5 MG/ML) 0.5% IN NEBU
2.5000 mg | INHALATION_SOLUTION | Freq: Once | RESPIRATORY_TRACT | Status: AC
  Administered 2011-07-25: 2.5 mg via RESPIRATORY_TRACT
  Filled 2011-07-25: qty 0.5

## 2011-07-25 MED ORDER — NITROFURANTOIN MACROCRYSTAL 100 MG PO CAPS
100.0000 mg | ORAL_CAPSULE | Freq: Once | ORAL | Status: AC
  Administered 2011-07-25: 100 mg via ORAL
  Filled 2011-07-25: qty 1

## 2011-07-25 NOTE — ED Notes (Signed)
Pt denies any fevers. 

## 2011-07-25 NOTE — ED Notes (Signed)
Pt up to bathroom but unable to void.

## 2011-07-25 NOTE — ED Notes (Signed)
Pt reports cough/congestion, fatigue, and some sob for the past week.  Pt has hx of copd.  Pt also reports thinking that she may have a UTI.
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
## 2011-07-29 ENCOUNTER — Ambulatory Visit: Payer: Medicare Other | Admitting: Family Medicine

## 2011-08-04 ENCOUNTER — Telehealth: Payer: Self-pay | Admitting: Family Medicine

## 2011-08-04 MED ORDER — HYDROCODONE-ACETAMINOPHEN 10-325 MG PO TABS: 1.0000 | ORAL_TABLET | Freq: Four times a day (QID) | ORAL | Status: DC | PRN

## 2011-08-04 NOTE — Telephone Encounter (Signed)
Printed for Dr to sign  

## 2011-08-05 ENCOUNTER — Encounter: Payer: Self-pay | Admitting: Family Medicine

## 2011-08-05 ENCOUNTER — Ambulatory Visit (INDEPENDENT_AMBULATORY_CARE_PROVIDER_SITE_OTHER): Payer: 59 | Admitting: Family Medicine

## 2011-08-05 VITALS — BP 130/80 | HR 83 | Resp 17 | Ht 63.0 in | Wt 193.8 lb

## 2011-08-05 DIAGNOSIS — G309 Alzheimer's disease, unspecified: Secondary | ICD-10-CM

## 2011-08-05 DIAGNOSIS — F0280 Dementia in other diseases classified elsewhere without behavioral disturbance: Secondary | ICD-10-CM

## 2011-08-05 DIAGNOSIS — J45909 Unspecified asthma, uncomplicated: Secondary | ICD-10-CM

## 2011-08-05 DIAGNOSIS — J209 Acute bronchitis, unspecified: Secondary | ICD-10-CM

## 2011-08-05 DIAGNOSIS — F411 Generalized anxiety disorder: Secondary | ICD-10-CM

## 2011-08-05 DIAGNOSIS — F3289 Other specified depressive episodes: Secondary | ICD-10-CM

## 2011-08-05 DIAGNOSIS — I10 Essential (primary) hypertension: Secondary | ICD-10-CM

## 2011-08-05 DIAGNOSIS — F329 Major depressive disorder, single episode, unspecified: Secondary | ICD-10-CM

## 2011-08-05 DIAGNOSIS — M549 Dorsalgia, unspecified: Secondary | ICD-10-CM

## 2011-08-05 NOTE — Progress Notes (Signed)
  Subjective:    Patient ID: [name], female    DOB: 1934/12/27, 77 y.o.   MRN: [id number]  HPI The PT is here for follow up and re-evaluation of chronic medical conditions, medication management and review of any available recent lab and radiology data.  Preventive health is updated, specifically  Cancer screening and Immunization.   Pt was recently in the ED with a uTI, accompanying family members have concerns over the fact that she is often asymptomatic The PT denies any adverse reactions to current medications since the last visit.  There are no new concerns.      Review of Systems See HPI Denies recent fever or chills. Denies sinus pressure, nasal congestion, ear pain or sore throat. Denies chest congestion, productive cough or wheezing. Denies chest pains, palpitations and leg swelling Denies abdominal pain, nausea, vomiting,diarrhea or constipation.   Denies dysuria, frequency, hesitancy or incontinence. C/o joint pain, swelling and limitation in mobility. Denies headaches, seizures, numbness, or tingling. Denies depression, anxiety or insomnia. Denies skin break down or rash.        Objective:   Physical Exam Patient alert and oriented and in no cardiopulmonary distress.  HEENT: No facial asymmetry, EOMI, no sinus tenderness,  oropharynx pink and moist.  Neck supple no adenopathy.  Chest: Decreased air entry , no wheezes or crackles  CVS: S1, S2 no murmurs, no S3.  ABD: Soft non tender. Bowel sounds normal.  Ext: No edema  [id number]  ROM spine, shoulders, hips and knees.  Skin: Intact, no ulcerations or rash noted.  Psych: Good eye contact, normal affect. Memory loss not anxious or depressed appearing.  CNS: CN 2-12 intact, power, tone and sensation normal throughout.        Assessment & Plan:

## 2011-08-05 NOTE — Patient Instructions (Signed)
F/U in 4.5 month.  Please call if you need to be seen before   Please keep a close watch for urinary tract infection, drink a lot of water , and empty often   No changes in medication.  You will be referred to [facility], a Child psychotherapist will meet with you and your children to see how better your health care needs can be met  Nop labs due ar this time

## 2011-08-10 ENCOUNTER — Telehealth: Payer: Self-pay | Admitting: Family Medicine

## 2011-08-11 MED ORDER — BENZONATATE 100 MG PO CAPS: 100.0000 mg | ORAL_CAPSULE | Freq: Four times a day (QID) | ORAL | Status: DC | PRN

## 2011-08-11 NOTE — Telephone Encounter (Signed)
Pt aware.

## 2011-08-11 NOTE — Telephone Encounter (Signed)
Pt states she has been oughing and wheezing. Wants something called in for her. States she can't get rid of it. Not producing any phlegm .

## 2011-08-11 NOTE — Telephone Encounter (Signed)
pls let her know tessalon perles sent in

## 2011-08-15 NOTE — Assessment & Plan Note (Signed)
Controlled, no change in medication  

## 2011-08-15 NOTE — Assessment & Plan Note (Signed)
Stable and controlled

## 2011-08-15 NOTE — Assessment & Plan Note (Signed)
unchanged

## 2011-08-15 NOTE — Assessment & Plan Note (Signed)
Stable on medication, continue same 

## 2011-08-27 ENCOUNTER — Other Ambulatory Visit: Payer: Self-pay | Admitting: Family Medicine

## 2011-08-31 ENCOUNTER — Other Ambulatory Visit: Payer: Self-pay | Admitting: Family Medicine

## 2011-09-06 ENCOUNTER — Telehealth: Payer: Self-pay | Admitting: Family Medicine

## 2011-09-06 MED ORDER — HYDROCODONE-ACETAMINOPHEN 10-325 MG PO TABS: 1.0000 | ORAL_TABLET | Freq: Four times a day (QID) | ORAL | Status: DC | PRN

## 2011-09-06 NOTE — Telephone Encounter (Signed)
Refill sent in

## 2011-09-12 ENCOUNTER — Other Ambulatory Visit: Payer: Self-pay | Admitting: Family Medicine

## 2011-09-15 ENCOUNTER — Ambulatory Visit: Payer: Medicare Other | Admitting: Family Medicine

## 2011-09-16 ENCOUNTER — Other Ambulatory Visit: Payer: Self-pay | Admitting: Family Medicine

## 2011-09-17 ENCOUNTER — Other Ambulatory Visit: Payer: Self-pay

## 2011-09-17 ENCOUNTER — Telehealth: Payer: Self-pay | Admitting: Family Medicine

## 2011-09-17 MED ORDER — FLUTICASONE-SALMETEROL 100-50 MCG/DOSE IN AEPB: 1.0000 | INHALATION_SPRAY | Freq: Two times a day (BID) | RESPIRATORY_TRACT | Status: DC

## 2011-09-17 NOTE — Telephone Encounter (Signed)
Med sent.

## 2011-09-20 ENCOUNTER — Other Ambulatory Visit: Payer: Self-pay | Admitting: Family Medicine

## 2011-10-11 ENCOUNTER — Other Ambulatory Visit: Payer: Self-pay

## 2011-10-11 ENCOUNTER — Telehealth: Payer: Self-pay | Admitting: Family Medicine

## 2011-10-11 NOTE — Telephone Encounter (Signed)
I have it printed to send. Just waiting on Dr to sign

## 2011-10-28 ENCOUNTER — Other Ambulatory Visit: Payer: Self-pay | Admitting: Family Medicine

## 2011-10-29 ENCOUNTER — Telehealth: Payer: Self-pay | Admitting: Family Medicine

## 2011-10-29 NOTE — Telephone Encounter (Signed)
[name] had no other information other than she needed refills on all her meds. Unable to tell the me names. Will call back and speak to one of her children

## 2011-11-02 NOTE — Telephone Encounter (Signed)
Called pt and she could not tell me the names of any of the meds she needed. Said her daughter takes care of that. I called and left message with daughter to call me back

## 2011-11-08 ENCOUNTER — Other Ambulatory Visit: Payer: Self-pay | Admitting: Family Medicine

## 2011-11-08 ENCOUNTER — Other Ambulatory Visit: Payer: Self-pay

## 2011-11-08 MED ORDER — HYDROCODONE-ACETAMINOPHEN 10-325 MG PO TABS: 1.0000 | ORAL_TABLET | Freq: Four times a day (QID) | ORAL | Status: DC | PRN

## 2011-11-08 MED ORDER — HYDROCODONE-ACETAMINOPHEN 10-325 MG PO TABS: ORAL_TABLET | ORAL | Status: DC

## 2011-11-18 ENCOUNTER — Other Ambulatory Visit: Payer: Self-pay

## 2011-11-18 MED ORDER — ALPRAZOLAM 0.5 MG PO TABS: ORAL_TABLET | ORAL | Status: DC

## 2011-11-22 NOTE — Telephone Encounter (Signed)
Spoke with son and refilled what he said she was needing

## 2011-12-14 ENCOUNTER — Other Ambulatory Visit: Payer: Self-pay

## 2011-12-14 MED ORDER — HYDROCODONE-ACETAMINOPHEN 10-325 MG PO TABS: ORAL_TABLET | ORAL | Status: DC

## 2011-12-22 ENCOUNTER — Encounter: Payer: Self-pay | Admitting: Family Medicine

## 2011-12-22 ENCOUNTER — Ambulatory Visit (INDEPENDENT_AMBULATORY_CARE_PROVIDER_SITE_OTHER): Payer: Medicare PPO | Admitting: Family Medicine

## 2011-12-22 VITALS — BP 130/72 | HR 114 | Resp 18 | Ht 63.0 in | Wt 193.0 lb

## 2011-12-22 DIAGNOSIS — N3000 Acute cystitis without hematuria: Secondary | ICD-10-CM

## 2011-12-22 DIAGNOSIS — J45909 Unspecified asthma, uncomplicated: Secondary | ICD-10-CM

## 2011-12-22 DIAGNOSIS — F411 Generalized anxiety disorder: Secondary | ICD-10-CM

## 2011-12-22 DIAGNOSIS — I10 Essential (primary) hypertension: Secondary | ICD-10-CM

## 2011-12-22 DIAGNOSIS — F0280 Dementia in other diseases classified elsewhere without behavioral disturbance: Secondary | ICD-10-CM

## 2011-12-22 DIAGNOSIS — B369 Superficial mycosis, unspecified: Secondary | ICD-10-CM | POA: Insufficient documentation

## 2011-12-22 DIAGNOSIS — E785 Hyperlipidemia, unspecified: Secondary | ICD-10-CM

## 2011-12-22 DIAGNOSIS — M549 Dorsalgia, unspecified: Secondary | ICD-10-CM

## 2011-12-22 LAB — POCT URINALYSIS DIPSTICK
Bilirubin, UA: NEGATIVE
Glucose, UA: NEGATIVE
Ketones, UA: NEGATIVE
Nitrite, UA: POSITIVE

## 2011-12-22 MED ORDER — DONEPEZIL HCL 5 MG PO TABS: 5.0000 mg | ORAL_TABLET | Freq: Every day | ORAL | Status: DC

## 2011-12-22 MED ORDER — SULFAMETHOXAZOLE-TRIMETHOPRIM 800-160 MG PO TABS: 1.0000 | ORAL_TABLET | Freq: Two times a day (BID) | ORAL | Status: AC

## 2011-12-22 MED ORDER — TERBINAFINE HCL 1 % EX CREA: TOPICAL_CREAM | Freq: Two times a day (BID) | CUTANEOUS | Status: DC

## 2011-12-22 NOTE — Patient Instructions (Addendum)
Annual wellness  in 3 months  Please bring all medication to next visit.  You need to reconsider having a case worker come to your home to asses your health care needs, as far as living independently, managing medication correctly etc. Discuss this with your daughter please  Stop exelon patch since it is irritating you, aricept once daily is sent in  Urine being checked for infection, you seem to have one, medication is sent in take all of it, 7 days total. PLEASE drink a lot of water and empty your bladder often, 64 ounces

## 2011-12-26 LAB — URINE CULTURE: Colony Count: >=100000

## 2011-12-26 NOTE — Progress Notes (Signed)
  Subjective:    Patient ID: [name], female    DOB: 09/10/1934, 77 y.o.   MRN: [id number]  HPI The PT is here for follow up and re-evaluation of chronic medical conditions, medication management and review of any available recent lab and radiology data.  Preventive health is updated, specifically  Cancer screening and Immunization.    The PT c/o itchy rash where she wears the exelon patch and she has stopped as a result. Doesn't think she really needs them She is accompanied by both children. Son who puts out her meds, states that managing her as they are is becoming increasingly difficult to impossible , she is reportedly stubborn and takes her meds as she wishes. Recently called him at 3am stating she didn't feel right which seems to have aggravated him     Review of Systems See HPI Denies recent fever or chills. Denies sinus pressure, nasal congestion, ear pain or sore throat. Denies chest congestion, productive cough or wheezing. Denies chest pains, palpitations and leg swelling Denies abdominal pain, nausea, vomiting,diarrhea or constipation.   7 to 10 h/o malodorous frequent urination, no flank pain or fever Chronic back pain and limitation in mobility.Uses a waklker Denies headaches, seizures, numbness, or tingling. Denies uncontrolled  depression, anxiety or insomnia.States she finds her son "is "getting on her nerves" Denies skin break c/o pruritic rash on buttock where she has had this before        Objective:   Physical Exam Patient alert and oriented and in no cardiopulmonary distress.  HEENT: No facial asymmetry, EOMI, no sinus tenderness,  oropharynx pink and moist.  Neck supple no adenopathy.  Chest: Clear to auscultation bilaterally.Decreased air entry bilaterally  CVS: S1, S2 no murmurs, no S3.  ABD: Soft non tender. Bowel sounds normal.Mild suprapubic tenderness, no renal angle tenderness  Ext: No edema  [id number] ROM spine, shoulders, hips  and knees.  Skin: Intact, fungal rash noted on buttock  Psych: Good eye contact, normal affect. Memory loss, anxious and mildly  depressed appearing.  CNS: CN 2-12 intact, power,  normal throughout.        Assessment & Plan:

## 2011-12-26 NOTE — Assessment & Plan Note (Signed)
A recurrent problem ,recently  pt displayed "abnormal behavior" in the past week,  CCUA is abnormal , will treat and f/u culture

## 2011-12-26 NOTE — Assessment & Plan Note (Signed)
Updated lab needed, dietary management only at this time

## 2011-12-26 NOTE — Assessment & Plan Note (Signed)
Chronic , no change in med

## 2011-12-26 NOTE — Assessment & Plan Note (Signed)
Controlled, no change in medication  

## 2011-12-26 NOTE — Assessment & Plan Note (Signed)
Flare of fungal infection on buttock, cream prescribed

## 2011-12-26 NOTE — Assessment & Plan Note (Signed)
States exelon patch is irritating her will switch to aricept. No visible skin breakdown note at visit, no rash, however states she has stopped using the aptches

## 2011-12-26 NOTE — Assessment & Plan Note (Signed)
Controlled, no change in medication DASH diet and commitment to daily physical activity for a minimum of 30 minutes discussed and encouraged, as a part of hypertension management. The importance of attaining a healthy weight is also discussed.  

## 2011-12-26 NOTE — Assessment & Plan Note (Signed)
Unchanged and controlled, continue med

## 2012-01-10 ENCOUNTER — Other Ambulatory Visit: Payer: Self-pay | Admitting: Family Medicine

## 2012-01-18 ENCOUNTER — Other Ambulatory Visit: Payer: Self-pay

## 2012-01-18 MED ORDER — ALPRAZOLAM 0.5 MG PO TABS: ORAL_TABLET | ORAL | Status: DC

## 2012-02-11 ENCOUNTER — Other Ambulatory Visit: Payer: Self-pay

## 2012-02-11 MED ORDER — HYDROCODONE-ACETAMINOPHEN 10-325 MG PO TABS: ORAL_TABLET | ORAL | Status: DC

## 2012-02-21 ENCOUNTER — Ambulatory Visit: Payer: Medicare PPO | Admitting: Family Medicine

## 2012-02-24 ENCOUNTER — Ambulatory Visit: Payer: Medicare PPO | Admitting: Family Medicine

## 2012-02-25 ENCOUNTER — Other Ambulatory Visit: Payer: Self-pay | Admitting: Family Medicine

## 2012-02-29 DIAGNOSIS — B9789 Other viral agents as the cause of diseases classified elsewhere: Secondary | ICD-10-CM

## 2012-03-16 ENCOUNTER — Other Ambulatory Visit: Payer: Self-pay | Admitting: Family Medicine

## 2012-03-19 ENCOUNTER — Other Ambulatory Visit: Payer: Self-pay | Admitting: Family Medicine

## 2012-03-27 ENCOUNTER — Ambulatory Visit (INDEPENDENT_AMBULATORY_CARE_PROVIDER_SITE_OTHER): Payer: Medicare PPO | Admitting: Family Medicine

## 2012-03-27 ENCOUNTER — Encounter: Payer: Self-pay | Admitting: Family Medicine

## 2012-03-27 VITALS — BP 140/82 | HR 98 | Resp 17 | Ht 63.0 in | Wt 202.0 lb

## 2012-03-27 DIAGNOSIS — E785 Hyperlipidemia, unspecified: Secondary | ICD-10-CM

## 2012-03-27 DIAGNOSIS — R5383 Other fatigue: Secondary | ICD-10-CM

## 2012-03-27 DIAGNOSIS — N390 Urinary tract infection, site not specified: Secondary | ICD-10-CM

## 2012-03-27 DIAGNOSIS — G309 Alzheimer's disease, unspecified: Secondary | ICD-10-CM

## 2012-03-27 DIAGNOSIS — I10 Essential (primary) hypertension: Secondary | ICD-10-CM

## 2012-03-27 DIAGNOSIS — E669 Obesity, unspecified: Secondary | ICD-10-CM

## 2012-03-27 DIAGNOSIS — F411 Generalized anxiety disorder: Secondary | ICD-10-CM

## 2012-03-27 DIAGNOSIS — M899 Disorder of bone, unspecified: Secondary | ICD-10-CM

## 2012-03-27 DIAGNOSIS — M1990 Unspecified osteoarthritis, unspecified site: Secondary | ICD-10-CM

## 2012-03-27 DIAGNOSIS — Z23 Encounter for immunization: Secondary | ICD-10-CM

## 2012-03-27 DIAGNOSIS — N3000 Acute cystitis without hematuria: Secondary | ICD-10-CM

## 2012-03-27 DIAGNOSIS — M949 Disorder of cartilage, unspecified: Secondary | ICD-10-CM

## 2012-03-27 DIAGNOSIS — R5381 Other malaise: Secondary | ICD-10-CM

## 2012-03-27 DIAGNOSIS — M549 Dorsalgia, unspecified: Secondary | ICD-10-CM

## 2012-03-27 MED ORDER — DONEPEZIL HCL 10 MG PO TABS: 10.0000 mg | ORAL_TABLET | Freq: Every day | ORAL | Status: DC

## 2012-03-27 NOTE — Patient Instructions (Addendum)
Annual wellness in 4 month  Urine being tested today.You will be treated after culture available if needed.  Flu vaccine today   Increase in aricept for memory to standard treating dose , 10mg  once daily.  You will be re tested to see if you need oxygen at night  Please schedule your mammogram, you will get number to call  Fasting cbc.lipid, cmp and TSH and vit D asap,   Please ensure you are taking protonix and pepcid as prescribed for abdominal cramps

## 2012-03-27 NOTE — Progress Notes (Signed)
  Subjective:    Patient ID: [name], female    DOB: 1935-01-03, 77 y.o.   MRN: [id number]  HPI The PT is here for follow up and re-evaluation of chronic medical conditions, medication management and review of any available recent lab and radiology data.  Preventive health is updated, specifically  Cancer screening and Immunization.   Questions or concerns regarding consultations or procedures which the PT has had in the interim are  addressed. The PT denies any adverse reactions to current medications since the last visit.  C/o urinary frequency with increased accidents x 3 days, no fever , chills or flank pain Chronic back pain unchanged, ambulates with assistive device, no fall history    Review of Systems See HPI Denies recent fever or chills. Denies sinus pressure, nasal congestion, ear pain or sore throat.Not using nocturnal oxygen, needs to be re tested to see if she needs this Denies chest congestion, productive cough or wheezing. Denies chest pains, palpitations and leg swelling C/o increased  abdominal pain, and cramping, denies nausea, vomiting,diarrhea or constipation.    Chronic  joint pain,  and limitation in mobility. Denies headaches, seizures, numbness, or tingling. C/o depression, anxiety and mild  Insomnia.Not suicidal or homicidal, no hallucinations C/o burning rash on feet       Objective:   Physical Exam Patient alert and oriented and in no cardiopulmonary distress.  HEENT: No facial asymmetry, EOMI, no sinus tenderness,  oropharynx pink and moist.  Neck supple no adenopathy.  Chest: Clear to auscultation bilaterally.  CVS: S1, S2 no murmurs, no S3.  ABD: Soft non tender. Bowel sounds normal.  Ext: No edema  MS: decreased  ROM spine, shoulders, hips and knees.  Skin: Intact, no ulcerations or rash noted.  Psych: Good eye contact, normal affect. Memory loss, both anxious or depressed appearing.  CNS: CN 2-12 intact, power,normal  throughout.        Assessment & Plan:

## 2012-03-29 LAB — POCT URINALYSIS DIPSTICK
Bilirubin, UA: NEGATIVE
Blood, UA: NEGATIVE
Glucose, UA: NEGATIVE
Nitrite, UA: NEGATIVE
Urobilinogen, UA: 0.2

## 2012-03-30 LAB — URINE CULTURE

## 2012-04-02 MED ORDER — ERYTHROMYCIN BASE 500 MG PO TABS: 500.0000 mg | ORAL_TABLET | Freq: Three times a day (TID) | ORAL | Status: DC

## 2012-04-02 NOTE — Assessment & Plan Note (Signed)
stable on current meds which are supervised by her son

## 2012-04-02 NOTE — Assessment & Plan Note (Signed)
urinalsis checked at [facility] will wait on c/s to treat

## 2012-04-02 NOTE — Assessment & Plan Note (Signed)
Unchanged, continue current meds 

## 2012-04-02 NOTE — Assessment & Plan Note (Signed)
Tolerated aricept 5mg  well, will increase to treating dose of 10mg 

## 2012-04-02 NOTE — Assessment & Plan Note (Signed)
unchanged Patient re-educated about  the importance of commitment to a  minimum of 150 minutes of exercise per week. The importance of healthy food choices with portion control discussed. Encouraged to start a food diary, count calories and to consider  joining a support group. Sample diet sheets offered. Goals set by the patient for the next several months.    

## 2012-04-02 NOTE — Assessment & Plan Note (Signed)
Hyperlipidemia:Low fat diet discussed and encouraged.  Updated labs are past due 

## 2012-04-02 NOTE — Assessment & Plan Note (Signed)
Controlled, no change in medication DASH diet and commitment to daily physical activity for a minimum of 30 minutes discussed and encouraged, as a part of hypertension management. The importance of attaining a healthy weight is also discussed.  

## 2012-04-02 NOTE — Assessment & Plan Note (Signed)
Increased neuropathic foot pain, pt to take gabapentin as prescribed

## 2012-04-08 LAB — COMPREHENSIVE METABOLIC PANEL
AST: 17 U/L (ref 0–37)
Albumin: 3.9 g/dL (ref 3.5–5.2)
Alkaline Phosphatase: 85 U/L (ref 39–117)
BUN: 11 mg/dL (ref 6–23)
Creat: 0.82 mg/dL (ref 0.50–1.10)
Glucose, Bld: 85 mg/dL (ref 70–99)
Potassium: 5.1 mEq/L (ref 3.5–5.3)
Total Bilirubin: 0.3 mg/dL (ref 0.3–1.2)

## 2012-04-08 LAB — LIPID PANEL
Cholesterol: 167 mg/dL (ref 0–200)
HDL: 42 mg/dL (ref >39)
Total CHOL/HDL Ratio: 4 Ratio
Triglycerides: 143 mg/dL (ref <150)
VLDL: 29 mg/dL (ref 0–40)

## 2012-04-08 LAB — CBC WITH DIFFERENTIAL/PLATELET
Hemoglobin: 11.5 g/dL — ABNORMAL LOW (ref 12.0–15.0)
Lymphocytes Relative: 31 % (ref 12–46)
Lymphs Abs: 2.3 10*3/uL (ref 0.7–4.0)
Monocytes Relative: 9 % (ref 3–12)
Neutro Abs: 4.1 10*3/uL (ref 1.7–7.7)
Neutrophils Relative %: 54 % (ref 43–77)
Platelets: 311 10*3/uL (ref 150–400)
RBC: 3.92 MIL/uL (ref 3.87–5.11)
WBC: 7.5 10*3/uL (ref 4.0–10.5)

## 2012-04-10 ENCOUNTER — Other Ambulatory Visit: Payer: Self-pay | Admitting: Family Medicine

## 2012-04-10 LAB — VITAMIN D 25 HYDROXY (VIT D DEFICIENCY, FRACTURES): Vit D, 25-Hydroxy: 12 ng/mL — ABNORMAL LOW (ref 30–89)

## 2012-04-11 ENCOUNTER — Other Ambulatory Visit: Payer: Self-pay | Admitting: Family Medicine

## 2012-04-19 ENCOUNTER — Other Ambulatory Visit: Payer: Self-pay

## 2012-04-19 MED ORDER — ERGOCALCIFEROL 1.25 MG (50000 UT) PO CAPS: 50000.0000 [IU] | ORAL_CAPSULE | ORAL | Status: DC

## 2012-04-24 ENCOUNTER — Other Ambulatory Visit: Payer: Self-pay | Admitting: Family Medicine

## 2012-04-24 DIAGNOSIS — Z139 Encounter for screening, unspecified: Secondary | ICD-10-CM

## 2012-04-25 ENCOUNTER — Encounter: Payer: Self-pay | Admitting: Family Medicine

## 2012-05-03 ENCOUNTER — Telehealth: Payer: Self-pay | Admitting: Family Medicine

## 2012-05-04 NOTE — Telephone Encounter (Signed)
Patient aware.

## 2012-05-04 NOTE — Telephone Encounter (Signed)
advise ok to take aleve one twice daily for the next 3 days to help with the pain

## 2012-05-05 ENCOUNTER — Other Ambulatory Visit: Payer: Self-pay | Admitting: Family Medicine

## 2012-05-08 ENCOUNTER — Other Ambulatory Visit: Payer: Self-pay | Admitting: Family Medicine

## 2012-05-08 ENCOUNTER — Ambulatory Visit (HOSPITAL_COMMUNITY): Payer: Medicare PPO

## 2012-05-15 ENCOUNTER — Ambulatory Visit (HOSPITAL_COMMUNITY): Payer: Medicare PPO

## 2012-05-20 ENCOUNTER — Other Ambulatory Visit: Payer: Self-pay | Admitting: Family Medicine

## 2012-05-22 ENCOUNTER — Telehealth: Payer: Self-pay | Admitting: Family Medicine

## 2012-05-29 ENCOUNTER — Ambulatory Visit (HOSPITAL_COMMUNITY)
Admission: RE | Admit: 2012-05-29 | Discharge: 2012-05-29 | Disposition: A | Discharge status: Home / Self Care | Payer: Medicare PPO | Source: Ambulatory Visit | Attending: Family Medicine | Admitting: Family Medicine

## 2012-05-29 DIAGNOSIS — Z1231 Encounter for screening mammogram for malignant neoplasm of breast: Secondary | ICD-10-CM | POA: Insufficient documentation

## 2012-05-29 DIAGNOSIS — Z139 Encounter for screening, unspecified: Secondary | ICD-10-CM

## 2012-06-01 ENCOUNTER — Other Ambulatory Visit: Payer: Self-pay | Admitting: Family Medicine

## 2012-06-01 DIAGNOSIS — R928 Other abnormal and inconclusive findings on diagnostic imaging of breast: Secondary | ICD-10-CM

## 2012-06-02 ENCOUNTER — Other Ambulatory Visit: Payer: Self-pay

## 2012-06-02 MED ORDER — POTASSIUM CHLORIDE CRYS ER 20 MEQ PO TBCR: EXTENDED_RELEASE_TABLET | ORAL | Status: DC

## 2012-06-02 NOTE — Telephone Encounter (Signed)
meds requested sent in

## 2012-06-28 ENCOUNTER — Ambulatory Visit (HOSPITAL_COMMUNITY)
Admission: RE | Admit: 2012-06-28 | Discharge: 2012-06-28 | Disposition: A | Discharge status: Home / Self Care | Payer: Medicare PPO | Source: Ambulatory Visit | Attending: Family Medicine | Admitting: Family Medicine

## 2012-06-28 ENCOUNTER — Other Ambulatory Visit: Payer: Self-pay | Admitting: Family Medicine

## 2012-06-28 DIAGNOSIS — R928 Other abnormal and inconclusive findings on diagnostic imaging of breast: Secondary | ICD-10-CM

## 2012-06-29 ENCOUNTER — Other Ambulatory Visit: Payer: Self-pay | Admitting: Family Medicine

## 2012-06-29 DIAGNOSIS — R928 Other abnormal and inconclusive findings on diagnostic imaging of breast: Secondary | ICD-10-CM

## 2012-07-18 ENCOUNTER — Other Ambulatory Visit: Payer: Self-pay | Admitting: Family Medicine

## 2012-07-25 ENCOUNTER — Ambulatory Visit: Payer: Medicare PPO | Admitting: Family Medicine

## 2012-08-16 ENCOUNTER — Ambulatory Visit (INDEPENDENT_AMBULATORY_CARE_PROVIDER_SITE_OTHER): Payer: Medicare PPO | Admitting: Family Medicine

## 2012-08-16 ENCOUNTER — Encounter: Payer: Self-pay | Admitting: Family Medicine

## 2012-08-16 VITALS — BP 130/84 | HR 80 | Resp 16 | Wt 202.0 lb

## 2012-08-16 DIAGNOSIS — G309 Alzheimer's disease, unspecified: Secondary | ICD-10-CM

## 2012-08-16 DIAGNOSIS — E785 Hyperlipidemia, unspecified: Secondary | ICD-10-CM

## 2012-08-16 DIAGNOSIS — F411 Generalized anxiety disorder: Secondary | ICD-10-CM

## 2012-08-16 DIAGNOSIS — F0280 Dementia in other diseases classified elsewhere without behavioral disturbance: Secondary | ICD-10-CM

## 2012-08-16 DIAGNOSIS — M79672 Pain in left foot: Secondary | ICD-10-CM

## 2012-08-16 DIAGNOSIS — M1990 Unspecified osteoarthritis, unspecified site: Secondary | ICD-10-CM

## 2012-08-16 DIAGNOSIS — K219 Gastro-esophageal reflux disease without esophagitis: Secondary | ICD-10-CM

## 2012-08-16 DIAGNOSIS — M79609 Pain in unspecified limb: Secondary | ICD-10-CM

## 2012-08-16 DIAGNOSIS — J45909 Unspecified asthma, uncomplicated: Secondary | ICD-10-CM

## 2012-08-16 DIAGNOSIS — I10 Essential (primary) hypertension: Secondary | ICD-10-CM

## 2012-08-16 MED ORDER — KETOROLAC TROMETHAMINE 60 MG/2ML IJ SOLN
60.0000 mg | Freq: Once | INTRAMUSCULAR | Status: AC
  Administered 2012-08-16: 60 mg via INTRAMUSCULAR

## 2012-08-16 MED ORDER — NAPROXEN 500 MG PO TABS: 500.0000 mg | ORAL_TABLET | Freq: Two times a day (BID) | ORAL | Status: DC

## 2012-08-16 NOTE — Patient Instructions (Addendum)
Annual wellness in 4 month, please call if you need me before  Toradol 60mg  IM today for left foot pain, also naproxen sent to pharmacy, start one twice daily for 10 days tomorrow   Uric acid andchem 7 today   No changes in regular meds at this time

## 2012-08-17 ENCOUNTER — Telehealth: Payer: Self-pay | Admitting: Family Medicine

## 2012-08-17 LAB — URIC ACID: Uric Acid, Serum: 3.4 mg/dL (ref 2.4–7.0)

## 2012-08-17 LAB — BASIC METABOLIC PANEL
CO2: 30 mEq/L (ref 19–32)
Calcium: 9.5 mg/dL (ref 8.4–10.5)
Potassium: 4.7 mEq/L (ref 3.5–5.3)
Sodium: 139 mEq/L (ref 135–145)

## 2012-08-21 ENCOUNTER — Telehealth: Payer: Self-pay | Admitting: Family Medicine

## 2012-08-22 ENCOUNTER — Telehealth: Payer: Self-pay | Admitting: Family Medicine

## 2012-08-22 ENCOUNTER — Other Ambulatory Visit: Payer: Self-pay

## 2012-08-22 DIAGNOSIS — F0280 Dementia in other diseases classified elsewhere without behavioral disturbance: Secondary | ICD-10-CM

## 2012-08-22 MED ORDER — DONEPEZIL HCL 10 MG PO TABS: 10.0000 mg | ORAL_TABLET | Freq: Every day | ORAL | Status: DC

## 2012-08-22 MED ORDER — FUROSEMIDE 20 MG PO TABS: ORAL_TABLET | ORAL | Status: DC

## 2012-08-22 NOTE — Telephone Encounter (Signed)
Noted and refilled.

## 2012-08-22 NOTE — Telephone Encounter (Signed)
Called and left message for son alerting him that aricept and lasix have been refilled and that the Calcium is OTC.

## 2012-08-24 ENCOUNTER — Telehealth: Payer: Self-pay | Admitting: Family Medicine

## 2012-08-24 MED ORDER — POTASSIUM CHLORIDE CRYS ER 20 MEQ PO TBCR: EXTENDED_RELEASE_TABLET | ORAL | Status: DC

## 2012-08-24 MED ORDER — AMLODIPINE BESYLATE 5 MG PO TABS: ORAL_TABLET | ORAL | Status: DC

## 2012-08-24 NOTE — Telephone Encounter (Signed)
Normal letter sent

## 2012-08-24 NOTE — Telephone Encounter (Signed)
meds refilled 

## 2012-08-27 ENCOUNTER — Other Ambulatory Visit: Payer: Self-pay | Admitting: Family Medicine

## 2012-09-03 NOTE — Assessment & Plan Note (Signed)
Uncontrolled , anti inflammatories iM and orally for short period

## 2012-09-03 NOTE — Assessment & Plan Note (Signed)
Controlled, no change in medication  

## 2012-09-03 NOTE — Progress Notes (Signed)
  Subjective:    Patient ID: [name], female    DOB: 01-02-1935, 78 y.o.   MRN: [id number]  HPI The PT is here for follow up and re-evaluation of chronic medical conditions, medication management and review of any available recent lab and radiology data.  Preventive health is updated, specifically  Cancer screening and Immunization.   . The PT denies any adverse reactions to current medications since the last visit.  There are no new concerns.  3 week h/o left foot pain, no injury aggravating he symptom Doing well mentally on current regime for anxiety , depression an dementia, per son's repport    Review of Systems See HPI Denies recent fever or chills. Denies sinus pressure, nasal congestion, ear pain or sore throat. Denies chest congestion, productive cough or wheezing. Denies chest pains, palpitations and leg swelling Denies abdominal pain, nausea, vomiting,diarrhea or constipation.   Denies dysuria, frequency, hesitancy or incontinence. Chronic  joint pain, and limitation in mobility. Denies headaches, seizures, numbness, or tingling. Denies uncontrolled depression, anxiety or insomnia. Denies skin break down or rash.        Objective:   Physical Exam  Patient alert and oriented and in no cardiopulmonary distress.  HEENT: No facial asymmetry, EOMI, no sinus tenderness,  oropharynx pink and moist.  Neck supple no adenopathy.  Chest: Clear to auscultation bilaterally.  CVS: S1, S2 no murmurs, no S3.  ABD: Soft non tender. Bowel sounds normal.  Ext: No edema  MS: decreased ROM spine, shoulders, hips and knees.Tender over left foot, which is not eythematous , no skin breakdown  noted  Skin: Intact, no ulcerations or rash noted.  Psych: Good eye contact, normal affect. Memory impaired not anxious or depressed appearing.  CNS: CN 2-12 intact, power, tone and sensation normal throughout.       Assessment & Plan:

## 2012-09-03 NOTE — Assessment & Plan Note (Deleted)
Hyperlipidemia:Low fat diet discussed and encouraged.  Controlled on medication which pt does not have with her today

## 2012-09-03 NOTE — Assessment & Plan Note (Signed)
Controlled, no change in medication DASH diet and commitment to daily physical activity for a minimum of 30 minutes discussed and encouraged, as a part of hypertension management. The importance of attaining a healthy weight is also discussed.  

## 2012-09-03 NOTE — Assessment & Plan Note (Signed)
Flare with uncontrolled leg pain, toradol in office to be followed by short course of anti inflammatory

## 2012-09-03 NOTE — Assessment & Plan Note (Signed)
Stable on medication which she is tolerating well

## 2012-09-05 ENCOUNTER — Other Ambulatory Visit: Payer: Self-pay | Admitting: Family Medicine

## 2012-09-10 ENCOUNTER — Other Ambulatory Visit: Payer: Self-pay | Admitting: Family Medicine

## 2012-09-21 ENCOUNTER — Other Ambulatory Visit: Payer: Self-pay | Admitting: Family Medicine

## 2012-09-22 ENCOUNTER — Telehealth: Payer: Self-pay | Admitting: Family Medicine

## 2012-09-22 NOTE — Telephone Encounter (Signed)
This has already been faxed in

## 2012-10-02 ENCOUNTER — Encounter: Payer: Self-pay | Admitting: Family Medicine

## 2012-10-02 ENCOUNTER — Ambulatory Visit (INDEPENDENT_AMBULATORY_CARE_PROVIDER_SITE_OTHER): Payer: Medicare PPO | Admitting: Family Medicine

## 2012-10-02 VITALS — BP 152/90 | HR 100 | Resp 20 | Ht 63.0 in | Wt 195.1 lb

## 2012-10-02 DIAGNOSIS — F329 Major depressive disorder, single episode, unspecified: Secondary | ICD-10-CM

## 2012-10-02 DIAGNOSIS — F0280 Dementia in other diseases classified elsewhere without behavioral disturbance: Secondary | ICD-10-CM

## 2012-10-02 DIAGNOSIS — J45909 Unspecified asthma, uncomplicated: Secondary | ICD-10-CM

## 2012-10-02 DIAGNOSIS — J189 Pneumonia, unspecified organism: Secondary | ICD-10-CM

## 2012-10-02 DIAGNOSIS — I10 Essential (primary) hypertension: Secondary | ICD-10-CM

## 2012-10-02 DIAGNOSIS — J309 Allergic rhinitis, unspecified: Secondary | ICD-10-CM | POA: Insufficient documentation

## 2012-10-02 MED ORDER — LORATADINE 10 MG PO TABS: 10.0000 mg | ORAL_TABLET | Freq: Every day | ORAL | Status: DC

## 2012-10-02 MED ORDER — FLUTICASONE PROPIONATE 50 MCG/ACT NA SUSP: 1.0000 | Freq: Every day | NASAL | Status: DC

## 2012-10-02 MED ORDER — PSEUDOEPHEDRINE HCL 30 MG PO TABS: ORAL_TABLET | ORAL | Status: AC

## 2012-10-02 NOTE — Patient Instructions (Addendum)
F/u in 2 month, please call if you need me before  Please continue and complete all the antibiotic prescribed at [facility].   We will get xray and labs from [facility], and sudafed are prescribed for your allergies.
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
## 2012-10-07 ENCOUNTER — Other Ambulatory Visit: Payer: Self-pay | Admitting: Family Medicine

## 2012-10-09 ENCOUNTER — Other Ambulatory Visit: Payer: Self-pay | Admitting: Family Medicine

## 2012-10-12 ENCOUNTER — Telehealth: Payer: Self-pay | Admitting: Family Medicine

## 2012-10-12 NOTE — Telephone Encounter (Signed)
Has already been sent

## 2012-10-15 ENCOUNTER — Other Ambulatory Visit: Payer: Self-pay | Admitting: Family Medicine

## 2012-10-16 DIAGNOSIS — J189 Pneumonia, unspecified organism: Secondary | ICD-10-CM | POA: Insufficient documentation

## 2012-10-16 NOTE — Assessment & Plan Note (Signed)
Recent diagnosis at [facility] ED , pt has minimal symptoms and no signs. She is advised to complete antibiotic course

## 2012-10-16 NOTE — Assessment & Plan Note (Signed)
Continue current medoication

## 2012-10-16 NOTE — Assessment & Plan Note (Signed)
Controlled, no change in medication  

## 2012-10-16 NOTE — Assessment & Plan Note (Signed)
Uncontrolled pt to start daily medication
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
## 2012-10-23 ENCOUNTER — Telehealth: Payer: Self-pay | Admitting: Family Medicine

## 2012-10-23 MED ORDER — ALBUTEROL SULFATE (2.5 MG/3ML) 0.083% IN NEBU: 2.5000 mg | INHALATION_SOLUTION | Freq: Four times a day (QID) | RESPIRATORY_TRACT | Status: DC | PRN

## 2012-10-23 NOTE — Telephone Encounter (Signed)
Med refilled.

## 2012-11-06 ENCOUNTER — Other Ambulatory Visit: Payer: Self-pay

## 2012-11-06 MED ORDER — PANTOPRAZOLE SODIUM 40 MG PO TBEC: DELAYED_RELEASE_TABLET | ORAL | Status: DC

## 2012-11-08 ENCOUNTER — Encounter: Payer: Self-pay | Admitting: Family Medicine

## 2012-11-08 ENCOUNTER — Ambulatory Visit (INDEPENDENT_AMBULATORY_CARE_PROVIDER_SITE_OTHER): Payer: Medicare PPO | Admitting: Family Medicine

## 2012-11-08 VITALS — BP 138/80 | HR 96 | Resp 16 | Wt 202.1 lb

## 2012-11-08 DIAGNOSIS — R5383 Other fatigue: Secondary | ICD-10-CM

## 2012-11-08 DIAGNOSIS — F329 Major depressive disorder, single episode, unspecified: Secondary | ICD-10-CM

## 2012-11-08 DIAGNOSIS — M25511 Pain in right shoulder: Secondary | ICD-10-CM

## 2012-11-08 DIAGNOSIS — M25519 Pain in unspecified shoulder: Secondary | ICD-10-CM

## 2012-11-08 DIAGNOSIS — K219 Gastro-esophageal reflux disease without esophagitis: Secondary | ICD-10-CM

## 2012-11-08 DIAGNOSIS — F411 Generalized anxiety disorder: Secondary | ICD-10-CM

## 2012-11-08 DIAGNOSIS — Z5189 Encounter for other specified aftercare: Secondary | ICD-10-CM

## 2012-11-08 DIAGNOSIS — Z79899 Other long term (current) drug therapy: Secondary | ICD-10-CM

## 2012-11-08 DIAGNOSIS — M1990 Unspecified osteoarthritis, unspecified site: Secondary | ICD-10-CM

## 2012-11-08 DIAGNOSIS — J45909 Unspecified asthma, uncomplicated: Secondary | ICD-10-CM

## 2012-11-08 DIAGNOSIS — T7691XD Unspecified adult maltreatment, suspected, subsequent encounter: Secondary | ICD-10-CM

## 2012-11-08 DIAGNOSIS — R5381 Other malaise: Secondary | ICD-10-CM

## 2012-11-08 DIAGNOSIS — I10 Essential (primary) hypertension: Secondary | ICD-10-CM

## 2012-11-08 LAB — CBC WITH DIFFERENTIAL/PLATELET
HCT: 37.8 % (ref 36.0–46.0)
Hemoglobin: 12.2 g/dL (ref 12.0–15.0)
Lymphocytes Relative: 36 % (ref 12–46)
MCHC: 32.3 g/dL (ref 30.0–36.0)
Monocytes Absolute: 0.6 10*3/uL (ref 0.1–1.0)
Monocytes Relative: 9 % (ref 3–12)
Neutro Abs: 3.4 10*3/uL (ref 1.7–7.7)

## 2012-11-08 MED ORDER — CELECOXIB 200 MG PO CAPS: 200.0000 mg | ORAL_CAPSULE | Freq: Every day | ORAL | Status: DC

## 2012-11-08 MED ORDER — OMEPRAZOLE 40 MG PO CPDR: 40.0000 mg | DELAYED_RELEASE_CAPSULE | Freq: Every day | ORAL | Status: DC
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
## 2012-11-08 NOTE — Patient Instructions (Addendum)
F/u in 2 month, please call if you need me before  Med changes as discussed.  Current list is medication that you will take  You will be referred for evaluation for a power wheelchair/scooter  For home use, evaluation is at [facility] today, cbc, lipid, chem 7, and TSH  You are referred to Dr Loralee Pacas for right shoulder pain  You will get a handicap sticker to be used by person driving you   New medication is celebrex one daily for your arthritis  We will request re evaluation for need for oxygen ayt night before discontinuing this

## 2012-11-09 LAB — LIPID PANEL
HDL: 42 mg/dL (ref >39)
LDL Cholesterol: 106 mg/dL — ABNORMAL HIGH (ref 0–99)
Triglycerides: 182 mg/dL — ABNORMAL HIGH (ref <150)
VLDL: 36 mg/dL (ref 0–40)

## 2012-11-09 LAB — BASIC METABOLIC PANEL
Calcium: 9.4 mg/dL (ref 8.4–10.5)
Creat: 0.73 mg/dL (ref 0.50–1.10)

## 2012-11-11 DIAGNOSIS — T7691XA Unspecified adult maltreatment, suspected, initial encounter: Secondary | ICD-10-CM | POA: Insufficient documentation

## 2012-11-11 NOTE — Assessment & Plan Note (Signed)
Controlled, no change in medication  
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
## 2012-11-11 NOTE — Assessment & Plan Note (Signed)
Controlled, med changed due to availability based on formulary

## 2012-11-11 NOTE — Assessment & Plan Note (Signed)
Mildly increased with recent emotional stress

## 2012-11-11 NOTE — Assessment & Plan Note (Signed)
Generalized, depends on walker fo assistance Change to celebrex, add PPI for GI protection

## 2012-11-11 NOTE — Assessment & Plan Note (Signed)
Increased with reduced ROM refer to ortho for eval and treatment
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
## 2012-11-11 NOTE — Assessment & Plan Note (Signed)
Controlled, no change in medication DASH diet and commitment to daily physical activity for a minimum of 30 minutes discussed and encouraged, as a part of hypertension management. The importance of attaining a healthy weight is also discussed.  

## 2012-11-17 ENCOUNTER — Telehealth: Payer: Self-pay | Admitting: Family Medicine

## 2012-11-19 ENCOUNTER — Other Ambulatory Visit: Payer: Self-pay | Admitting: Family Medicine

## 2012-11-20 NOTE — Telephone Encounter (Signed)
pls contact pt ask her to name the person who accompanied her to last visit, explain , we need signed consent on record for [place] to discuss medical matters about her, if not yet on record, let this person know, if she is the same one who came last time, that this needs to be done before any discussion can be had about pt. Let caller know this is the case/policy and discussions need to be had at [facility] preferably when the situation can be appropriately addressed

## 2012-11-26 ENCOUNTER — Other Ambulatory Visit: Payer: Self-pay | Admitting: Family Medicine

## 2012-12-01 ENCOUNTER — Telehealth: Payer: Self-pay | Admitting: Family Medicine

## 2012-12-04 ENCOUNTER — Ambulatory Visit: Payer: Medicare PPO | Admitting: Family Medicine

## 2012-12-04 NOTE — Telephone Encounter (Signed)
Likely getting accustomed to new environment. Review good sleep hygiene with her. She is already on a lot of prescription  Medication, since "new " complaint , I believe it is triggered by new living situation  [facility] also take tylenol pM one at night , this will hel[p, plan to stop in the next 2 to 3 weeks

## 2012-12-04 NOTE — Telephone Encounter (Signed)
Having problems staying asleep- sleeps for only a couple hours, then she is back awake again. Has been going on for about a month.

## 2012-12-06 ENCOUNTER — Telehealth: Payer: Self-pay | Admitting: Family Medicine

## 2012-12-06 NOTE — Telephone Encounter (Signed)
noted 

## 2012-12-06 NOTE — Telephone Encounter (Signed)
Noted  

## 2012-12-08 ENCOUNTER — Other Ambulatory Visit: Payer: Self-pay | Admitting: Family Medicine

## 2012-12-08 NOTE — Telephone Encounter (Signed)
Called pt left message to call back
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
## 2012-12-13 ENCOUNTER — Ambulatory Visit (HOSPITAL_COMMUNITY): Payer: Medicare PPO | Admitting: Physical Therapy

## 2012-12-13 ENCOUNTER — Other Ambulatory Visit: Payer: Self-pay

## 2012-12-13 ENCOUNTER — Other Ambulatory Visit: Payer: Self-pay | Admitting: Family Medicine

## 2012-12-13 MED ORDER — FLUTICASONE-SALMETEROL 100-50 MCG/DOSE IN AEPB: INHALATION_SPRAY | RESPIRATORY_TRACT | Status: DC

## 2012-12-22 ENCOUNTER — Telehealth (HOSPITAL_COMMUNITY): Payer: Self-pay

## 2012-12-22 ENCOUNTER — Other Ambulatory Visit: Payer: Self-pay | Admitting: Family Medicine

## 2012-12-27 ENCOUNTER — Ambulatory Visit (HOSPITAL_COMMUNITY): Payer: Medicare PPO | Admitting: Physical Therapy

## 2013-01-03 ENCOUNTER — Ambulatory Visit (HOSPITAL_COMMUNITY): Payer: Medicare PPO

## 2013-01-03 ENCOUNTER — Encounter (HOSPITAL_COMMUNITY): Payer: Medicare PPO

## 2013-01-04 NOTE — Telephone Encounter (Signed)
Unable to reach patient. Appointment noted for 8/18/

## 2013-01-08 ENCOUNTER — Ambulatory Visit: Payer: Medicare PPO | Admitting: Family Medicine

## 2013-01-10 ENCOUNTER — Ambulatory Visit: Payer: Medicare PPO | Admitting: Family Medicine

## 2013-01-10 ENCOUNTER — Encounter: Payer: Self-pay | Admitting: Family Medicine

## 2013-01-16 ENCOUNTER — Ambulatory Visit: Payer: Medicare PPO | Admitting: Family Medicine

## 2013-01-17 ENCOUNTER — Other Ambulatory Visit: Payer: Self-pay

## 2013-01-17 ENCOUNTER — Ambulatory Visit (HOSPITAL_COMMUNITY)
Admission: RE | Admit: 2013-01-17 | Discharge: 2013-01-17 | Disposition: A | Discharge status: Home / Self Care | Payer: Medicare PPO | Source: Ambulatory Visit | Attending: Family Medicine | Admitting: Family Medicine

## 2013-01-17 ENCOUNTER — Other Ambulatory Visit: Payer: Self-pay | Admitting: Family Medicine

## 2013-01-17 DIAGNOSIS — R928 Other abnormal and inconclusive findings on diagnostic imaging of breast: Secondary | ICD-10-CM

## 2013-01-17 DIAGNOSIS — Z09 Encounter for follow-up examination after completed treatment for conditions other than malignant neoplasm: Secondary | ICD-10-CM | POA: Insufficient documentation

## 2013-01-17 MED ORDER — LORATADINE 10 MG PO TABS: ORAL_TABLET | ORAL | Status: DC

## 2013-01-17 MED ORDER — FUROSEMIDE 20 MG PO TABS: ORAL_TABLET | ORAL | Status: DC

## 2013-01-17 MED ORDER — POTASSIUM CHLORIDE CRYS ER 20 MEQ PO TBCR: EXTENDED_RELEASE_TABLET | ORAL | Status: DC

## 2013-01-17 MED ORDER — AMLODIPINE BESYLATE 5 MG PO TABS: ORAL_TABLET | ORAL | Status: DC

## 2013-01-17 MED ORDER — QUETIAPINE FUMARATE 25 MG PO TABS: ORAL_TABLET | ORAL | Status: DC

## 2013-02-02 ENCOUNTER — Ambulatory Visit: Payer: Medicare PPO | Admitting: Family Medicine

## 2013-02-08 ENCOUNTER — Ambulatory Visit (INDEPENDENT_AMBULATORY_CARE_PROVIDER_SITE_OTHER): Payer: Medicare PPO | Admitting: Family Medicine

## 2013-02-08 ENCOUNTER — Encounter: Payer: Self-pay | Admitting: Family Medicine

## 2013-02-08 ENCOUNTER — Other Ambulatory Visit: Payer: Self-pay | Admitting: Family Medicine

## 2013-02-08 VITALS — BP 124/74 | HR 58 | Resp 18 | Ht 63.0 in | Wt 201.1 lb

## 2013-02-08 DIAGNOSIS — F0280 Dementia in other diseases classified elsewhere without behavioral disturbance: Secondary | ICD-10-CM

## 2013-02-08 DIAGNOSIS — Z8489 Family history of other specified conditions: Secondary | ICD-10-CM | POA: Insufficient documentation

## 2013-02-08 DIAGNOSIS — F32A Depression, unspecified: Secondary | ICD-10-CM | POA: Insufficient documentation

## 2013-02-08 DIAGNOSIS — Z23 Encounter for immunization: Secondary | ICD-10-CM

## 2013-02-08 DIAGNOSIS — T7691XD Unspecified adult maltreatment, suspected, subsequent encounter: Secondary | ICD-10-CM

## 2013-02-08 DIAGNOSIS — M1990 Unspecified osteoarthritis, unspecified site: Secondary | ICD-10-CM

## 2013-02-08 DIAGNOSIS — F329 Major depressive disorder, single episode, unspecified: Secondary | ICD-10-CM | POA: Insufficient documentation

## 2013-02-08 DIAGNOSIS — I10 Essential (primary) hypertension: Secondary | ICD-10-CM

## 2013-02-08 DIAGNOSIS — Z5189 Encounter for other specified aftercare: Secondary | ICD-10-CM

## 2013-02-08 DIAGNOSIS — J45909 Unspecified asthma, uncomplicated: Secondary | ICD-10-CM

## 2013-02-08 DIAGNOSIS — M549 Dorsalgia, unspecified: Secondary | ICD-10-CM

## 2013-02-08 MED ORDER — KETOROLAC TROMETHAMINE 60 MG/2ML IM SOLN
60.0000 mg | Freq: Once | INTRAMUSCULAR | Status: AC
  Administered 2013-02-08: 60 mg via INTRAMUSCULAR

## 2013-02-08 MED ORDER — DONEPEZIL HCL 10 MG PO TABS: 10.0000 mg | ORAL_TABLET | Freq: Every day | ORAL | Status: DC

## 2013-02-08 MED ORDER — CITALOPRAM HYDROBROMIDE 10 MG PO TABS: 10.0000 mg | ORAL_TABLET | Freq: Every day | ORAL | Status: DC

## 2013-02-08 MED ORDER — ALPRAZOLAM 0.5 MG PO TABS: ORAL_TABLET | ORAL | Status: DC

## 2013-02-08 MED ORDER — POTASSIUM CHLORIDE CRYS ER 20 MEQ PO TBCR: EXTENDED_RELEASE_TABLET | ORAL | Status: DC

## 2013-02-08 MED ORDER — AMLODIPINE BESYLATE 5 MG PO TABS: ORAL_TABLET | ORAL | Status: DC

## 2013-02-08 MED ORDER — FUROSEMIDE 20 MG PO TABS: ORAL_TABLET | ORAL | Status: DC

## 2013-02-08 MED ORDER — TRAMADOL HCL 50 MG PO TABS: ORAL_TABLET | ORAL | Status: DC

## 2013-02-08 NOTE — Patient Instructions (Addendum)
F/u in 2 month,with rectal, call if you need me before  You are being referred to [facility] for social worker to assess situation and sort your living situation out.   Stop pepcid and loratidine , you do not need these and we have taken them away  Start citalopram one every day again for depression and anxiety  New is tramadol 50mg  one tablet at bedtime for back and left hip pain  Toradol 60mg  Im in the office today for pain  Flu vaccine administered today
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
## 2013-02-11 NOTE — Assessment & Plan Note (Signed)
Chronic and unchanged, celebrex and tramadol for control of pain. Cautioned re fall risk
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
## 2013-02-11 NOTE — Assessment & Plan Note (Signed)
Controlled, no change in medication  

## 2013-02-11 NOTE — Assessment & Plan Note (Signed)
Not suicidal or homicidal, resume medication

## 2013-03-08 ENCOUNTER — Encounter: Payer: Self-pay | Admitting: Family Medicine

## 2013-03-08 ENCOUNTER — Ambulatory Visit (INDEPENDENT_AMBULATORY_CARE_PROVIDER_SITE_OTHER): Payer: Medicare PPO | Admitting: Family Medicine

## 2013-03-08 VITALS — BP 128/74 | HR 93 | Resp 20 | Ht 63.0 in | Wt 206.1 lb

## 2013-03-08 DIAGNOSIS — Z5329 Procedure and treatment not carried out because of patient's decision for other reasons: Secondary | ICD-10-CM

## 2013-03-08 DIAGNOSIS — L0390 Cellulitis, unspecified: Secondary | ICD-10-CM | POA: Insufficient documentation

## 2013-03-08 DIAGNOSIS — R197 Diarrhea, unspecified: Secondary | ICD-10-CM | POA: Insufficient documentation

## 2013-03-08 DIAGNOSIS — Y92009 Unspecified place in unspecified non-institutional (private) residence as the place of occurrence of the external cause: Secondary | ICD-10-CM

## 2013-03-08 DIAGNOSIS — F329 Major depressive disorder, single episode, unspecified: Secondary | ICD-10-CM

## 2013-03-08 DIAGNOSIS — W19XXXA Unspecified fall, initial encounter: Secondary | ICD-10-CM

## 2013-03-08 DIAGNOSIS — I10 Essential (primary) hypertension: Secondary | ICD-10-CM

## 2013-03-08 DIAGNOSIS — Z5320 Procedure and treatment not carried out because of patient's decision for unspecified reasons: Secondary | ICD-10-CM

## 2013-03-08 DIAGNOSIS — M1990 Unspecified osteoarthritis, unspecified site: Secondary | ICD-10-CM

## 2013-03-08 DIAGNOSIS — F411 Generalized anxiety disorder: Secondary | ICD-10-CM

## 2013-03-08 DIAGNOSIS — J45909 Unspecified asthma, uncomplicated: Secondary | ICD-10-CM

## 2013-03-08 DIAGNOSIS — R5381 Other malaise: Secondary | ICD-10-CM

## 2013-03-08 DIAGNOSIS — L0291 Cutaneous abscess, unspecified: Secondary | ICD-10-CM

## 2013-03-08 MED ORDER — DOXYCYCLINE HYCLATE 100 MG PO TABS: 100.0000 mg | ORAL_TABLET | Freq: Two times a day (BID) | ORAL | Status: DC

## 2013-03-08 MED ORDER — DIPHENOXYLATE-ATROPINE 2.5-0.025 MG PO TABS: ORAL_TABLET | ORAL | Status: DC

## 2013-03-08 NOTE — Progress Notes (Signed)
  Subjective:    Patient ID: [name], female    DOB: [birth year], 78 y.o.   MRN: [id number]  HPI The PT is here for follow up of acute gastroenteritis, last Thursday, hospitalized at [facility] Saturday, had both vomit and diarrheah, no other family was similarly affected This has cleared [name] off her deck last week Tuesday, since then the legs have looked red and swollen, espescially left leg , her case worker asked for a sooner appt re leg redness and swelling. Pt states she is extremely happy with current situation throughj [facility], she has someone coming in twice weekly to help with bathing her and taking her to grocery store. She also states the grand son who lives with her and his Mom remain attentive, Mom unable to help, lives elsewhere and is disabled      Review of Systems See HPI Denies recent fever or chills. Denies sinus pressure, nasal congestion, ear pain or sore throat. Denies chest congestion, productive cough or wheezing. Denies chest pains, palpitations and leg swelling C/o chronic diarrheah.   Chronic  incontinence. Chronic  joint pain,  and limitation in mobility. Denies headaches, seizures, numbness, or tingling. Denies depression, anxiety or insomnia.         Objective:   Physical Exam Patient alert and oriented and in no cardiopulmonary distress.  HEENT: No facial asymmetry, EOMI, no sinus tenderness,  oropharynx pink and moist.  Neck decreased though adequate  no adenopathy.  Chest: Clear to auscultation bilaterally.Decreased air entry throughout  CVS: S1, S2 no murmurs, no S3.  ABD: Soft non tender. Bowel sounds normal.  Ext:one plus edema  MS: decreased  ROM spine, shoulders, hips and knees.  Skin: erythema of anterior aspect of both legs,left more so than right  no skin breakdown  Psych: Good eye contact, normal affect. Memory intact not anxious or depressed appearing.  CNS: CN 2-12 intact, power,  normal throughout.        Assessment  & Plan:

## 2013-03-08 NOTE — Patient Instructions (Signed)
F/u Nov 19, cancel 10/20 appt  You are  Treated for cellulitis of the legs, doxycycline is sent in for 10 days  New medication lomotil is sent in for use every 3 days for uncontrolled diareah

## 2013-03-10 DIAGNOSIS — Z5329 Procedure and treatment not carried out because of patient's decision for other reasons: Secondary | ICD-10-CM | POA: Insufficient documentation

## 2013-03-10 DIAGNOSIS — W19XXXA Unspecified fall, initial encounter: Secondary | ICD-10-CM | POA: Insufficient documentation

## 2013-03-10 NOTE — Assessment & Plan Note (Signed)
Recent fall off her deck, has been able to weight bear and mobility not affected. Noted erythema of legs only, which she suffers from chronically , but slightly increased, no skin breakdown Home safety with ambulation discussed. States new deck has already been built

## 2013-03-10 NOTE — Assessment & Plan Note (Signed)
Controlled, no change in medication  

## 2013-03-10 NOTE — Assessment & Plan Note (Signed)
Unchanged, continue current meds 

## 2013-03-10 NOTE — Assessment & Plan Note (Signed)
Acute infection following recent fall , antibiotic prescribed

## 2013-03-10 NOTE — Assessment & Plan Note (Signed)
Improved with the involvement of [facility] and appropriate in home care arrangement

## 2013-03-10 NOTE — Assessment & Plan Note (Signed)
Chronic, states everything she eats goes through her, lomotil every 3 days if needed only, cautioned re possibility of constipation if over used

## 2013-03-10 NOTE — Assessment & Plan Note (Signed)
Currently refusing colonoscopy, will continue to address this

## 2013-03-12 ENCOUNTER — Ambulatory Visit: Payer: Medicare PPO | Admitting: Family Medicine

## 2013-03-20 ENCOUNTER — Other Ambulatory Visit: Payer: Self-pay | Admitting: Family Medicine

## 2013-03-27 ENCOUNTER — Other Ambulatory Visit: Payer: Self-pay | Admitting: Family Medicine

## 2013-04-04 ENCOUNTER — Other Ambulatory Visit: Payer: Self-pay | Admitting: Family Medicine

## 2013-04-04 ENCOUNTER — Telehealth: Payer: Self-pay | Admitting: Family Medicine

## 2013-04-04 DIAGNOSIS — R251 Tremor, unspecified: Secondary | ICD-10-CM

## 2013-04-04 NOTE — Telephone Encounter (Signed)
Recommends neuro evaluation of tremor, will refer to Dr Gerilyn Pilgrim, pls let her know, also the patient Thanks

## 2013-04-04 NOTE — Telephone Encounter (Signed)
Patient aware and already has appt set up with [facility]

## 2013-04-11 ENCOUNTER — Encounter (INDEPENDENT_AMBULATORY_CARE_PROVIDER_SITE_OTHER): Payer: Self-pay

## 2013-04-11 ENCOUNTER — Ambulatory Visit (INDEPENDENT_AMBULATORY_CARE_PROVIDER_SITE_OTHER): Payer: Medicare PPO | Admitting: Family Medicine

## 2013-04-11 ENCOUNTER — Encounter: Payer: Self-pay | Admitting: Family Medicine

## 2013-04-11 VITALS — BP 122/78 | HR 101 | Resp 16 | Ht 63.0 in | Wt 199.1 lb

## 2013-04-11 DIAGNOSIS — R259 Unspecified abnormal involuntary movements: Secondary | ICD-10-CM

## 2013-04-11 DIAGNOSIS — I10 Essential (primary) hypertension: Secondary | ICD-10-CM

## 2013-04-11 DIAGNOSIS — B369 Superficial mycosis, unspecified: Secondary | ICD-10-CM

## 2013-04-11 DIAGNOSIS — R197 Diarrhea, unspecified: Secondary | ICD-10-CM

## 2013-04-11 DIAGNOSIS — F0280 Dementia in other diseases classified elsewhere without behavioral disturbance: Secondary | ICD-10-CM

## 2013-04-11 DIAGNOSIS — M1990 Unspecified osteoarthritis, unspecified site: Secondary | ICD-10-CM

## 2013-04-11 DIAGNOSIS — Z8701 Personal history of pneumonia (recurrent): Secondary | ICD-10-CM

## 2013-04-11 DIAGNOSIS — Z87898 Personal history of other specified conditions: Secondary | ICD-10-CM

## 2013-04-11 DIAGNOSIS — L290 Pruritus ani: Secondary | ICD-10-CM

## 2013-04-11 DIAGNOSIS — K219 Gastro-esophageal reflux disease without esophagitis: Secondary | ICD-10-CM

## 2013-04-11 DIAGNOSIS — Z1211 Encounter for screening for malignant neoplasm of colon: Secondary | ICD-10-CM

## 2013-04-11 DIAGNOSIS — R251 Tremor, unspecified: Secondary | ICD-10-CM

## 2013-04-11 DIAGNOSIS — Z9189 Other specified personal risk factors, not elsewhere classified: Secondary | ICD-10-CM

## 2013-04-11 DIAGNOSIS — J45909 Unspecified asthma, uncomplicated: Secondary | ICD-10-CM

## 2013-04-11 DIAGNOSIS — F329 Major depressive disorder, single episode, unspecified: Secondary | ICD-10-CM

## 2013-04-11 MED ORDER — CITALOPRAM HYDROBROMIDE 20 MG PO TABS: 20.0000 mg | ORAL_TABLET | Freq: Every day | ORAL | Status: DC

## 2013-04-11 MED ORDER — CLOTRIMAZOLE-BETAMETHASONE 1-0.05 % EX CREA: 1.0000 "application " | TOPICAL_CREAM | Freq: Two times a day (BID) | CUTANEOUS | Status: DC

## 2013-04-11 MED ORDER — FLUCONAZOLE 150 MG PO TABS: ORAL_TABLET | ORAL | Status: AC

## 2013-04-11 MED ORDER — DERMAGRAN BC EX CREA: TOPICAL_CREAM | CUTANEOUS | Status: AC | PRN

## 2013-04-11 NOTE — Patient Instructions (Addendum)
F/u in 2.5 month, call if you need me before  Increase in the dose of celexa to help with depression, hopefully you will feel better even though it is the holiday season  We will see if you can become involved in a senior's group even once  Per week, we will contact your case worker.  We are checking into cost of neurology evaluation and will get back with any alternatives, also make case worker aware  For rash on the bottom, a big problem is some incontinence, (stool spilling) so try and keep clean and dry, and a paste to be applied daily is prescribed along with other medication  Rectal exam today

## 2013-04-11 NOTE — Progress Notes (Signed)
  Subjective:    Patient ID: [name], female    DOB: August 10, 1934, 78 y.o.   MRN: [id number]  HPI The PT is here for follow up and re-evaluation of chronic medical conditions, medication management and review of any available recent lab and radiology data.  Preventive health is updated, specifically  Cancer screening and Immunization.   Questions or concerns regarding consultations or procedures which the PT has had in the interim are  Addressed.Still needs to see neurology, states finances are a a problem The PT denies any adverse reactions to current medications since the last visit.  concerns from her caseworker as far as control of her depression and arthritis. Pt c/o itching and soreness in rectal area all the time wilt soiling      Review of Systems See HPI Denies recent fever or chills. Denies sinus pressure, nasal congestion, ear pain or sore throat. Denies chest congestion, productive cough or wheezing. Denies chest pains, palpitations and leg swelling Denies abdominal pain, nausea, vomiting,has chronic loose stools with soiling and perianal itch Denies dysuria, frequency, chronic  Incontinence.Uses supplies for this Chronic joint pain and limitation in mobulity , ambulates with assistance Denies headaches or  seizures, uncontrolled disabling tremor        Objective:   Physical Exam Patient alert and oriented and in no cardiopulmonary distress.  HEENT: No facial asymmetry, EOMI, no sinus tenderness,  oropharynx pink and moist.  Neck decreased though adequate ROM, no JVD, no adenopathy.  Chest: Clear to auscultation bilaterally.Decreased though adequate no crackles or wheezes.  CVS: S1, S2 no murmurs, no S3.  ABD: Soft non tender. Bowel sounds normal.Rectal  Perianal rash  Heme negative stool Ext: No edema  MS: Decreased  ROM spine, shoulders, hips and knees.  Skin: Intact, no ulcerations or rash noted.  Psych: Good eye contact, normal affect. Memory  mildly impaired,mildly  anxious not  depressed appearing.  CNS: CN 2-12 intact, power,  normal throughout.        Assessment & Plan:

## 2013-04-12 ENCOUNTER — Telehealth: Payer: Self-pay | Admitting: Family Medicine

## 2013-04-12 NOTE — Telephone Encounter (Signed)
Called and spoke with [name] and got permission to speak with her nurse, [name]. She was not there but I told [name] if she had any questions about what med was increased at visit to check on the discharge paper that was given at the OV and that should answer any questions

## 2013-04-17 ENCOUNTER — Other Ambulatory Visit: Payer: Self-pay | Admitting: Family Medicine

## 2013-05-02 ENCOUNTER — Other Ambulatory Visit: Payer: Self-pay | Admitting: Family Medicine

## 2013-05-03 ENCOUNTER — Telehealth: Payer: Self-pay

## 2013-05-03 NOTE — Telephone Encounter (Signed)
Pls explain to NP that celebrex IS an NSAID, it is a "SAFE" NSAID as far as GI upset is concerned, , medicare will  not cover the lidoderm patch , no changes , alos I would not be authorizing med changes made by h/m/her as I am not the "supervising MD" that they are working with , pls explain

## 2013-05-03 NOTE — Telephone Encounter (Signed)
Patient aware.

## 2013-05-05 ENCOUNTER — Other Ambulatory Visit: Payer: Self-pay | Admitting: Family Medicine

## 2013-05-20 DIAGNOSIS — R251 Tremor, unspecified: Secondary | ICD-10-CM | POA: Insufficient documentation

## 2013-05-20 NOTE — Assessment & Plan Note (Signed)
Controlled, no change in medication DASH diet and commitment to daily physical activity for a minimum of 30 minutes discussed and encouraged, as a part of hypertension management. The importance of attaining a healthy weight is also discussed.  

## 2013-05-20 NOTE — Assessment & Plan Note (Signed)
Fecal incontinence a real issue.  Pt education done, rectal exam shows heme negative stool

## 2013-05-20 NOTE — Assessment & Plan Note (Signed)
Stable on current medication.  

## 2013-05-20 NOTE — Assessment & Plan Note (Signed)
Stable and controlled on current meds, continue same 

## 2013-05-20 NOTE — Assessment & Plan Note (Signed)
Uncontrolled depression, pt not suicidal, homicidal  Or hallucinating. Refuses psych involvement, at this time. Will improve with involvement in seniors , will try to have this arranged, increase in SSRI dose in the interim

## 2013-05-20 NOTE — Assessment & Plan Note (Signed)
Chronic use of lolmotil as needed, suspect IBS as well as fecal incontinence

## 2013-05-20 NOTE — Assessment & Plan Note (Signed)
celebresx and tramadol to be continued as before

## 2013-05-20 NOTE — Assessment & Plan Note (Signed)
Controlled, no change in medication  

## 2013-05-20 NOTE — Assessment & Plan Note (Signed)
Pt reports unable to see neurologist due to cost, we will follow upo on this and try to get an appointment set up , as her tremor is severe and somewhat disabling

## 2013-05-21 ENCOUNTER — Telehealth: Payer: Self-pay | Admitting: Family Medicine

## 2013-05-21 ENCOUNTER — Other Ambulatory Visit: Payer: Self-pay | Admitting: Family Medicine

## 2013-05-21 LAB — HEMOCCULT GUIAC POC 1CARD (OFFICE)

## 2013-05-21 NOTE — Telephone Encounter (Signed)
Noted.  Fax sent to caseworker with [facility]

## 2013-05-21 NOTE — Telephone Encounter (Signed)
Thank you Luann, not sure why "if she has a ride " she is currently being seen by [facility], will ask nurse to make [facility] caregiver aware of this appt, she has another issue to discuss with the [facility] provider as in pt involvement with senior citizens, so I will forward to nurse involved. pls make [facility] caseworker aware of mammo appt scheduled for pt , so that she is able to go, pt stated she would go "if she had a ride" not sure what is going on with that

## 2013-05-21 NOTE — Addendum Note (Signed)
Addended by: Kandis Fantasia B on: 05/21/2013 08:33 AM   Modules accepted: Orders

## 2013-05-31 ENCOUNTER — Other Ambulatory Visit: Payer: Self-pay | Admitting: Family Medicine

## 2013-06-01 ENCOUNTER — Other Ambulatory Visit: Payer: Self-pay | Admitting: Family Medicine

## 2013-06-05 ENCOUNTER — Telehealth: Payer: Self-pay | Admitting: Family Medicine

## 2013-06-05 DIAGNOSIS — G894 Chronic pain syndrome: Secondary | ICD-10-CM

## 2013-06-05 NOTE — Telephone Encounter (Signed)
Pls call  [name] and send a msg to her [facility] advocate, Bradd Canary,  Acknowledging that I have received and will act on recent communication re pain management needs, and will refer pt to Dr Merlene Laughter . pls verify with Vickii Chafe that she  will go. I am entering the referral  (I am also leaving [facility] letter in your area for reference )

## 2013-06-06 ENCOUNTER — Encounter (HOSPITAL_COMMUNITY): Payer: Medicare PPO

## 2013-06-08 NOTE — Telephone Encounter (Signed)
Spoke with Vickii Chafe and she is willing to go to [facility] but she has a problem with transportation. Called Carrol Spinks and left a message to call me back to discuss transport issues

## 2013-06-11 ENCOUNTER — Telehealth: Payer: Self-pay | Admitting: Family Medicine

## 2013-06-11 NOTE — Telephone Encounter (Signed)
Spoke with [facility] and they will call me back. See previous message

## 2013-06-11 NOTE — Telephone Encounter (Signed)
Will call [place] tomorrow as requested. Her Appt is Feb 12, at 10am

## 2013-06-12 NOTE — Telephone Encounter (Signed)
Scott aware. Will try to get transport lined up

## 2013-06-19 ENCOUNTER — Other Ambulatory Visit: Payer: Self-pay | Admitting: Family Medicine

## 2013-06-25 ENCOUNTER — Encounter: Payer: Self-pay | Admitting: Family Medicine

## 2013-06-25 ENCOUNTER — Ambulatory Visit (INDEPENDENT_AMBULATORY_CARE_PROVIDER_SITE_OTHER): Payer: Medicare PPO | Admitting: Family Medicine

## 2013-06-25 ENCOUNTER — Encounter (INDEPENDENT_AMBULATORY_CARE_PROVIDER_SITE_OTHER): Payer: Self-pay

## 2013-06-25 VITALS — BP 104/74 | HR 85 | Resp 18 | Ht 63.0 in | Wt 198.1 lb

## 2013-06-25 DIAGNOSIS — M25569 Pain in unspecified knee: Secondary | ICD-10-CM

## 2013-06-25 DIAGNOSIS — R259 Unspecified abnormal involuntary movements: Secondary | ICD-10-CM

## 2013-06-25 DIAGNOSIS — F0280 Dementia in other diseases classified elsewhere without behavioral disturbance: Secondary | ICD-10-CM

## 2013-06-25 DIAGNOSIS — Z8489 Family history of other specified conditions: Secondary | ICD-10-CM

## 2013-06-25 DIAGNOSIS — G309 Alzheimer's disease, unspecified: Secondary | ICD-10-CM

## 2013-06-25 DIAGNOSIS — M549 Dorsalgia, unspecified: Secondary | ICD-10-CM

## 2013-06-25 DIAGNOSIS — I10 Essential (primary) hypertension: Secondary | ICD-10-CM

## 2013-06-25 DIAGNOSIS — K219 Gastro-esophageal reflux disease without esophagitis: Secondary | ICD-10-CM

## 2013-06-25 DIAGNOSIS — T7691XA Unspecified adult maltreatment, suspected, initial encounter: Secondary | ICD-10-CM

## 2013-06-25 DIAGNOSIS — F3289 Other specified depressive episodes: Secondary | ICD-10-CM

## 2013-06-25 DIAGNOSIS — R251 Tremor, unspecified: Secondary | ICD-10-CM

## 2013-06-25 DIAGNOSIS — M25562 Pain in left knee: Secondary | ICD-10-CM

## 2013-06-25 DIAGNOSIS — IMO0002 Reserved for concepts with insufficient information to code with codable children: Secondary | ICD-10-CM

## 2013-06-25 DIAGNOSIS — J45909 Unspecified asthma, uncomplicated: Secondary | ICD-10-CM

## 2013-06-25 DIAGNOSIS — F329 Major depressive disorder, single episode, unspecified: Secondary | ICD-10-CM

## 2013-06-25 MED ORDER — DICLOFENAC SODIUM 1 % TD GEL: 2.0000 g | Freq: Four times a day (QID) | TRANSDERMAL | Status: DC

## 2013-06-25 MED ORDER — KETOROLAC TROMETHAMINE 60 MG/2ML IM SOLN
60.0000 mg | Freq: Once | INTRAMUSCULAR | Status: AC
  Administered 2013-06-25: 60 mg via INTRAMUSCULAR

## 2013-06-25 NOTE — Patient Instructions (Addendum)
F/u in 3 month, call if you need me before  We will need to contact social services  And /or [facility]  again about your situation, since you report having no help at home in the past 3 weeks  Toradol 60mg  im today in the office for knee and back pain.  New for knee pain is voltaren gel OK to apply 2 to 4 times daily for knee pain. STOP celebrex since not helping   You need a bone density scan and also may need to see a rheumatologist,but I will hold on those referrals at this time  Please keep appointment with Dr Merlene Laughter for tremor and pain

## 2013-06-27 ENCOUNTER — Other Ambulatory Visit: Payer: Self-pay | Admitting: Family Medicine

## 2013-07-11 ENCOUNTER — Encounter (HOSPITAL_COMMUNITY): Payer: Medicare PPO

## 2013-07-15 ENCOUNTER — Telehealth: Payer: Self-pay | Admitting: Family Medicine

## 2013-07-15 NOTE — Assessment & Plan Note (Signed)
Continues to report that 2 of her 3 children have nothing to do with her , and the only one who keeps in touch, the mother of the grandson who lives with her is disabled and unable to even visit , much less to provide assistance

## 2013-07-15 NOTE — Progress Notes (Signed)
Subjective:    Patient ID: [name], female    DOB: [birth year], 79 y.o.   MRN: [id number]  HPI The PT is here for follow up and re-evaluation of chronic medical conditions, medication management and review of any available recent lab and radiology data.  Preventive health is updated, specifically  Cancer screening and Immunization.   Has seen ortho since last visit, states she will not go back since appears nothing can be done to help her knee pain. The PT denies any adverse reactions to current medications since the last visit. States celebrex is of no benefit States that she again has no help in the home for the past 3 weeks, feels as though everyone who comes to help tries to take advantage of her financially, and still denies any help from 2 of her 3 children who had been involved in the past. grandson Tylerlives with her , but  Goes out to work      Review of Systems See HPI Denies recent fever or chills. Denies sinus pressure, nasal congestion, ear pain or sore throat. Denies chest congestion, productive cough or wheezing. Denies chest pains, palpitations and leg swelling Denies abdominal pain, nausea, vomiting or constipation.Chronic loose stools   Denies dysuria, frequency, hesitancy has chronic  incontinence.  Denies headaches, seizures, numbness, or tingling. Denies uncontrolled depression, anxiety or insomnia. Denies skin break down or rash.        Objective:   Physical Exam BP 104/74  Pulse 85  Resp 18  Ht 5\' 3"  (1.6 m)  Wt 198 lb 1.9 oz (89.867 kg)  BMI 35.10 kg/m2  SpO2 90% Patient alert and oriented and in no cardiopulmonary distress.  HEENT: No facial asymmetry, EOMI, no sinus tenderness,  oropharynx pink and moist.  Neck decreased ROM, no JVD, no adenopathy.  Chest: Clear to auscultation bilaterally.Decreased though adequate breath sounds  CVS: S1, S2 no murmurs, no S3.  ABD: Soft non tender. Bowel sounds normal.  Ext: No edema  MS:  decreased  ROM spine, shoulders, hips and knees.Joint deformity in many joints including the fingers  Skin: Intact, no ulcerations or rash noted.No erythema of legs  Psych: Good eye contact, normal affect. Memory intact not anxious or depressed appearing.  CNS: CN 2-12 intact, power, tone and sensation normal throughout.tremor at rest in both hands        Assessment & Plan:  BACK PAIN Uncontrolled , toradol administered and she is to start voltaren gel, celebrex d/c due no relief. Has upcoming appt with neurologist who will also hopefully help with pain management. Depends on assistive device for safe mobility  Knee pain, left Uncontrolled, recently evaluated by ortho, states she will not return as nothing can be done for her. voltaren topically as needed, up to 4 times per day  Tremor Disabling limits ADL to some extent has neurology appt upcoming which she needs to keep  DEPRESSION, RECURRENT Stable on current medication, however ongoing need for help in the home and with her ADL's . Pt continues to feel that all help which comes in is trying to rob  Her, she is to contact social services for new assistance  Family history of neglect Continues to report that 2 of her 3 children have nothing to do with her , and the only one who keeps in touch, the mother of the grandson who lives with her is disabled and unable to even visit , much less to provide assistance  HYPERTENSION Controlled, no change in  medication   ASTHMA Controlled, no change in medication   ALZHEIMERS DISEASE Stable on aricept , formal MMSE next visit  GERD Controlled, no change in medication

## 2013-07-15 NOTE — Assessment & Plan Note (Signed)
Controlled, no change in medication  

## 2013-07-15 NOTE — Assessment & Plan Note (Signed)
Disabling limits ADL to some extent has neurology appt upcoming which she needs to keep

## 2013-07-15 NOTE — Assessment & Plan Note (Signed)
Uncontrolled, recently evaluated by ortho, states she will not return as nothing can be done for her. voltaren topically as needed, up to 4 times per day

## 2013-07-15 NOTE — Assessment & Plan Note (Signed)
Stable on aricept , formal MMSE next visit

## 2013-07-15 NOTE — Telephone Encounter (Signed)
Pls contact pt and find out if she has any help at home and if she is wanting to get another replacement , at last visit , stated she had no in home assistance anymore , and plan was to ask social services /[facility] to look at her living situation again, but no record of this follow up being done. Pls check on this, I believe she is known to social svc and has a case worker , verify this with her pls and if you send me the name and tele contact info . I will spk to that person. If she does not and [facility] can see her, then pls refer to [facility] for social work consult. Pls send me an update as to what you are able to do , thanks

## 2013-07-15 NOTE — Assessment & Plan Note (Signed)
Stable on current medication, however ongoing need for help in the home and with her ADL's . Pt continues to feel that all help which comes in is trying to rob  Her, she is to contact social services for new assistance

## 2013-07-15 NOTE — Assessment & Plan Note (Signed)
Uncontrolled , toradol administered and she is to start voltaren gel, celebrex d/c due no relief. Has upcoming appt with neurologist who will also hopefully help with pain management. Depends on assistive device for safe mobility

## 2013-07-16 NOTE — Telephone Encounter (Signed)
Bradd Canary [facility]) seen her on 2/5 and will see her again in 2 weeks. She is going to send the last note over and [name], while I had her on the phone wants something else for her pain other than the tramadol and voltaren gel

## 2013-07-17 NOTE — Telephone Encounter (Signed)
She just started both and will need to remain on this, give them some time also I can increase the dose of tramadol, advise her to increase tramadol to one twice daily and see if more effective pls, I am entering the change in dose historically , pls send after you speak with her. Other meds will require hard scripts, she is not freely mobile so not a very realistic/pracrical option. Currently has been on LOWEST dose of tramadol

## 2013-07-17 NOTE — Telephone Encounter (Signed)
Forwarding the addendum again did not et to you

## 2013-07-17 NOTE — Addendum Note (Signed)
Addended by: Tula Nakayama E on: 07/17/2013 10:06 AM   Modules accepted: Orders

## 2013-07-18 ENCOUNTER — Telehealth: Payer: Self-pay | Admitting: Family Medicine

## 2013-07-18 ENCOUNTER — Other Ambulatory Visit: Payer: Self-pay

## 2013-07-18 MED ORDER — TRAMADOL HCL 50 MG PO TABS: 50.0000 mg | ORAL_TABLET | Freq: Two times a day (BID) | ORAL | Status: DC

## 2013-07-18 NOTE — Telephone Encounter (Signed)
Response documented

## 2013-07-18 NOTE — Telephone Encounter (Signed)
Pt aware and new dose to be sent to her pharmacy

## 2013-07-18 NOTE — Telephone Encounter (Signed)
I have tried several times to send response to [name] 's message as an addenedum unsuccessfully, pls read in pt record thanks

## 2013-07-23 ENCOUNTER — Other Ambulatory Visit: Payer: Self-pay | Admitting: Family Medicine

## 2013-07-25 ENCOUNTER — Encounter (HOSPITAL_COMMUNITY): Payer: Medicare PPO

## 2013-07-30 ENCOUNTER — Telehealth: Payer: Self-pay | Admitting: Family Medicine

## 2013-07-30 ENCOUNTER — Telehealth: Payer: Self-pay

## 2013-07-30 DIAGNOSIS — M25561 Pain in right knee: Secondary | ICD-10-CM

## 2013-07-30 DIAGNOSIS — M25562 Pain in left knee: Principal | ICD-10-CM

## 2013-07-30 NOTE — Telephone Encounter (Signed)
Patient called earlier with same message and it has already been sent to Dr

## 2013-07-30 NOTE — Telephone Encounter (Signed)
Please advise.  Is referral ok?

## 2013-07-30 NOTE — Telephone Encounter (Signed)
pls refer to dr Aline Brochure, ebvaluate knee pain, bilateral

## 2013-07-31 ENCOUNTER — Other Ambulatory Visit: Payer: Self-pay | Admitting: Family Medicine

## 2013-07-31 NOTE — Addendum Note (Signed)
Addended by: Denman George B on: 07/31/2013 09:06 AM   Modules accepted: Orders

## 2013-07-31 NOTE — Telephone Encounter (Signed)
Referral entered and patient aware.

## 2013-08-02 ENCOUNTER — Other Ambulatory Visit: Payer: Self-pay | Admitting: Family Medicine

## 2013-08-16 ENCOUNTER — Other Ambulatory Visit: Payer: Self-pay | Admitting: Family Medicine

## 2013-08-23 ENCOUNTER — Ambulatory Visit (INDEPENDENT_AMBULATORY_CARE_PROVIDER_SITE_OTHER): Payer: Medicare PPO | Admitting: Orthopedic Surgery

## 2013-08-23 ENCOUNTER — Encounter: Payer: Self-pay | Admitting: Orthopedic Surgery

## 2013-08-23 ENCOUNTER — Ambulatory Visit (INDEPENDENT_AMBULATORY_CARE_PROVIDER_SITE_OTHER): Payer: Medicare PPO

## 2013-08-23 VITALS — BP 129/68 | Ht 63.0 in | Wt 198.0 lb

## 2013-08-23 DIAGNOSIS — M25569 Pain in unspecified knee: Secondary | ICD-10-CM

## 2013-08-23 DIAGNOSIS — M1710 Unilateral primary osteoarthritis, unspecified knee: Secondary | ICD-10-CM

## 2013-08-23 DIAGNOSIS — IMO0002 Reserved for concepts with insufficient information to code with codable children: Secondary | ICD-10-CM

## 2013-08-23 DIAGNOSIS — L0291 Cutaneous abscess, unspecified: Secondary | ICD-10-CM

## 2013-08-23 DIAGNOSIS — M179 Osteoarthritis of knee, unspecified: Secondary | ICD-10-CM

## 2013-08-23 DIAGNOSIS — L0390 Cellulitis, unspecified: Secondary | ICD-10-CM

## 2013-08-23 MED ORDER — DOXYCYCLINE HYCLATE 100 MG PO TABS: 100.0000 mg | ORAL_TABLET | Freq: Two times a day (BID) | ORAL | Status: AC

## 2013-08-23 MED ORDER — HYDROCODONE-ACETAMINOPHEN 5-325 MG PO TABS: 1.0000 | ORAL_TABLET | Freq: Four times a day (QID) | ORAL | Status: DC | PRN

## 2013-08-23 NOTE — Patient Instructions (Addendum)
Vascular consult for venous stasis  Start doxycycline 100 mg twice a day   Norco 5 mg every 4-6 hours when necessary pain  I see several problems here. #1 you have spinal stenosis and that is the cause of your inability to stand up and walk. At this point with your age and medical history and previous back surgeries there is no further treatment again be offered at this time.  #2 you have posttraumatic osteoarthritis of the left knee secondary to the fracture however her range of motion is in a functional position and I would not recommend surgical intervention at this time mainly because of your severe venous stasis disease which is currently active.  #3 venous stasis disease recommend consult with a vascular surgeon and resume doxycycline 100 mg twice a day please followup with your family physician

## 2013-08-23 NOTE — Progress Notes (Signed)
Patient ID: [name], female   DOB: 05-Mar-1935, 79 y.o.   MRN: [id number]  Chief Complaint  Patient presents with  . Knee Pain    Left knee pain, no injury. Referred by Dr. Moshe Cipro.    HISTORY: This is a 49 rolled female who presents with left knee pain radiating to her left foot. Approximately 3 years ago she had open treatment internal fixation of a tibial plateau fracture on the left side and then the left ankle fracture. Did well she had the hardware removed from the left knee. She now complains of 10 out of 10 pain from the knee radiating to the left foot unrelieved by tramadol. She denies hip pain but complains that she has scoliosis and has had 2 back surgeries. Neither time the skull this was corrected according to her she is unclear why they did the surgery although at that time she was complaining of back pain  She requires a walker to ambulate she cannot stand up straight she leans almost 90 from the waist and leans on the walker. She complains of burning throbbing pain 10 out of 10 constant worse with walking improved with rest associated with swelling. Review of systems skin changes reported as redness she does have venous stasis disease and she has tremors.  General appearance is normal, the patient is alert and oriented x3 with normal mood and affect. BP 129/68  Ht 5\' 3"  (1.6 m)  Wt 198 lb (89.812 kg)  BMI 35.08 kg/m2  The upper extremities show no major deformities and she has functional range of motion without instability she has normal muscle tone and normal skin  Right lower extremity alignment is within normal limits, and there are no contractures, no subluxations or dislocations muscle tone is normal skin shows extreme redness in the pretibial region. This area is tender. There is swelling and tension in the skin.  Left lower extremity knee flexion is 110 knee extension looks to be full cannot really tell because of the size of her leg. She has distal swelling and  redness consistent with cellulitis or venous stasis disease again there is tension in the skin. The joint is aligned without contracture subluxation atrophy or tremor.  Distal peripheral edema is noted with pitting edema and tenderness in the pretibial region  She has normal station and her extremities without lymphadenopathy. She has 2+ reflexes and her balance is poor  X-rays show internal fixation of the ankle with anatomic alignment these are old x-rays. She has severe degenerative disease of the lumbar spine again old x-rays. She has arch arthritis of the left hip on an old x-ray.  Her x-ray of her knee today shows osteoarthritis with previous evidence of bone filler for the fractured tibial plateau which actually healed well. There is mild deformity in the distal femur and rotational alignment of the distal tib-fib complex which may be positional on the x-ray  Encounter Diagnoses  Name Primary?  . Knee pain   . Cellulitis   . Arthritis of knee, degenerative Yes    venous stasis disease  Meds ordered this encounter  Medications  . doxycycline (VIBRA-TABS) 100 MG tablet    Sig: Take 1 tablet (100 mg total) by mouth 2 (two) times daily.    Dispense:  28 tablet    Refill:  0  . HYDROcodone-acetaminophen (NORCO/VICODIN) 5-325 MG per tablet    Sig: Take 1 tablet by mouth every 6 (six) hours as needed for moderate pain.    Dispense:  30 tablet    Refill:  0    Unfortunately this patient is not a surgical candidate he needs further vascular workup to control her venous stasis disease. She has spinal stenosis which is causing her to walk in a flexed posture with the so-called grocery cart sign. I do not think she's in the replacement candidate. She will need chronic pain management.

## 2013-08-29 ENCOUNTER — Telehealth: Payer: Self-pay | Admitting: *Deleted

## 2013-08-29 NOTE — Telephone Encounter (Signed)
Faxed patient's records to Dr. Moshe Cipro, so they can make the referral to a Vein and Vascular specialist, due to patient has Rayne.

## 2013-09-04 ENCOUNTER — Other Ambulatory Visit: Payer: Self-pay | Admitting: Family Medicine

## 2013-09-05 ENCOUNTER — Telehealth: Payer: Self-pay | Admitting: Family Medicine

## 2013-09-10 NOTE — Telephone Encounter (Signed)
noted 

## 2013-09-12 ENCOUNTER — Other Ambulatory Visit: Payer: Self-pay | Admitting: Family Medicine

## 2013-09-17 ENCOUNTER — Other Ambulatory Visit: Payer: Self-pay | Admitting: *Deleted

## 2013-09-17 DIAGNOSIS — L97909 Non-pressure chronic ulcer of unspecified part of unspecified lower leg with unspecified severity: Secondary | ICD-10-CM

## 2013-09-19 ENCOUNTER — Emergency Department (HOSPITAL_COMMUNITY)
Admission: EM | Admit: 2013-09-19 | Discharge: 2013-09-19 | Disposition: A | Discharge status: Home / Self Care | Payer: Medicare PPO | Attending: Emergency Medicine | Admitting: Emergency Medicine

## 2013-09-19 ENCOUNTER — Encounter (HOSPITAL_COMMUNITY): Payer: Self-pay | Admitting: Emergency Medicine

## 2013-09-19 ENCOUNTER — Emergency Department (HOSPITAL_COMMUNITY): Payer: Medicare PPO

## 2013-09-19 DIAGNOSIS — Z791 Long term (current) use of non-steroidal anti-inflammatories (NSAID): Secondary | ICD-10-CM | POA: Insufficient documentation

## 2013-09-19 DIAGNOSIS — E669 Obesity, unspecified: Secondary | ICD-10-CM | POA: Insufficient documentation

## 2013-09-19 DIAGNOSIS — F333 Major depressive disorder, recurrent, severe with psychotic symptoms: Secondary | ICD-10-CM | POA: Insufficient documentation

## 2013-09-19 DIAGNOSIS — F411 Generalized anxiety disorder: Secondary | ICD-10-CM | POA: Insufficient documentation

## 2013-09-19 DIAGNOSIS — K529 Noninfective gastroenteritis and colitis, unspecified: Secondary | ICD-10-CM

## 2013-09-19 DIAGNOSIS — J449 Chronic obstructive pulmonary disease, unspecified: Secondary | ICD-10-CM | POA: Insufficient documentation

## 2013-09-19 DIAGNOSIS — Z880 Allergy status to penicillin: Secondary | ICD-10-CM | POA: Insufficient documentation

## 2013-09-19 DIAGNOSIS — IMO0002 Reserved for concepts with insufficient information to code with codable children: Secondary | ICD-10-CM | POA: Insufficient documentation

## 2013-09-19 DIAGNOSIS — Z90710 Acquired absence of both cervix and uterus: Secondary | ICD-10-CM | POA: Insufficient documentation

## 2013-09-19 DIAGNOSIS — Z9889 Other specified postprocedural states: Secondary | ICD-10-CM | POA: Insufficient documentation

## 2013-09-19 DIAGNOSIS — Z87891 Personal history of nicotine dependence: Secondary | ICD-10-CM | POA: Insufficient documentation

## 2013-09-19 DIAGNOSIS — R195 Other fecal abnormalities: Secondary | ICD-10-CM

## 2013-09-19 DIAGNOSIS — J4489 Other specified chronic obstructive pulmonary disease: Secondary | ICD-10-CM | POA: Insufficient documentation

## 2013-09-19 DIAGNOSIS — K5289 Other specified noninfective gastroenteritis and colitis: Secondary | ICD-10-CM | POA: Insufficient documentation

## 2013-09-19 DIAGNOSIS — K922 Gastrointestinal hemorrhage, unspecified: Secondary | ICD-10-CM | POA: Insufficient documentation

## 2013-09-19 DIAGNOSIS — Z9089 Acquired absence of other organs: Secondary | ICD-10-CM | POA: Insufficient documentation

## 2013-09-19 DIAGNOSIS — I10 Essential (primary) hypertension: Secondary | ICD-10-CM | POA: Insufficient documentation

## 2013-09-19 DIAGNOSIS — K219 Gastro-esophageal reflux disease without esophagitis: Secondary | ICD-10-CM | POA: Insufficient documentation

## 2013-09-19 DIAGNOSIS — M1990 Unspecified osteoarthritis, unspecified site: Secondary | ICD-10-CM | POA: Insufficient documentation

## 2013-09-19 DIAGNOSIS — Z79899 Other long term (current) drug therapy: Secondary | ICD-10-CM | POA: Insufficient documentation

## 2013-09-19 DIAGNOSIS — K5732 Diverticulitis of large intestine without perforation or abscess without bleeding: Secondary | ICD-10-CM | POA: Insufficient documentation

## 2013-09-19 HISTORY — DX: Diverticulitis of intestine, part unspecified, without perforation or abscess without bleeding: K57.92

## 2013-09-19 LAB — CBC WITH DIFFERENTIAL/PLATELET
Basophils Absolute: 0 10*3/uL (ref 0.0–0.1)
Basophils Relative: 0 % (ref 0–1)
Eosinophils Absolute: 0.1 10*3/uL (ref 0.0–0.7)
Eosinophils Relative: 2 % (ref 0–5)
HCT: 36.3 % (ref 36.0–46.0)
Hemoglobin: 11.6 g/dL — ABNORMAL LOW (ref 12.0–15.0)
Lymphocytes Relative: 30 % (ref 12–46)
Lymphs Abs: 2.6 10*3/uL (ref 0.7–4.0)
MCH: 29.2 pg (ref 26.0–34.0)
MCHC: 32 g/dL (ref 30.0–36.0)
MCV: 91.4 fL (ref 78.0–100.0)
Monocytes Absolute: 0.5 10*3/uL (ref 0.1–1.0)
Monocytes Relative: 6 % (ref 3–12)
Neutro Abs: 5.4 10*3/uL (ref 1.7–7.7)
Neutrophils Relative %: 62 % (ref 43–77)
Platelets: 330 10*3/uL (ref 150–400)
RBC: 3.97 MIL/uL (ref 3.87–5.11)
RDW: 13.8 % (ref 11.5–15.5)
WBC: 8.6 10*3/uL (ref 4.0–10.5)

## 2013-09-19 LAB — COMPREHENSIVE METABOLIC PANEL
ALT: 9 U/L (ref 0–35)
AST: 22 U/L (ref 0–37)
Albumin: 3.5 g/dL (ref 3.5–5.2)
Alkaline Phosphatase: 101 U/L (ref 39–117)
BUN: 9 mg/dL (ref 6–23)
CO2: 31 mEq/L (ref 19–32)
Calcium: 9.1 mg/dL (ref 8.4–10.5)
Chloride: 102 mEq/L (ref 96–112)
Creatinine, Ser: 0.66 mg/dL (ref 0.50–1.10)
GFR calc non Af Amer: 84 mL/min — ABNORMAL LOW (ref >90)
GLUCOSE: 100 mg/dL — AB (ref 70–99)
Potassium: 3.3 mEq/L — ABNORMAL LOW (ref 3.7–5.3)
Sodium: 145 mEq/L (ref 137–147)
Total Bilirubin: 0.2 mg/dL — ABNORMAL LOW (ref 0.3–1.2)
Total Protein: 7.7 g/dL (ref 6.0–8.3)

## 2013-09-19 LAB — URINALYSIS, ROUTINE W REFLEX MICROSCOPIC
Bilirubin Urine: NEGATIVE
GLUCOSE, UA: NEGATIVE mg/dL
Ketones, ur: NEGATIVE mg/dL
LEUKOCYTES UA: NEGATIVE
NITRITE: NEGATIVE
PROTEIN: NEGATIVE mg/dL
Specific Gravity, Urine: <1.005 — ABNORMAL LOW (ref 1.005–1.030)
UROBILINOGEN UA: 0.2 mg/dL (ref 0.0–1.0)
pH: 7 (ref 5.0–8.0)

## 2013-09-19 LAB — URINE MICROSCOPIC-ADD ON

## 2013-09-19 LAB — PROTIME-INR
INR: 1.02 (ref 0.00–1.49)
PROTHROMBIN TIME: 13.2 s (ref 11.6–15.2)

## 2013-09-19 MED ORDER — IOHEXOL 300 MG/ML  SOLN: 50.0000 mL | Freq: Once | INTRAMUSCULAR | Status: DC | PRN

## 2013-09-19 MED ORDER — IOHEXOL 300 MG/ML  SOLN
100.0000 mL | Freq: Once | INTRAMUSCULAR | Status: AC | PRN
  Administered 2013-09-19: 100 mL via INTRAVENOUS

## 2013-09-19 MED ORDER — ONDANSETRON HCL 4 MG/2ML IJ SOLN
4.0000 mg | Freq: Once | INTRAMUSCULAR | Status: AC
  Administered 2013-09-19: 4 mg via INTRAVENOUS
  Filled 2013-09-19: qty 2

## 2013-09-19 MED ORDER — SODIUM CHLORIDE 0.9 % IJ SOLN
INTRAMUSCULAR | Status: AC
  Administered 2013-09-19: 18:00:00
  Filled 2013-09-19: qty 500

## 2013-09-19 MED ORDER — IOHEXOL 300 MG/ML  SOLN
50.0000 mL | Freq: Once | INTRAMUSCULAR | Status: AC | PRN
  Administered 2013-09-19: 50 mL via ORAL

## 2013-09-19 MED ORDER — TRAMADOL HCL 50 MG PO TABS
50.0000 mg | ORAL_TABLET | Freq: Once | ORAL | Status: AC
  Administered 2013-09-19: 50 mg via ORAL
  Filled 2013-09-19: qty 1

## 2013-09-19 MED ORDER — METRONIDAZOLE 500 MG PO TABS
500.0000 mg | ORAL_TABLET | Freq: Once | ORAL | Status: AC
  Administered 2013-09-19: 500 mg via ORAL
  Filled 2013-09-19: qty 1

## 2013-09-19 MED ORDER — CEPHALEXIN 500 MG PO CAPS: 500.0000 mg | ORAL_CAPSULE | Freq: Two times a day (BID) | ORAL | Status: AC

## 2013-09-19 MED ORDER — METRONIDAZOLE 500 MG PO TABS
500.0000 mg | ORAL_TABLET | Freq: Two times a day (BID) | ORAL | Status: AC
Start: 2013-09-19 — End: 2013-09-29

## 2013-09-19 NOTE — ED Notes (Signed)
EDP made aware of pt's O2 stats.

## 2013-09-19 NOTE — ED Notes (Signed)
Patient complaining of black, tarry stools that started this morning. Also reports lower abdominal and lower back pain. Denies emesis or nausea.

## 2013-09-19 NOTE — ED Notes (Signed)
MD at bedside. 

## 2013-09-19 NOTE — Discharge Instructions (Signed)
As discussed, your evaluation here is demonstrated colitis, or inflammation/infection of your colon. 1 it is important to take all medication as directed, and follow up with our gastroenterologist in one week.  Return here for concerning changes in your condition.   Colitis Colitis is inflammation of the colon. Colitis can be a short-term or long-standing (chronic) illness. Crohn's disease and ulcerative colitis are 2 types of colitis which are chronic. They usually require lifelong treatment. CAUSES  There are many different causes of colitis, including:  Viruses.  Germs (bacteria).  Medicine reactions. SYMPTOMS   Diarrhea.  Intestinal bleeding.  Pain.  Fever.  Throwing up (vomiting).  Tiredness (fatigue).  Weight loss.  Bowel blockage. DIAGNOSIS  The diagnosis of colitis is based on examination and stool or blood tests. X-rays, CT scan, and colonoscopy may also be needed. TREATMENT  Treatment may include:  Fluids given through the vein (intravenously).  Bowel rest (nothing to eat or drink for a period of time).  Medicine for pain and diarrhea.  Medicines (antibiotics) that kill germs.  Cortisone medicines.  Surgery. HOME CARE INSTRUCTIONS   Get plenty of rest.  Drink enough water and fluids to keep your urine clear or pale yellow.  Eat a well-balanced diet.  Call your caregiver for follow-up as recommended. SEEK IMMEDIATE MEDICAL CARE IF:   You develop chills.  You have an oral temperature above 102 F (38.9 C), not controlled by medicine.  You have extreme weakness, fainting, or dehydration.  You have repeated vomiting.  You develop severe belly (abdominal) pain or are passing bloody or tarry stools. MAKE SURE YOU:   Understand these instructions.  Will watch your condition.  Will get help right away if you are not doing well or get worse. Document Released: 06/17/2004 Document Revised: 08/02/2011 Document Reviewed: 09/12/2009 [facility]  Patient Information 2014 [place], [place].
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
## 2013-09-26 ENCOUNTER — Encounter: Payer: Self-pay | Admitting: Family Medicine

## 2013-09-26 ENCOUNTER — Ambulatory Visit (INDEPENDENT_AMBULATORY_CARE_PROVIDER_SITE_OTHER): Payer: Medicare PPO | Admitting: Family Medicine

## 2013-09-26 ENCOUNTER — Encounter (INDEPENDENT_AMBULATORY_CARE_PROVIDER_SITE_OTHER): Payer: Self-pay | Admitting: *Deleted

## 2013-09-26 VITALS — BP 120/78 | HR 93 | Resp 16 | Ht 63.0 in | Wt 213.8 lb

## 2013-09-26 DIAGNOSIS — M549 Dorsalgia, unspecified: Secondary | ICD-10-CM

## 2013-09-26 DIAGNOSIS — I10 Essential (primary) hypertension: Secondary | ICD-10-CM

## 2013-09-26 DIAGNOSIS — M1990 Unspecified osteoarthritis, unspecified site: Secondary | ICD-10-CM

## 2013-09-26 DIAGNOSIS — R251 Tremor, unspecified: Secondary | ICD-10-CM

## 2013-09-26 DIAGNOSIS — G309 Alzheimer's disease, unspecified: Secondary | ICD-10-CM

## 2013-09-26 DIAGNOSIS — R259 Unspecified abnormal involuntary movements: Secondary | ICD-10-CM

## 2013-09-26 DIAGNOSIS — F411 Generalized anxiety disorder: Secondary | ICD-10-CM

## 2013-09-26 DIAGNOSIS — K219 Gastro-esophageal reflux disease without esophagitis: Secondary | ICD-10-CM

## 2013-09-26 DIAGNOSIS — E669 Obesity, unspecified: Secondary | ICD-10-CM

## 2013-09-26 DIAGNOSIS — J45909 Unspecified asthma, uncomplicated: Secondary | ICD-10-CM

## 2013-09-26 DIAGNOSIS — F0280 Dementia in other diseases classified elsewhere without behavioral disturbance: Secondary | ICD-10-CM

## 2013-09-26 NOTE — Patient Instructions (Signed)
Annual wellness in 4 month, call if you need me before  Colitis is improved, based on your history and exam  You have appointment to see GI Doc on 10/11/2013 , and also an appt to evaluate the circulation in yourlegs on 5/28, it is very important that you keep both appointments  We will request an  appointment for you to see Dr Merlene Laughter in May to evaluate and treat your back pain and shaking NOT oerdering the MRI of your spine, I will leave him to order if he feels that is is necessary  Blood pressure is good and lungs are clear

## 2013-10-11 ENCOUNTER — Encounter (INDEPENDENT_AMBULATORY_CARE_PROVIDER_SITE_OTHER): Payer: Self-pay | Admitting: Internal Medicine

## 2013-10-11 ENCOUNTER — Encounter (INDEPENDENT_AMBULATORY_CARE_PROVIDER_SITE_OTHER): Payer: Self-pay | Admitting: *Deleted

## 2013-10-11 ENCOUNTER — Other Ambulatory Visit (INDEPENDENT_AMBULATORY_CARE_PROVIDER_SITE_OTHER): Payer: Self-pay | Admitting: *Deleted

## 2013-10-11 ENCOUNTER — Ambulatory Visit (INDEPENDENT_AMBULATORY_CARE_PROVIDER_SITE_OTHER): Payer: Medicare PPO | Admitting: Internal Medicine

## 2013-10-11 VITALS — BP 122/68 | HR 96 | Temp 98.0°F | Ht 63.0 in | Wt 204.5 lb

## 2013-10-11 DIAGNOSIS — K921 Melena: Secondary | ICD-10-CM | POA: Insufficient documentation
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
## 2013-10-11 NOTE — Patient Instructions (Addendum)
EGD with Dr Laural Golden.The risks and benefits such as perforation, bleeding, and infection were reviewed with the patient and is agreeable. Stop the Aleve. Try Tylenol Arthritis.

## 2013-10-17 ENCOUNTER — Encounter: Payer: Self-pay | Admitting: Vascular Surgery

## 2013-10-18 ENCOUNTER — Ambulatory Visit (INDEPENDENT_AMBULATORY_CARE_PROVIDER_SITE_OTHER): Payer: Medicare PPO | Admitting: Vascular Surgery

## 2013-10-18 ENCOUNTER — Ambulatory Visit (HOSPITAL_COMMUNITY)
Admission: RE | Admit: 2013-10-18 | Discharge: 2013-10-18 | Disposition: A | Discharge status: Home / Self Care | Payer: Medicare PPO | Source: Ambulatory Visit | Attending: Vascular Surgery | Admitting: Vascular Surgery

## 2013-10-18 ENCOUNTER — Encounter: Payer: Self-pay | Admitting: Vascular Surgery

## 2013-10-18 VITALS — BP 145/71 | HR 85 | Ht 63.0 in | Wt 198.6 lb

## 2013-10-18 DIAGNOSIS — L97909 Non-pressure chronic ulcer of unspecified part of unspecified lower leg with unspecified severity: Secondary | ICD-10-CM | POA: Insufficient documentation

## 2013-10-18 DIAGNOSIS — L98499 Non-pressure chronic ulcer of skin of other sites with unspecified severity: Principal | ICD-10-CM

## 2013-10-18 DIAGNOSIS — L97929 Non-pressure chronic ulcer of unspecified part of left lower leg with unspecified severity: Secondary | ICD-10-CM

## 2013-10-18 DIAGNOSIS — I83229 Varicose veins of left lower extremity with both ulcer of unspecified site and inflammation: Secondary | ICD-10-CM

## 2013-10-18 DIAGNOSIS — I83219 Varicose veins of right lower extremity with both ulcer of unspecified site and inflammation: Secondary | ICD-10-CM

## 2013-10-18 DIAGNOSIS — I7025 Atherosclerosis of native arteries of other extremities with ulceration: Secondary | ICD-10-CM | POA: Insufficient documentation

## 2013-10-18 DIAGNOSIS — I739 Peripheral vascular disease, unspecified: Secondary | ICD-10-CM

## 2013-10-18 DIAGNOSIS — L97919 Non-pressure chronic ulcer of unspecified part of right lower leg with unspecified severity: Secondary | ICD-10-CM
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
## 2013-10-31 ENCOUNTER — Encounter (HOSPITAL_COMMUNITY)
Admission: RE | Disposition: A | Discharge status: Home / Self Care | Payer: Self-pay | Source: Ambulatory Visit | Attending: Internal Medicine

## 2013-10-31 ENCOUNTER — Ambulatory Visit (HOSPITAL_COMMUNITY)
Admission: RE | Admit: 2013-10-31 | Discharge: 2013-10-31 | Disposition: A | Discharge status: Home / Self Care | Payer: Medicare PPO | Source: Ambulatory Visit | Attending: Internal Medicine | Admitting: Internal Medicine

## 2013-10-31 DIAGNOSIS — K219 Gastro-esophageal reflux disease without esophagitis: Secondary | ICD-10-CM

## 2013-10-31 DIAGNOSIS — K921 Melena: Secondary | ICD-10-CM

## 2013-10-31 DIAGNOSIS — Z79899 Other long term (current) drug therapy: Secondary | ICD-10-CM | POA: Insufficient documentation

## 2013-10-31 DIAGNOSIS — F411 Generalized anxiety disorder: Secondary | ICD-10-CM | POA: Insufficient documentation

## 2013-10-31 DIAGNOSIS — I10 Essential (primary) hypertension: Secondary | ICD-10-CM | POA: Insufficient documentation

## 2013-10-31 DIAGNOSIS — J449 Chronic obstructive pulmonary disease, unspecified: Secondary | ICD-10-CM | POA: Insufficient documentation

## 2013-10-31 DIAGNOSIS — M1990 Unspecified osteoarthritis, unspecified site: Secondary | ICD-10-CM | POA: Insufficient documentation

## 2013-10-31 DIAGNOSIS — J4489 Other specified chronic obstructive pulmonary disease: Secondary | ICD-10-CM | POA: Insufficient documentation

## 2013-10-31 HISTORY — PX: ESOPHAGOGASTRODUODENOSCOPY: SHX5428

## 2013-10-31 LAB — HEMOGLOBIN AND HEMATOCRIT, BLOOD
HCT: 31.5 % — ABNORMAL LOW (ref 36.0–46.0)
Hemoglobin: 10.1 g/dL — ABNORMAL LOW (ref 12.0–15.0)

## 2013-10-31 SURGERY — EGD (ESOPHAGOGASTRODUODENOSCOPY)
Anesthesia: Moderate Sedation

## 2013-10-31 MED ORDER — MEPERIDINE HCL 50 MG/ML IJ SOLN
INTRAMUSCULAR | Status: DC | PRN
  Administered 2013-10-31 (×2): 25 mg via INTRAVENOUS

## 2013-10-31 MED ORDER — MIDAZOLAM HCL 5 MG/5ML IJ SOLN
INTRAMUSCULAR | Status: AC
  Filled 2013-10-31: qty 10

## 2013-10-31 MED ORDER — SODIUM CHLORIDE 0.9 % IV SOLN
INTRAVENOUS | Status: DC
  Administered 2013-10-31: 1000 mL via INTRAVENOUS

## 2013-10-31 MED ORDER — STERILE WATER FOR IRRIGATION IR SOLN
Status: DC | PRN
  Administered 2013-10-31: 14:00:00

## 2013-10-31 MED ORDER — MEPERIDINE HCL 50 MG/ML IJ SOLN
INTRAMUSCULAR | Status: AC
  Filled 2013-10-31: qty 1

## 2013-10-31 MED ORDER — MIDAZOLAM HCL 5 MG/5ML IJ SOLN
INTRAMUSCULAR | Status: DC | PRN
  Administered 2013-10-31 (×5): 2 mg via INTRAVENOUS

## 2013-10-31 MED ORDER — BUTAMBEN-TETRACAINE-BENZOCAINE 2-2-14 % EX AERO
INHALATION_SPRAY | CUTANEOUS | Status: DC | PRN
  Administered 2013-10-31: 2 via TOPICAL
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
## 2013-10-31 NOTE — Discharge Instructions (Signed)
Resume usual medications and diet. No driving for 24 hours. Physician will call with results of blood tests. Gastrointestinal Endoscopy Care After Refer to this sheet in the next few weeks. These instructions provide you with information on caring for yourself after your procedure. Your caregiver may also give you more specific instructions. Your treatment has been planned according to current medical practices, but problems sometimes occur. Call your caregiver if you have any problems or questions after your procedure. HOME CARE INSTRUCTIONS  If you were given medicine to help you relax (sedative), do not drive, operate machinery, or sign important documents for 24 hours.  Avoid alcohol and hot or warm beverages for the first 24 hours after the procedure.  Only take over-the-counter or prescription medicines for pain, discomfort, or fever as directed by your caregiver. You may resume taking your normal medicines unless your caregiver tells you otherwise. Ask your caregiver when you may resume taking medicines that may cause bleeding, such as aspirin, clopidogrel, or warfarin.  You may return to your normal diet and activities on the day after your procedure, or as directed by your caregiver. Walking may help to reduce any bloated feeling in your abdomen.  Drink enough fluids to keep your urine clear or pale yellow.  You may gargle with salt water if you have a sore throat. SEEK IMMEDIATE MEDICAL CARE IF:  You have severe nausea or vomiting.  You have severe abdominal pain, abdominal cramps that last longer than 6 hours, or abdominal swelling (distention).  You have severe shoulder or back pain.  You have trouble swallowing.  You have shortness of breath, your breathing is shallow, or you are breathing faster than normal.  You have a fever or a rapid heartbeat.  You vomit blood or material that looks like coffee grounds.  You have bloody, black, or tarry stools. MAKE SURE  YOU:  Understand these instructions.  Will watch your condition.  Will get help right away if you are not doing well or get worse. Document Released: 12/23/2003 Document Revised: 11/09/2011 Document Reviewed: 08/10/2011 [facility] Patient Information 2014 [place], [place].

## 2013-10-31 NOTE — Op Note (Addendum)
EGD PROCEDURE REPORT  PATIENT:  [name]  MR#:  [id number] Birthdate:  05-28-1934, 79 y.o., female Endoscopist:  Dr. Rogene Houston, MD Referred By:  Dr. Tula Nakayama, MD Procedure Date: 10/31/2013  Procedure:   EGD  Indications:  Patient is 79 year old Caucasian female with multiple medical problems who presents with melena about 3 weeks ago. By the time she was seen in the office melena had cleared. Her stool was heme-positive. She denies nausea vomiting abdominal pain anorexia or weight loss. She was seen in emergency room 6 weeks ago for diarrhea and rectal bleeding and responded to antibiotic therapy. She says the diarrhea cleared three weeks ago. He gives a remote history of peptic ulcer disease 2            Informed Consent:  The risks, benefits, alternatives & imponderables which include, but are not limited to, bleeding, infection, perforation, drug reaction and potential missed lesion have been reviewed.  The potential for biopsy, lesion removal, esophageal dilation, etc. have also been discussed.  Questions have been answered.  All parties agreeable.  Please see history & physical in medical record for more information.  Medications:  Demerol 50 mg IV Versed 10 mg IV Cetacaine spray topically for oropharyngeal anesthesia  Description of procedure:  The endoscope was introduced through the mouth and advanced to the second portion of the duodenum without difficulty or limitations. The mucosal surfaces were surveyed very carefully during advancement of the scope and upon withdrawal.  Findings:  Esophagus:  Mucosa of the esophagus was normal. GE junction was unremarkable. GEJ:  38 cm Stomach:  Stomach was empty and distended very well with insufflation. Folds in the proximal stomach were normal. Examination of mucosa at gastric body and antrum was normal. Patent pyloric channel with friable mucosa without ulceration. Angularis fundus and cardia were unremarkable. Duodenum:   Normal bulbar and post bulbar mucosa.  Therapeutic/Diagnostic Maneuvers Performed:  None  Complications:  None  Impression: Pyloric channel inflammation otherwise normal EGD.  Comment; This finding would not explain patient's melena.  Recommendations:  Standard instructions given. Will check H&H today. H. Pylori serology.  REHMAN,NAJEEB U  10/31/2013  3:03 PM  CC: Dr. Tula Nakayama, MD & Dr. Rayne Du ref. provider found

## 2013-11-01 LAB — H. PYLORI ANTIBODY, IGG: H Pylori IgG: <0.4 {ISR}

## 2013-11-02 ENCOUNTER — Encounter (HOSPITAL_COMMUNITY): Payer: Self-pay | Admitting: Internal Medicine

## 2013-11-04 NOTE — Assessment & Plan Note (Signed)
Unchnaged. Patient re-educated about  the importance of commitment to a  minimum of 150 minutes of exercise per week. The importance of healthy food choices with portion control discussed. Encouraged to start a food diary, count calories and to consider  joining a support group. Sample diet sheets offered. Goals set by the patient for the next several months.    

## 2013-11-04 NOTE — Assessment & Plan Note (Signed)
Currently stable continue current meds

## 2013-11-04 NOTE — Assessment & Plan Note (Signed)
Increasingly severe and debilitating, seeing local ortho for this as 2nd opinion , feels as though she needs new Doc who will "try to help" her Reduction in fall risk and safety in the home discussed , and literature  provided on the discharge instruction sheet as well.

## 2013-11-04 NOTE — Assessment & Plan Note (Signed)
Increased dysphagia has upcoming gI eval

## 2013-11-04 NOTE — Assessment & Plan Note (Signed)
Tremor at rest , worse with specific focus on the extremity, no stigmata of Paqrkinson's on exam, need neurology to eval and treat if possible

## 2013-11-04 NOTE — Assessment & Plan Note (Signed)
stable on daily aricept continue same

## 2013-11-04 NOTE — Assessment & Plan Note (Signed)
Controlled, no change in medication  

## 2013-11-04 NOTE — Progress Notes (Signed)
   Subjective:    Patient ID: LING FLESCH, female    DOB: 1935-01-23, 79 y.o.   MRN: [id number]  HPI  Pt in for f/u colitis,  And her chronic conditions. She was diagnosed with mild colitis approx 1 week the ago in ED with abnormal abdominal CT, states her abdominal discomfort has resolved and she has ad no visible blood or black stool in the past 4 days. Denies fever or chills. Has upcoming appt with GI as  Well as vascular surgery , and has established with a new ortho Doc re her disabling knee pain and instability placing her at high risk for  Falls C/o uncontrolled pain as well as tremor will get eval by neurologist  Review of Systems See HPI Denies recent fever or chills. Denies sinus pressure, nasal congestion, ear pain or sore throat. Denies chest congestion, productive cough or wheezing. Denies chest pains, palpitations, PND , uses several pillows all the time, unable to comfortably lie flat, she has chronic right  leg swelling Denies abdominal pain, nausea, vomiting,diarrhea or constipation.   Denies dysuria, frequency, hesitancy does have  incontinence mobility aggravated  Denies joint pain, swelling and limitation in mobility. Denies headaches, seizures, numbness, or tingling. Denies uncontrolled  depression, anxiety or insomnia.A concerned neighbor has brought her today Denies skin break down or rash.        Objective:   Physical Exam BP 120/78  Pulse 93  Resp 16  Ht 5\' 3"  (1.6 m)  Wt 213 lb 12.8 oz (96.979 kg)  BMI 37.88 kg/m2  SpO2 88% Patient alert and oriented and in no cardiopulmonary distress.  HEENT: No facial asymmetry, EOMI,   oropharynx pink and moist.  Neck decreased though adequate ROM no JVD, no mass.  Chest: Clear to auscultation bilaterally.decreased though adequate air entry  CVS: S1, S2 no murmurs, no S3.  ABD: Soft non tender.   Ext:two plus left lower ext edema  MS: decreased  ROM spine, shoulders, hips and knees.deformity of knees    Skin: Intact, no ulcerations or rash noted.  Psych: Good eye contact, normal affect. Memory impaired mildly anxious not depressed appearing.  CNS: CN 2-12 intact, power,  normal throughout.no focal deficits noted.        Assessment & Plan:  HYPERTENSION Controlled, no change in medication   ASTHMA Currently stable continue current meds   ALZHEIMERS DISEASE stable on daily aricept continue same  OSTEOARTHRITIS Increasingly severe and debilitating, seeing local ortho for this as 2nd opinion , feels as though she needs new Doc who will "try to help" her Reduction in fall risk and safety in the home discussed , and literature  provided on the discharge instruction sheet as well.   GERD Increased dysphagia has upcoming gI eval  OBESITY Unchnaged Patient re-educated about  the importance of commitment to a  minimum of 150 minutes of exercise per week. The importance of healthy food choices with portion control discussed. Encouraged to start a food diary, count calories and to consider  joining a support group. Sample diet sheets offered. Goals set by the patient for the next several months.     ANXIETY stable currently, continue current medication

## 2013-11-04 NOTE — Assessment & Plan Note (Signed)
stable currently, continue current medication

## 2013-11-07 ENCOUNTER — Ambulatory Visit (INDEPENDENT_AMBULATORY_CARE_PROVIDER_SITE_OTHER): Payer: Medicare PPO | Admitting: Internal Medicine

## 2013-11-07 ENCOUNTER — Encounter (INDEPENDENT_AMBULATORY_CARE_PROVIDER_SITE_OTHER): Payer: Self-pay | Admitting: Internal Medicine

## 2013-11-07 VITALS — BP 146/64 | HR 96 | Temp 98.0°F | Ht 63.0 in | Wt 196.6 lb

## 2013-11-07 DIAGNOSIS — K589 Irritable bowel syndrome without diarrhea: Secondary | ICD-10-CM

## 2013-11-07 DIAGNOSIS — R197 Diarrhea, unspecified: Secondary | ICD-10-CM
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
## 2013-11-07 NOTE — Patient Instructions (Signed)
Probiotic daily. Fiber 4 gms daily. Imodium one in the am and one in the pm.  Stool diary for one month. Describe stools. OV in 2 months.

## 2013-11-15 ENCOUNTER — Other Ambulatory Visit: Payer: Self-pay

## 2013-11-15 DIAGNOSIS — R251 Tremor, unspecified: Secondary | ICD-10-CM

## 2013-11-17 ENCOUNTER — Other Ambulatory Visit: Payer: Self-pay | Admitting: Family Medicine

## 2013-11-22 ENCOUNTER — Other Ambulatory Visit: Payer: Self-pay

## 2013-11-22 MED ORDER — OMEPRAZOLE 40 MG PO CPDR: DELAYED_RELEASE_CAPSULE | ORAL | Status: DC

## 2013-11-22 MED ORDER — QUETIAPINE FUMARATE 25 MG PO TABS: ORAL_TABLET | ORAL | Status: DC

## 2013-11-22 MED ORDER — ALPRAZOLAM 0.5 MG PO TABS: ORAL_TABLET | ORAL | Status: DC

## 2013-12-08 ENCOUNTER — Other Ambulatory Visit: Payer: Self-pay | Admitting: Family Medicine

## 2013-12-19 ENCOUNTER — Telehealth: Payer: Self-pay

## 2013-12-19 DIAGNOSIS — R0902 Hypoxemia: Secondary | ICD-10-CM

## 2013-12-19 DIAGNOSIS — J449 Chronic obstructive pulmonary disease, unspecified: Secondary | ICD-10-CM

## 2013-12-19 NOTE — Telephone Encounter (Signed)
Noted  

## 2013-12-19 NOTE — Telephone Encounter (Signed)
We need to get room air ABG test done, not sure how "low"he is at rest , may need oxygen all the time, room air ABG is at the hospital, unless home health is able to get this, then pls give the order

## 2013-12-20 NOTE — Telephone Encounter (Signed)
Noted.  Will coordinate.

## 2013-12-25 NOTE — Telephone Encounter (Signed)
Will contact [facility] and see if this is a test that they can draw in the home setting.

## 2013-12-28 NOTE — Addendum Note (Signed)
Addended by: Tula Nakayama E on: 12/28/2013 11:43 AM   Modules accepted: Orders

## 2013-12-28 NOTE — Telephone Encounter (Signed)
[facility] is unable to draw this in the home setting.  How would we go about ordering this to be done at the hospital?

## 2013-12-28 NOTE — Telephone Encounter (Addendum)
I think a script with room air ABG, dx hypoxia, COPD, to be done by respiratory.If you are able to speak with someone in the dept ( respiratory ) and clarify . I  Have entered an order in the computer, pls get back to me with f/u

## 2013-12-31 NOTE — Telephone Encounter (Signed)
noted 

## 2013-12-31 NOTE — Telephone Encounter (Signed)
Called and spoke with patient and she wants to wait until after she comes in for ov to set up ABG.  The # to call to set this test up is [phone] and speak with Lattie Haw

## 2014-01-07 ENCOUNTER — Ambulatory Visit (INDEPENDENT_AMBULATORY_CARE_PROVIDER_SITE_OTHER): Payer: Medicare PPO | Admitting: Internal Medicine

## 2014-01-15 ENCOUNTER — Ambulatory Visit: Payer: Medicare PPO | Admitting: Family Medicine

## 2014-01-16 ENCOUNTER — Encounter: Payer: Self-pay | Admitting: Family Medicine

## 2014-01-16 ENCOUNTER — Ambulatory Visit (INDEPENDENT_AMBULATORY_CARE_PROVIDER_SITE_OTHER): Payer: Medicare PPO | Admitting: Family Medicine

## 2014-01-16 VITALS — BP 126/74 | HR 80 | Resp 18 | Ht 63.0 in | Wt 192.0 lb

## 2014-01-16 DIAGNOSIS — I10 Essential (primary) hypertension: Secondary | ICD-10-CM

## 2014-01-16 DIAGNOSIS — F0280 Dementia in other diseases classified elsewhere without behavioral disturbance: Secondary | ICD-10-CM

## 2014-01-16 DIAGNOSIS — Z23 Encounter for immunization: Secondary | ICD-10-CM

## 2014-01-16 DIAGNOSIS — F3289 Other specified depressive episodes: Secondary | ICD-10-CM

## 2014-01-16 DIAGNOSIS — J45909 Unspecified asthma, uncomplicated: Secondary | ICD-10-CM

## 2014-01-16 DIAGNOSIS — IMO0002 Reserved for concepts with insufficient information to code with codable children: Secondary | ICD-10-CM

## 2014-01-16 DIAGNOSIS — K219 Gastro-esophageal reflux disease without esophagitis: Secondary | ICD-10-CM

## 2014-01-16 DIAGNOSIS — F329 Major depressive disorder, single episode, unspecified: Secondary | ICD-10-CM

## 2014-01-16 DIAGNOSIS — G309 Alzheimer's disease, unspecified: Secondary | ICD-10-CM

## 2014-01-16 DIAGNOSIS — N3000 Acute cystitis without hematuria: Secondary | ICD-10-CM

## 2014-01-16 DIAGNOSIS — M5416 Radiculopathy, lumbar region: Secondary | ICD-10-CM

## 2014-01-16 MED ORDER — GABAPENTIN 300 MG PO CAPS: 300.0000 mg | ORAL_CAPSULE | Freq: Three times a day (TID) | ORAL | Status: DC

## 2014-01-16 NOTE — Assessment & Plan Note (Signed)
Controlled, no change in medication  

## 2014-01-16 NOTE — Patient Instructions (Signed)
F/u in December, call if you need me before  New for aching nerve pain down left lg is gabapentin one capsule at bedtime  Urine is being sent for c/s today, we will contact you if you have an infection  Flu vaccine today  Keep safe, and no falls!

## 2014-01-19 LAB — URINE CULTURE: Colony Count: >=100000

## 2014-01-20 ENCOUNTER — Telehealth: Payer: Self-pay | Admitting: Family Medicine

## 2014-01-20 ENCOUNTER — Other Ambulatory Visit: Payer: Self-pay | Admitting: Family Medicine

## 2014-01-20 DIAGNOSIS — Z23 Encounter for immunization: Secondary | ICD-10-CM | POA: Insufficient documentation

## 2014-01-20 DIAGNOSIS — N3000 Acute cystitis without hematuria: Secondary | ICD-10-CM | POA: Insufficient documentation

## 2014-01-20 NOTE — Assessment & Plan Note (Signed)
stable on aricept , continue same

## 2014-01-20 NOTE — Assessment & Plan Note (Signed)
Currently controlled, no recent asthma flare, continue current medication

## 2014-01-20 NOTE — Telephone Encounter (Signed)
Pls send me a phone message re pt's statement re "allergic reaction " she can recall to levaquin before sending, also in the interest of time pls just tell me during the clinic hours so she can start treatment for her UTI asap Thanks

## 2014-01-20 NOTE — Progress Notes (Signed)
   Subjective:    Patient ID: [name], female    DOB: 1935-01-17, 79 y.o.   MRN: [id number]  HPI The PT is here for follow up and re-evaluation of chronic medical conditions, medication management and review of any available recent lab and radiology data.  Preventive health is updated, specifically  Cancer screening and Immunization.   Questions or concerns regarding consultations or procedures which the PT has had in the interim are  addressed. The PT denies any adverse reactions to current medications since the last visit.  C/o increased and uncontrolled left lower extremity pain/ache, no recent falls or trauma C/o urinary frequency with slight discomfort    Review of Systems See HPI Denies recent fever or chills. Denies sinus pressure, nasal congestion, ear pain or sore throat. Denies chest congestion, productive cough or wheezing.Uses advair daily and this is helping her alot Denies chest pains, palpitations and leg swelling Denies abdominal pain, nausea, vomiting,diarrhea or constipation.    Denies headaches, seizures, numbness, or tingling. Denies uncontrolled depression, anxiety or insomnia. Denies skin break down or rash.        Objective:   Physical Exam  BP 126/74  Pulse 80  Resp 18  Ht 5\' 3"  (1.6 m)  Wt 192 lb (87.091 kg)  BMI 34.02 kg/m2  SpO2 92% Patient alert and oriented and in no cardiopulmonary distress.  HEENT: No facial asymmetry, EOMI,   oropharynx pink and moist.  Neck neck decreased ROM no JVD, no mass.  Chest: Adequate air entry bilaterally.few wheezes , no crackles  CVS: S1, S2 no murmurs, no S3.Regular rate.  ABD: Soft non tender.   Ext: No edema  MC:[id number]  ROM spine, shoulders, hips and knees.  Skin: Intact, no ulcerations or rash noted.  Psych: Good eye contact, normal affect. Memory impaired not anxious or depressed appearing.  CNS: CN 2-12 intact, power,  normal throughout.no focal deficits noted.Tremor when being  observed       Assessment & Plan:  HYPERTENSION Controlled, no change in medication   ASTHMA Currently controlled, no recent asthma flare, continue current medication  GERD Controlled, no change in medication   Lumbar back pain with radiculopathy affecting left lower extremity Increased back pain radiating to leg, tramadol for pain   ALZHEIMERS DISEASE stable on aricept , continue same  DEPRESSION, RECURRENT Currently controlled, no med change  Acute cystitis without hematuria  will await c/s , pt reports allergy to multiple antibiotics  Need for prophylactic vaccination and inoculation against influenza Vaccine administered at visit.

## 2014-01-20 NOTE — Assessment & Plan Note (Signed)
Vaccine administered at visit.  

## 2014-01-20 NOTE — Assessment & Plan Note (Addendum)
will await c/s , pt reports allergy to multiple antibiotics

## 2014-01-20 NOTE — Assessment & Plan Note (Signed)
Increased back pain radiating to leg, tramadol for pain

## 2014-01-20 NOTE — Assessment & Plan Note (Signed)
Controlled, no change in medication  

## 2014-01-20 NOTE — Assessment & Plan Note (Signed)
Currently controlled , no med change 

## 2014-01-21 ENCOUNTER — Other Ambulatory Visit: Payer: Self-pay

## 2014-01-21 MED ORDER — ONDANSETRON 4 MG PO TBDP: ORAL_TABLET | ORAL | Status: DC

## 2014-01-21 MED ORDER — LEVOFLOXACIN 500 MG PO TABS: 500.0000 mg | ORAL_TABLET | Freq: Every day | ORAL | Status: DC

## 2014-01-21 NOTE — Telephone Encounter (Signed)
Yes pls send 

## 2014-01-21 NOTE — Telephone Encounter (Signed)
Patient doesn't remember her reaction. Is agreeing to try the levaquin with the zofran since its only for 3 days

## 2014-02-04 ENCOUNTER — Other Ambulatory Visit: Payer: Self-pay | Admitting: Family Medicine

## 2014-02-18 ENCOUNTER — Other Ambulatory Visit: Payer: Self-pay

## 2014-02-18 MED ORDER — ALPRAZOLAM 0.5 MG PO TABS
ORAL_TABLET | ORAL | Status: DC
Start: 2014-02-18 — End: 2014-03-13

## 2014-02-26 ENCOUNTER — Telehealth: Payer: Self-pay | Admitting: Family Medicine

## 2014-02-26 NOTE — Telephone Encounter (Signed)
Noted  

## 2014-02-27 ENCOUNTER — Ambulatory Visit (INDEPENDENT_AMBULATORY_CARE_PROVIDER_SITE_OTHER): Payer: Medicare PPO

## 2014-02-27 ENCOUNTER — Encounter (INDEPENDENT_AMBULATORY_CARE_PROVIDER_SITE_OTHER): Payer: Self-pay

## 2014-02-27 DIAGNOSIS — Z23 Encounter for immunization: Secondary | ICD-10-CM

## 2014-03-13 ENCOUNTER — Emergency Department (HOSPITAL_COMMUNITY)
Admission: EM | Admit: 2014-03-13 | Discharge: 2014-03-13 | Disposition: A | Discharge status: Home / Self Care | Payer: Medicare PPO | Attending: Emergency Medicine | Admitting: Emergency Medicine

## 2014-03-13 ENCOUNTER — Encounter (HOSPITAL_COMMUNITY): Payer: Self-pay | Admitting: Emergency Medicine

## 2014-03-13 ENCOUNTER — Emergency Department (HOSPITAL_COMMUNITY): Payer: Medicare PPO

## 2014-03-13 ENCOUNTER — Other Ambulatory Visit: Payer: Self-pay

## 2014-03-13 DIAGNOSIS — M1990 Unspecified osteoarthritis, unspecified site: Secondary | ICD-10-CM | POA: Diagnosis not present

## 2014-03-13 DIAGNOSIS — Z7951 Long term (current) use of inhaled steroids: Secondary | ICD-10-CM | POA: Diagnosis not present

## 2014-03-13 DIAGNOSIS — E669 Obesity, unspecified: Secondary | ICD-10-CM | POA: Insufficient documentation

## 2014-03-13 DIAGNOSIS — R609 Edema, unspecified: Secondary | ICD-10-CM | POA: Insufficient documentation

## 2014-03-13 DIAGNOSIS — J40 Bronchitis, not specified as acute or chronic: Secondary | ICD-10-CM

## 2014-03-13 DIAGNOSIS — Z79899 Other long term (current) drug therapy: Secondary | ICD-10-CM | POA: Insufficient documentation

## 2014-03-13 DIAGNOSIS — F329 Major depressive disorder, single episode, unspecified: Secondary | ICD-10-CM | POA: Diagnosis not present

## 2014-03-13 DIAGNOSIS — I10 Essential (primary) hypertension: Secondary | ICD-10-CM | POA: Diagnosis not present

## 2014-03-13 DIAGNOSIS — J441 Chronic obstructive pulmonary disease with (acute) exacerbation: Secondary | ICD-10-CM | POA: Diagnosis not present

## 2014-03-13 DIAGNOSIS — R251 Tremor, unspecified: Secondary | ICD-10-CM | POA: Diagnosis not present

## 2014-03-13 DIAGNOSIS — Z880 Allergy status to penicillin: Secondary | ICD-10-CM | POA: Diagnosis not present

## 2014-03-13 DIAGNOSIS — R0602 Shortness of breath: Secondary | ICD-10-CM | POA: Diagnosis present

## 2014-03-13 DIAGNOSIS — Z792 Long term (current) use of antibiotics: Secondary | ICD-10-CM | POA: Diagnosis not present

## 2014-03-13 DIAGNOSIS — F419 Anxiety disorder, unspecified: Secondary | ICD-10-CM | POA: Diagnosis not present

## 2014-03-13 DIAGNOSIS — Z87891 Personal history of nicotine dependence: Secondary | ICD-10-CM | POA: Diagnosis not present

## 2014-03-13 DIAGNOSIS — K219 Gastro-esophageal reflux disease without esophagitis: Secondary | ICD-10-CM | POA: Diagnosis not present

## 2014-03-13 LAB — CBC WITH DIFFERENTIAL/PLATELET
Basophils Absolute: 0 10*3/uL (ref 0.0–0.1)
Basophils Relative: 0 % (ref 0–1)
Eosinophils Absolute: 0.1 10*3/uL (ref 0.0–0.7)
Eosinophils Relative: 1 % (ref 0–5)
HEMATOCRIT: 36.1 % (ref 36.0–46.0)
HEMOGLOBIN: 11.4 g/dL — AB (ref 12.0–15.0)
LYMPHS ABS: 2.5 10*3/uL (ref 0.7–4.0)
LYMPHS PCT: 24 % (ref 12–46)
MCH: 27.9 pg (ref 26.0–34.0)
MCHC: 31.6 g/dL (ref 30.0–36.0)
MCV: 88.5 fL (ref 78.0–100.0)
MONO ABS: 0.3 10*3/uL (ref 0.1–1.0)
MONOS PCT: 3 % (ref 3–12)
NEUTROS ABS: 7.5 10*3/uL (ref 1.7–7.7)
Neutrophils Relative %: 72 % (ref 43–77)
Platelets: 213 10*3/uL (ref 150–400)
RBC: 4.08 MIL/uL (ref 3.87–5.11)
RDW: 15.6 % — AB (ref 11.5–15.5)
WBC: 10.4 10*3/uL (ref 4.0–10.5)

## 2014-03-13 LAB — PRO B NATRIURETIC PEPTIDE: Pro B Natriuretic peptide (BNP): 496.6 pg/mL — ABNORMAL HIGH (ref 0–450)

## 2014-03-13 LAB — COMPREHENSIVE METABOLIC PANEL
ALT: 7 U/L (ref 0–35)
AST: 16 U/L (ref 0–37)
Albumin: 3.1 g/dL — ABNORMAL LOW (ref 3.5–5.2)
Alkaline Phosphatase: 90 U/L (ref 39–117)
Anion gap: 10 (ref 5–15)
BILIRUBIN TOTAL: 0.3 mg/dL (ref 0.3–1.2)
BUN: 8 mg/dL (ref 6–23)
CHLORIDE: 98 meq/L (ref 96–112)
CO2: 35 meq/L — AB (ref 19–32)
CREATININE: 0.8 mg/dL (ref 0.50–1.10)
Calcium: 9 mg/dL (ref 8.4–10.5)
GFR, EST AFRICAN AMERICAN: 81 mL/min — AB (ref >90)
GFR, EST NON AFRICAN AMERICAN: 70 mL/min — AB (ref >90)
GLUCOSE: 156 mg/dL — AB (ref 70–99)
Potassium: 2.9 mEq/L — CL (ref 3.7–5.3)
Sodium: 143 mEq/L (ref 137–147)
Total Protein: 7.2 g/dL (ref 6.0–8.3)

## 2014-03-13 LAB — TROPONIN I

## 2014-03-13 MED ORDER — PREDNISONE 10 MG PO TABS: 20.0000 mg | ORAL_TABLET | Freq: Every day | ORAL | Status: DC

## 2014-03-13 MED ORDER — POTASSIUM CHLORIDE 20 MEQ/15ML (10%) PO LIQD
40.0000 meq | Freq: Once | ORAL | Status: AC
  Administered 2014-03-13: 40 meq via ORAL
  Filled 2014-03-13: qty 30

## 2014-03-13 MED ORDER — VITAMIN D (ERGOCALCIFEROL) 1.25 MG (50000 UNIT) PO CAPS: 50000.0000 [IU] | ORAL_CAPSULE | ORAL | Status: DC

## 2014-03-13 MED ORDER — SULFAMETHOXAZOLE-TRIMETHOPRIM 800-160 MG PO TABS: 1.0000 | ORAL_TABLET | Freq: Two times a day (BID) | ORAL | Status: DC

## 2014-03-13 NOTE — ED Notes (Signed)
MD at bedside. 

## 2014-03-13 NOTE — ED Notes (Signed)
Pt became SOB yesterday with productive cough. Pt has been using her albuterol, last one this morning. Given 2x of  5 mg albuterol by EMS and 125 solumedrol.

## 2014-03-13 NOTE — Discharge Instructions (Signed)
Follow up with your md next week. °
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
## 2014-03-13 NOTE — ED Notes (Signed)
CRITICAL VALUE ALERT  Critical value received:  Potassium 2.9  Date of notification:  03/13/14  Time of notification:  8118  Critical value read back:Yes.    Nurse who received alert:  rminter  MD notified (1st page):  Dr. Roderic Palau  Time of first page:  25  MD notified (2nd page):  Time of second page:  Responding MD:  Dr. Roderic Palau  Time MD responded:  1225

## 2014-03-14 ENCOUNTER — Other Ambulatory Visit: Payer: Self-pay

## 2014-03-14 MED ORDER — POTASSIUM CHLORIDE CRYS ER 20 MEQ PO TBCR: 20.0000 meq | EXTENDED_RELEASE_TABLET | Freq: Every day | ORAL | Status: DC

## 2014-03-26 ENCOUNTER — Ambulatory Visit: Payer: Medicare PPO | Admitting: Family Medicine

## 2014-03-26 ENCOUNTER — Other Ambulatory Visit: Payer: Self-pay | Admitting: Family Medicine

## 2014-03-26 ENCOUNTER — Encounter (INDEPENDENT_AMBULATORY_CARE_PROVIDER_SITE_OTHER): Payer: Self-pay

## 2014-03-26 ENCOUNTER — Encounter: Payer: Self-pay | Admitting: Family Medicine

## 2014-03-26 VITALS — BP 136/78 | HR 84 | Resp 18 | Ht 63.0 in | Wt 180.0 lb

## 2014-03-26 DIAGNOSIS — Z1231 Encounter for screening mammogram for malignant neoplasm of breast: Secondary | ICD-10-CM

## 2014-03-26 DIAGNOSIS — G309 Alzheimer's disease, unspecified: Secondary | ICD-10-CM

## 2014-03-26 DIAGNOSIS — F32A Depression, unspecified: Secondary | ICD-10-CM

## 2014-03-26 DIAGNOSIS — N3000 Acute cystitis without hematuria: Secondary | ICD-10-CM

## 2014-03-26 DIAGNOSIS — F419 Anxiety disorder, unspecified: Secondary | ICD-10-CM

## 2014-03-26 DIAGNOSIS — R634 Abnormal weight loss: Secondary | ICD-10-CM

## 2014-03-26 DIAGNOSIS — I10 Essential (primary) hypertension: Secondary | ICD-10-CM

## 2014-03-26 DIAGNOSIS — D539 Nutritional anemia, unspecified: Secondary | ICD-10-CM

## 2014-03-26 DIAGNOSIS — F329 Major depressive disorder, single episode, unspecified: Secondary | ICD-10-CM

## 2014-03-26 DIAGNOSIS — K219 Gastro-esophageal reflux disease without esophagitis: Secondary | ICD-10-CM

## 2014-03-26 DIAGNOSIS — F418 Other specified anxiety disorders: Secondary | ICD-10-CM

## 2014-03-26 DIAGNOSIS — M5417 Radiculopathy, lumbosacral region: Secondary | ICD-10-CM

## 2014-03-26 DIAGNOSIS — M5416 Radiculopathy, lumbar region: Secondary | ICD-10-CM

## 2014-03-26 DIAGNOSIS — K140 Glossitis: Secondary | ICD-10-CM

## 2014-03-26 DIAGNOSIS — F0280 Dementia in other diseases classified elsewhere without behavioral disturbance: Secondary | ICD-10-CM

## 2014-03-26 DIAGNOSIS — E039 Hypothyroidism, unspecified: Secondary | ICD-10-CM

## 2014-03-26 DIAGNOSIS — R7301 Impaired fasting glucose: Secondary | ICD-10-CM

## 2014-03-26 DIAGNOSIS — J45909 Unspecified asthma, uncomplicated: Secondary | ICD-10-CM

## 2014-03-26 LAB — POCT URINALYSIS DIPSTICK
Glucose, UA: NEGATIVE
Ketones, UA: NEGATIVE
Nitrite, UA: NEGATIVE
PROTEIN UA: NEGATIVE
RBC UA: NEGATIVE
Spec Grav, UA: >=1.03
UROBILINOGEN UA: 0.2
pH, UA: 5.5

## 2014-03-26 MED ORDER — FIRST-DUKES MOUTHWASH MT SUSP: OROMUCOSAL | Status: DC

## 2014-03-26 NOTE — Patient Instructions (Addendum)
F/u in mid January, call if you need me before  Urine is being checked for infection, antibiotic will be prescribed when culture report is available if we have to test further  I am concerned about weight loss  CBC, and iron and ferritin and B12 and folate levels, HBA1C and TSh and chem 7 today  Mouthwash three times daily gargle , swish and swallow ia prescribed for 10 days   You need the mammogram and will get appointment at checkout, this is very important, [past due since January  You are being referred to Dr Laural Golden for unintentional weight loss and anemia

## 2014-03-26 NOTE — Progress Notes (Signed)
   Subjective:    Patient ID: [name], female    DOB: 01/30/1935, 79 y.o.   MRN: [id number]  HPI Pt in for Ed f/u from 10/21 with a c/o cough and shortnesss of breath, CXR at the time showed no infection  C/o chronic diarrheah as well as burning and urinary frequency. Denies excess cough and dyspnea. Denies fever or chill. C/o sore tongue which makes eating painful, she has also lost weight which is concerning C/o worsening tremor of her hands which makes feeding difficult, she has also lost weight   Review of Systems See HPI Denies recent fever or chills. Denies sinus pressure, nasal congestion, ear pain or sore throat. Denies chest congestion, productive cough or wheezing. Denies chest pains, palpitations and leg swelling Denies abdominal pain, nausea, vomiting, or constipation.   . Chronic back and lower extr has extremity , numbness, and  Tingling.and tremors which make feeding herself difficult Denies uncontrolled  depression, anxiety or insomnia. Denies skin break down or rash.        Objective:   Physical Exam BP 136/78 mmHg  Pulse 84  Resp 18  Ht 5\' 3"  (1.6 m)  Wt 180 lb 0.6 oz (81.666 kg)  BMI 31.90 kg/m2  SpO2 90% Patient alert and oriented and in no cardiopulmonary distress.  HEENT: No facial asymmetry, EOMI,   oropharynx pink and moist.  Neck supple no JVD, no mass. TM clear. Tongue eryhtematous Chest: Clear to auscultation bilaterally.  CVS: S1, S2 no murmurs, no S3.Regular rate.  ABD: Soft non tender.No renal angle or suprapubic tenderness  Ext: No edema  MS: decreased ROM spine, shoulders, hips and knees.  Skin: Intact, no ulcerations or rash noted.  Psych: Good eye contact, normal affect. Memory intact mildly  anxious not  depressed appearing.  CNS: CN 2-12 intact, power,  normal throughout.no focal deficits noted.        Assessment & Plan:  Glossitis Dukes mouthwash prescribed   Anxiety and depression Controlled, no change  in medication Most of her assistance is from a church  Member who now brings her to medical appts Financial stress present also   Essential hypertension Controlled, no change in medication    Intrinsic asthma Controlled, no change in medication    ALZHEIMERS DISEASE Currently stable will continue medication as prescribed   Acute cystitis without hematuria Symptomatic with abnormal UA , will await c/s before treating   GERD without esophagitis Controlled, no change in medication    Lumbar back pain with radiculopathy affecting left lower extremity Unchanged, currently controlled, no acute flare of pain

## 2014-03-27 LAB — FERRITIN: Ferritin: 30 ng/mL (ref 10–291)

## 2014-03-27 LAB — CBC
HCT: 41.5 % (ref 36.0–46.0)
HEMOGLOBIN: 13.1 g/dL (ref 12.0–15.0)
MCH: 28 pg (ref 26.0–34.0)
MCHC: 31.6 g/dL (ref 30.0–36.0)
MCV: 88.7 fL (ref 78.0–100.0)
PLATELETS: 386 10*3/uL (ref 150–400)
RBC: 4.68 MIL/uL (ref 3.87–5.11)
RDW: 15.9 % — ABNORMAL HIGH (ref 11.5–15.5)
WBC: 9.8 10*3/uL (ref 4.0–10.5)

## 2014-03-27 LAB — T3, FREE: T3, Free: 3.5 pg/mL (ref 2.3–4.2)

## 2014-03-27 LAB — BASIC METABOLIC PANEL
BUN: 9 mg/dL (ref 6–23)
CHLORIDE: 100 meq/L (ref 96–112)
CO2: 30 mEq/L (ref 19–32)
Calcium: 9.4 mg/dL (ref 8.4–10.5)
Creat: 0.8 mg/dL (ref 0.50–1.10)
Glucose, Bld: 81 mg/dL (ref 70–99)
Potassium: 4.9 mEq/L (ref 3.5–5.3)
Sodium: 139 mEq/L (ref 135–145)

## 2014-03-27 LAB — FOLATE: Folate: 6.8 ng/mL

## 2014-03-27 LAB — HEMOGLOBIN A1C
HEMOGLOBIN A1C: 5.8 % — AB (ref <5.7)
Mean Plasma Glucose: 120 mg/dL — ABNORMAL HIGH (ref <117)

## 2014-03-27 LAB — T4, FREE: Free T4: 1.38 ng/dL (ref 0.80–1.80)

## 2014-03-27 LAB — VITAMIN B12: VITAMIN B 12: 338 pg/mL (ref 211–911)

## 2014-03-27 LAB — TSH: TSH: 6.934 u[IU]/mL — ABNORMAL HIGH (ref 0.350–4.500)

## 2014-03-27 LAB — IRON: Iron: 55 ug/dL (ref 42–145)

## 2014-03-29 LAB — URINE CULTURE: Colony Count: 75000

## 2014-04-02 ENCOUNTER — Other Ambulatory Visit: Payer: Self-pay | Admitting: Family Medicine

## 2014-04-03 MED ORDER — LEVOFLOXACIN 500 MG PO TABS: 500.0000 mg | ORAL_TABLET | Freq: Every day | ORAL | Status: DC

## 2014-04-08 ENCOUNTER — Other Ambulatory Visit: Payer: Self-pay | Admitting: Family Medicine

## 2014-04-09 ENCOUNTER — Ambulatory Visit (HOSPITAL_COMMUNITY): Payer: Medicare PPO

## 2014-04-09 ENCOUNTER — Other Ambulatory Visit: Payer: Self-pay | Admitting: Family Medicine

## 2014-04-09 ENCOUNTER — Encounter (HOSPITAL_COMMUNITY): Payer: Medicare PPO

## 2014-04-11 ENCOUNTER — Telehealth: Payer: Self-pay | Admitting: Family Medicine

## 2014-04-11 ENCOUNTER — Other Ambulatory Visit (HOSPITAL_COMMUNITY): Payer: Medicare PPO

## 2014-04-11 NOTE — Telephone Encounter (Signed)
noted 

## 2014-04-15 ENCOUNTER — Other Ambulatory Visit: Payer: Self-pay | Admitting: Family Medicine

## 2014-04-17 ENCOUNTER — Other Ambulatory Visit (HOSPITAL_COMMUNITY): Payer: Medicare PPO

## 2014-04-17 ENCOUNTER — Encounter (HOSPITAL_COMMUNITY): Payer: Medicare PPO

## 2014-04-23 ENCOUNTER — Ambulatory Visit (HOSPITAL_COMMUNITY)
Admission: RE | Admit: 2014-04-23 | Discharge: 2014-04-23 | Disposition: A | Discharge status: Home / Self Care | Payer: Medicare PPO | Source: Ambulatory Visit | Attending: Family Medicine | Admitting: Family Medicine

## 2014-04-23 ENCOUNTER — Encounter: Payer: Self-pay | Admitting: Family Medicine

## 2014-04-23 ENCOUNTER — Other Ambulatory Visit: Payer: Self-pay | Admitting: Family Medicine

## 2014-04-23 DIAGNOSIS — E039 Hypothyroidism, unspecified: Secondary | ICD-10-CM

## 2014-04-23 DIAGNOSIS — Z09 Encounter for follow-up examination after completed treatment for conditions other than malignant neoplasm: Secondary | ICD-10-CM

## 2014-04-23 DIAGNOSIS — R911 Solitary pulmonary nodule: Secondary | ICD-10-CM | POA: Insufficient documentation

## 2014-04-23 DIAGNOSIS — E041 Nontoxic single thyroid nodule: Secondary | ICD-10-CM | POA: Insufficient documentation

## 2014-04-23 DIAGNOSIS — Z1231 Encounter for screening mammogram for malignant neoplasm of breast: Secondary | ICD-10-CM | POA: Insufficient documentation

## 2014-04-23 DIAGNOSIS — D241 Benign neoplasm of right breast: Secondary | ICD-10-CM | POA: Insufficient documentation

## 2014-05-10 ENCOUNTER — Other Ambulatory Visit: Payer: Self-pay | Admitting: Family Medicine

## 2014-05-19 ENCOUNTER — Other Ambulatory Visit: Payer: Self-pay | Admitting: Family Medicine

## 2014-06-06 ENCOUNTER — Ambulatory Visit (INDEPENDENT_AMBULATORY_CARE_PROVIDER_SITE_OTHER): Payer: 59 | Admitting: Family Medicine

## 2014-06-06 ENCOUNTER — Encounter: Payer: Self-pay | Admitting: Family Medicine

## 2014-06-06 VITALS — BP 130/80 | HR 63 | Resp 18 | Ht 63.0 in | Wt 183.0 lb

## 2014-06-06 DIAGNOSIS — Z1211 Encounter for screening for malignant neoplasm of colon: Secondary | ICD-10-CM

## 2014-06-06 DIAGNOSIS — J4520 Mild intermittent asthma, uncomplicated: Secondary | ICD-10-CM

## 2014-06-06 DIAGNOSIS — Z23 Encounter for immunization: Secondary | ICD-10-CM | POA: Diagnosis not present

## 2014-06-06 DIAGNOSIS — F419 Anxiety disorder, unspecified: Secondary | ICD-10-CM

## 2014-06-06 DIAGNOSIS — F329 Major depressive disorder, single episode, unspecified: Secondary | ICD-10-CM

## 2014-06-06 DIAGNOSIS — M5417 Radiculopathy, lumbosacral region: Secondary | ICD-10-CM

## 2014-06-06 DIAGNOSIS — M5416 Radiculopathy, lumbar region: Secondary | ICD-10-CM

## 2014-06-06 DIAGNOSIS — I10 Essential (primary) hypertension: Secondary | ICD-10-CM

## 2014-06-06 DIAGNOSIS — J3089 Other allergic rhinitis: Secondary | ICD-10-CM

## 2014-06-06 DIAGNOSIS — F418 Other specified anxiety disorders: Secondary | ICD-10-CM

## 2014-06-06 DIAGNOSIS — K589 Irritable bowel syndrome without diarrhea: Secondary | ICD-10-CM

## 2014-06-06 DIAGNOSIS — R7301 Impaired fasting glucose: Secondary | ICD-10-CM

## 2014-06-06 DIAGNOSIS — R251 Tremor, unspecified: Secondary | ICD-10-CM

## 2014-06-06 DIAGNOSIS — E559 Vitamin D deficiency, unspecified: Secondary | ICD-10-CM

## 2014-06-06 DIAGNOSIS — E041 Nontoxic single thyroid nodule: Secondary | ICD-10-CM

## 2014-06-06 LAB — HEMOCCULT GUIAC POC 1CARD (OFFICE): FECAL OCCULT BLD: NEGATIVE

## 2014-06-06 MED ORDER — DIPHENOXYLATE-ATROPINE 2.5-0.025 MG PO TABS: ORAL_TABLET | ORAL | Status: DC

## 2014-06-06 MED ORDER — KETOROLAC TROMETHAMINE 60 MG/2ML IM SOLN
60.0000 mg | Freq: Once | INTRAMUSCULAR | Status: AC
  Administered 2014-06-06: 60 mg via INTRAMUSCULAR

## 2014-06-06 NOTE — Patient Instructions (Addendum)
Annual wellness in 4 month, call if you need me before  Toradol in office for back and left leg pain  Prevnar today  New for diarheah is lomotil, one daily, do not take imodium with this    Rectal exam today  Fasting lipid, cmp, hBa1C and TSh and vit D in 4 month

## 2014-06-06 NOTE — Progress Notes (Signed)
   Subjective:    Patient ID: Amy [name], female    DOB: 1934-07-06, 80 y.o.   MRN: [id number]  HPI Amy [name] is here for follow up and re-evaluation of chronic medical conditions, medication management and review of any available recent lab and radiology data.  Preventive health is updated, specifically  Cancer screening and Immunization.   Had thyroid US and mammogram, she has a small thyroid nodule, too small to bx, will re image next year, also she has been started on metoprolol for tremor with very good results. Amy [name] denies any adverse reactions to current medications since Amy last visit.  Increased financial stress with little family support, but trying her best Put off Amy colonoscopy , will readdress Amy need Requests pain shot today for back and left leg pain     Review of Systems See HPI Denies recent fever or chills. Denies sinus pressure, nasal congestion, ear pain or sore throat. Denies chest congestion, productive cough or wheezing. Denies chest pains, palpitations and leg swelling Chronic ,diarrhea unchanged.   Denies dysuria, frequency, hesitancy or incontinence.  Denies headaches, seizures, numbness, or tingling. Denies depression, has increased  anxiety denies  insomnia. Denies skin break down or rash.        Objective:   Physical Exam BP 130/80 mmHg  Pulse 63  Resp 18  Ht 5\' 3"  (1.6 m)  Wt 183 lb 0.6 oz (83.026 kg)  BMI 32.43 kg/m2  SpO2 90% Patient alert and oriented and in no cardiopulmonary distress.  HEENT: No facial asymmetry, EOMI,   oropharynx pink and moist.  Neck supple no JVD, no mass.  Chest: Clear to auscultation bilaterally.  CVS: S1, S2 no murmurs, no S3.Regular rate.  ABD: Soft non tender. No organomegaly or mass, normal BS Rectal: no mass, heme negative stool  Ext: No edema  MS: Adequate ROM spine, shoulders, hips and knees.  Skin: Intact, no ulcerations or rash noted.  Psych: Good eye contact, normal affect. Memory  intact not anxious or depressed appearing.  CNS: CN 2-12 intact, power,  normal throughout.no focal deficits noted.        Assessment & Plan:  Essential hypertension Controlled, no change in medication    Intrinsic asthma Controlled, no change in medication    Allergic rhinitis Controlled, no change in medication    IBS (irritable bowel syndrome) diarheah predominant , not much benefit from imodium, trial of daily lomotil as needed, [name] to d/c OTC prep while on this   Lumbar back pain with radiculopathy affecting left lower extremity Increased pain , toradol in office   Tremor Improved on propranolol per neurology, [name] to continue same   Anxiety and depression Stable though increased financial stress currently but working through [employer]  member is major support   Solitary nodule of right lobe of thyroid rept tSH in 4 month, will rept [place] in 1 year   Need for vaccination with 13-polyvalent pneumococcal conjugate vaccine Vaccine administered at visit.

## 2014-06-09 DIAGNOSIS — Z23 Encounter for immunization: Secondary | ICD-10-CM | POA: Insufficient documentation

## 2014-06-09 DIAGNOSIS — Z1211 Encounter for screening for malignant neoplasm of colon: Secondary | ICD-10-CM | POA: Insufficient documentation

## 2014-06-09 NOTE — Assessment & Plan Note (Signed)
Controlled, no change in medication  

## 2014-06-09 NOTE — Assessment & Plan Note (Signed)
Stable though increased financial stress currently but working through [employer]  member is major support

## 2014-06-09 NOTE — Assessment & Plan Note (Signed)
Controlled, no change in medication Most of her assistance is from a church  Member who now brings her to medical appts Financial stress present also

## 2014-06-09 NOTE — Assessment & Plan Note (Signed)
Dukes mouthwash prescribed 

## 2014-06-09 NOTE — Assessment & Plan Note (Signed)
Heme negative stool, no palpable or visible mass

## 2014-06-09 NOTE — Assessment & Plan Note (Signed)
Currently stable will continue medication as prescribed

## 2014-06-09 NOTE — Assessment & Plan Note (Signed)
Unchanged, currently controlled, no acute flare of pain

## 2014-06-09 NOTE — Assessment & Plan Note (Signed)
diarheah predominant , not much benefit from imodium, trial of daily lomotil as needed, pt to d/c OTC prep while on this

## 2014-06-09 NOTE — Assessment & Plan Note (Signed)
Vaccine administered at visit.  

## 2014-06-09 NOTE — Assessment & Plan Note (Signed)
Improved on propranolol per neurology, pt to continue same

## 2014-06-09 NOTE — Assessment & Plan Note (Signed)
Symptomatic with abnormal UA , will await c/s before treating

## 2014-06-09 NOTE — Assessment & Plan Note (Signed)
Increased pain , toradol in office

## 2014-06-09 NOTE — Assessment & Plan Note (Signed)
rept tSH in 4 month, will rept [place] in 1 year

## 2014-06-10 ENCOUNTER — Other Ambulatory Visit: Payer: Self-pay | Admitting: Family Medicine

## 2014-06-12 ENCOUNTER — Other Ambulatory Visit: Payer: Self-pay | Admitting: Family Medicine

## 2014-06-24 ENCOUNTER — Emergency Department (HOSPITAL_COMMUNITY): Payer: Medicare Other

## 2014-06-24 ENCOUNTER — Encounter (HOSPITAL_COMMUNITY): Payer: Self-pay

## 2014-06-24 ENCOUNTER — Inpatient Hospital Stay (HOSPITAL_COMMUNITY)
Admission: EM | Admit: 2014-06-24 | Discharge: 2014-06-27 | DRG: 202 | Disposition: A | Discharge status: Home / Self Care | Payer: Medicare Other | Attending: Internal Medicine | Admitting: Internal Medicine

## 2014-06-24 DIAGNOSIS — M1990 Unspecified osteoarthritis, unspecified site: Secondary | ICD-10-CM | POA: Diagnosis present

## 2014-06-24 DIAGNOSIS — I10 Essential (primary) hypertension: Secondary | ICD-10-CM | POA: Diagnosis not present

## 2014-06-24 DIAGNOSIS — J441 Chronic obstructive pulmonary disease with (acute) exacerbation: Secondary | ICD-10-CM | POA: Diagnosis present

## 2014-06-24 DIAGNOSIS — I288 Other diseases of pulmonary vessels: Secondary | ICD-10-CM | POA: Diagnosis not present

## 2014-06-24 DIAGNOSIS — R0902 Hypoxemia: Secondary | ICD-10-CM | POA: Diagnosis not present

## 2014-06-24 DIAGNOSIS — F418 Other specified anxiety disorders: Secondary | ICD-10-CM | POA: Diagnosis not present

## 2014-06-24 DIAGNOSIS — Z9841 Cataract extraction status, right eye: Secondary | ICD-10-CM

## 2014-06-24 DIAGNOSIS — Z87891 Personal history of nicotine dependence: Secondary | ICD-10-CM | POA: Diagnosis not present

## 2014-06-24 DIAGNOSIS — R531 Weakness: Secondary | ICD-10-CM | POA: Diagnosis not present

## 2014-06-24 DIAGNOSIS — J45901 Unspecified asthma with (acute) exacerbation: Principal | ICD-10-CM | POA: Diagnosis present

## 2014-06-24 DIAGNOSIS — J9811 Atelectasis: Secondary | ICD-10-CM | POA: Diagnosis not present

## 2014-06-24 DIAGNOSIS — E876 Hypokalemia: Secondary | ICD-10-CM | POA: Diagnosis present

## 2014-06-24 DIAGNOSIS — Z885 Allergy status to narcotic agent status: Secondary | ICD-10-CM

## 2014-06-24 DIAGNOSIS — R0600 Dyspnea, unspecified: Secondary | ICD-10-CM | POA: Diagnosis not present

## 2014-06-24 DIAGNOSIS — F0280 Dementia in other diseases classified elsewhere without behavioral disturbance: Secondary | ICD-10-CM | POA: Diagnosis present

## 2014-06-24 DIAGNOSIS — F419 Anxiety disorder, unspecified: Secondary | ICD-10-CM | POA: Diagnosis not present

## 2014-06-24 DIAGNOSIS — K219 Gastro-esophageal reflux disease without esophagitis: Secondary | ICD-10-CM | POA: Diagnosis present

## 2014-06-24 DIAGNOSIS — Z880 Allergy status to penicillin: Secondary | ICD-10-CM | POA: Diagnosis not present

## 2014-06-24 DIAGNOSIS — F329 Major depressive disorder, single episode, unspecified: Secondary | ICD-10-CM | POA: Diagnosis present

## 2014-06-24 DIAGNOSIS — Z6832 Body mass index (BMI) 32.0-32.9, adult: Secondary | ICD-10-CM

## 2014-06-24 DIAGNOSIS — J45909 Unspecified asthma, uncomplicated: Secondary | ICD-10-CM | POA: Diagnosis present

## 2014-06-24 DIAGNOSIS — Z9049 Acquired absence of other specified parts of digestive tract: Secondary | ICD-10-CM | POA: Diagnosis present

## 2014-06-24 DIAGNOSIS — G309 Alzheimer's disease, unspecified: Secondary | ICD-10-CM | POA: Diagnosis present

## 2014-06-24 DIAGNOSIS — R5383 Other fatigue: Secondary | ICD-10-CM

## 2014-06-24 DIAGNOSIS — F333 Major depressive disorder, recurrent, severe with psychotic symptoms: Secondary | ICD-10-CM | POA: Diagnosis not present

## 2014-06-24 DIAGNOSIS — I6782 Cerebral ischemia: Secondary | ICD-10-CM | POA: Diagnosis not present

## 2014-06-24 DIAGNOSIS — Z9842 Cataract extraction status, left eye: Secondary | ICD-10-CM

## 2014-06-24 DIAGNOSIS — E669 Obesity, unspecified: Secondary | ICD-10-CM | POA: Diagnosis not present

## 2014-06-24 DIAGNOSIS — F32A Depression, unspecified: Secondary | ICD-10-CM | POA: Diagnosis present

## 2014-06-24 DIAGNOSIS — J449 Chronic obstructive pulmonary disease, unspecified: Secondary | ICD-10-CM | POA: Diagnosis not present

## 2014-06-24 LAB — BASIC METABOLIC PANEL
Anion gap: 5 (ref 5–15)
BUN: 10 mg/dL (ref 6–23)
CALCIUM: 8.1 mg/dL — AB (ref 8.4–10.5)
CO2: 36 mmol/L — ABNORMAL HIGH (ref 19–32)
CREATININE: 0.85 mg/dL (ref 0.50–1.10)
Chloride: 99 mmol/L (ref 96–112)
GFR, EST AFRICAN AMERICAN: 75 mL/min — AB (ref >90)
GFR, EST NON AFRICAN AMERICAN: 64 mL/min — AB (ref >90)
Glucose, Bld: 99 mg/dL (ref 70–99)
Potassium: 2.5 mmol/L — CL (ref 3.5–5.1)
Sodium: 140 mmol/L (ref 135–145)

## 2014-06-24 LAB — CBC WITH DIFFERENTIAL/PLATELET
Basophils Absolute: 0 10*3/uL (ref 0.0–0.1)
Basophils Relative: 0 % (ref 0–1)
Eosinophils Absolute: 0.1 10*3/uL (ref 0.0–0.7)
Eosinophils Relative: 1 % (ref 0–5)
HEMATOCRIT: 35.4 % — AB (ref 36.0–46.0)
Hemoglobin: 10.9 g/dL — ABNORMAL LOW (ref 12.0–15.0)
LYMPHS PCT: 33 % (ref 12–46)
Lymphs Abs: 2.9 10*3/uL (ref 0.7–4.0)
MCH: 28.5 pg (ref 26.0–34.0)
MCHC: 30.8 g/dL (ref 30.0–36.0)
MCV: 92.7 fL (ref 78.0–100.0)
MONO ABS: 0.7 10*3/uL (ref 0.1–1.0)
Monocytes Relative: 8 % (ref 3–12)
NEUTROS ABS: 5 10*3/uL (ref 1.7–7.7)
NEUTROS PCT: 58 % (ref 43–77)
Platelets: 228 10*3/uL (ref 150–400)
RBC: 3.82 MIL/uL — ABNORMAL LOW (ref 3.87–5.11)
RDW: 14.3 % (ref 11.5–15.5)
WBC: 8.7 10*3/uL (ref 4.0–10.5)

## 2014-06-24 LAB — URINALYSIS, ROUTINE W REFLEX MICROSCOPIC
BILIRUBIN URINE: NEGATIVE
Glucose, UA: NEGATIVE mg/dL
Hgb urine dipstick: NEGATIVE
Ketones, ur: NEGATIVE mg/dL
Leukocytes, UA: NEGATIVE
Nitrite: NEGATIVE
PH: 5.5 (ref 5.0–8.0)
PROTEIN: NEGATIVE mg/dL
Specific Gravity, Urine: >1.03 — ABNORMAL HIGH (ref 1.005–1.030)
Urobilinogen, UA: 0.2 mg/dL (ref 0.0–1.0)

## 2014-06-24 LAB — TROPONIN I: Troponin I: 0.03 ng/mL (ref <0.031)

## 2014-06-24 MED ORDER — CLOTRIMAZOLE 1 % EX CREA
TOPICAL_CREAM | Freq: Two times a day (BID) | CUTANEOUS | Status: DC
  Administered 2014-06-24 – 2014-06-25 (×2): via TOPICAL
  Administered 2014-06-25: 1 via TOPICAL
  Administered 2014-06-26: 10:00:00 via TOPICAL
  Filled 2014-06-24: qty 15

## 2014-06-24 MED ORDER — ONDANSETRON HCL 4 MG/2ML IJ SOLN: 4.0000 mg | Freq: Four times a day (QID) | INTRAMUSCULAR | Status: DC | PRN

## 2014-06-24 MED ORDER — FUROSEMIDE 20 MG PO TABS
20.0000 mg | ORAL_TABLET | Freq: Every day | ORAL | Status: DC
  Administered 2014-06-24 – 2014-06-27 (×4): 20 mg via ORAL
  Filled 2014-06-24 (×4): qty 1

## 2014-06-24 MED ORDER — ALPRAZOLAM 0.5 MG PO TABS: 0.5000 mg | ORAL_TABLET | Freq: Two times a day (BID) | ORAL | Status: DC | PRN

## 2014-06-24 MED ORDER — QUETIAPINE FUMARATE 25 MG PO TABS
ORAL_TABLET | ORAL | Status: AC
  Filled 2014-06-24: qty 1

## 2014-06-24 MED ORDER — DONEPEZIL HCL 5 MG PO TABS
ORAL_TABLET | ORAL | Status: AC
  Filled 2014-06-24: qty 2

## 2014-06-24 MED ORDER — SODIUM CHLORIDE 0.9 % IJ SOLN
3.0000 mL | Freq: Two times a day (BID) | INTRAMUSCULAR | Status: DC
  Administered 2014-06-25 – 2014-06-27 (×5): 3 mL via INTRAVENOUS

## 2014-06-24 MED ORDER — POTASSIUM CHLORIDE CRYS ER 20 MEQ PO TBCR: 20.0000 meq | EXTENDED_RELEASE_TABLET | Freq: Every day | ORAL | Status: DC

## 2014-06-24 MED ORDER — PANTOPRAZOLE SODIUM 40 MG PO TBEC
40.0000 mg | DELAYED_RELEASE_TABLET | Freq: Every day | ORAL | Status: DC
  Administered 2014-06-24 – 2014-06-27 (×4): 40 mg via ORAL
  Filled 2014-06-24 (×4): qty 1

## 2014-06-24 MED ORDER — MOMETASONE FURO-FORMOTEROL FUM 100-5 MCG/ACT IN AERO
2.0000 | INHALATION_SPRAY | Freq: Two times a day (BID) | RESPIRATORY_TRACT | Status: DC
  Administered 2014-06-25 – 2014-06-27 (×4): 2 via RESPIRATORY_TRACT
  Filled 2014-06-24: qty 8.8

## 2014-06-24 MED ORDER — HEPARIN SODIUM (PORCINE) 5000 UNIT/ML IJ SOLN
5000.0000 [IU] | Freq: Three times a day (TID) | INTRAMUSCULAR | Status: DC
  Administered 2014-06-24 – 2014-06-27 (×8): 5000 [IU] via SUBCUTANEOUS
  Filled 2014-06-24 (×8): qty 1

## 2014-06-24 MED ORDER — ALBUTEROL SULFATE (2.5 MG/3ML) 0.083% IN NEBU: 2.5000 mg | INHALATION_SOLUTION | Freq: Four times a day (QID) | RESPIRATORY_TRACT | Status: DC | PRN

## 2014-06-24 MED ORDER — CETYLPYRIDINIUM CHLORIDE 0.05 % MT LIQD
7.0000 mL | Freq: Two times a day (BID) | OROMUCOSAL | Status: DC
  Administered 2014-06-24 – 2014-06-27 (×6): 7 mL via OROMUCOSAL

## 2014-06-24 MED ORDER — SODIUM CHLORIDE 0.9 % IJ SOLN: 3.0000 mL | INTRAMUSCULAR | Status: DC | PRN

## 2014-06-24 MED ORDER — SODIUM CHLORIDE 0.9 % IV SOLN: 250.0000 mL | INTRAVENOUS | Status: DC | PRN

## 2014-06-24 MED ORDER — TRAMADOL HCL 50 MG PO TABS
50.0000 mg | ORAL_TABLET | Freq: Two times a day (BID) | ORAL | Status: DC
  Administered 2014-06-24 – 2014-06-27 (×6): 50 mg via ORAL
  Filled 2014-06-24 (×6): qty 1

## 2014-06-24 MED ORDER — ALBUTEROL SULFATE (2.5 MG/3ML) 0.083% IN NEBU
5.0000 mg | INHALATION_SOLUTION | Freq: Once | RESPIRATORY_TRACT | Status: AC
  Administered 2014-06-24: 5 mg via RESPIRATORY_TRACT
  Filled 2014-06-24: qty 6

## 2014-06-24 MED ORDER — POTASSIUM CHLORIDE CRYS ER 20 MEQ PO TBCR
40.0000 meq | EXTENDED_RELEASE_TABLET | Freq: Once | ORAL | Status: AC
  Administered 2014-06-24: 40 meq via ORAL
  Filled 2014-06-24: qty 2

## 2014-06-24 MED ORDER — POTASSIUM CHLORIDE CRYS ER 20 MEQ PO TBCR
40.0000 meq | EXTENDED_RELEASE_TABLET | Freq: Every day | ORAL | Status: DC
  Administered 2014-06-25 – 2014-06-27 (×3): 40 meq via ORAL
  Filled 2014-06-24 (×3): qty 2

## 2014-06-24 MED ORDER — SODIUM CHLORIDE 0.9 % IJ SOLN
3.0000 mL | Freq: Two times a day (BID) | INTRAMUSCULAR | Status: DC
  Administered 2014-06-24 – 2014-06-26 (×2): 3 mL via INTRAVENOUS

## 2014-06-24 MED ORDER — CITALOPRAM HYDROBROMIDE 20 MG PO TABS
20.0000 mg | ORAL_TABLET | Freq: Every day | ORAL | Status: DC
  Administered 2014-06-24 – 2014-06-27 (×4): 20 mg via ORAL
  Filled 2014-06-24 (×7): qty 1

## 2014-06-24 MED ORDER — INFLUENZA VAC SPLIT QUAD 0.5 ML IM SUSY
0.5000 mL | PREFILLED_SYRINGE | INTRAMUSCULAR | Status: DC
  Filled 2014-06-24: qty 0.5

## 2014-06-24 MED ORDER — POTASSIUM CHLORIDE 10 MEQ/100ML IV SOLN
10.0000 meq | Freq: Once | INTRAVENOUS | Status: AC
  Administered 2014-06-24: 10 meq via INTRAVENOUS
  Filled 2014-06-24: qty 100

## 2014-06-24 MED ORDER — ONDANSETRON HCL 4 MG PO TABS: 4.0000 mg | ORAL_TABLET | Freq: Four times a day (QID) | ORAL | Status: DC | PRN

## 2014-06-24 MED ORDER — PROPRANOLOL HCL 20 MG PO TABS
20.0000 mg | ORAL_TABLET | Freq: Two times a day (BID) | ORAL | Status: DC
  Administered 2014-06-25 – 2014-06-26 (×4): 20 mg via ORAL
  Filled 2014-06-24 (×6): qty 1

## 2014-06-24 MED ORDER — QUETIAPINE FUMARATE 25 MG PO TABS
12.5000 mg | ORAL_TABLET | Freq: Every day | ORAL | Status: DC
  Administered 2014-06-24 – 2014-06-26 (×3): 12.5 mg via ORAL
  Filled 2014-06-24 (×4): qty 1

## 2014-06-24 MED ORDER — ALBUTEROL SULFATE (2.5 MG/3ML) 0.083% IN NEBU
5.0000 mg | INHALATION_SOLUTION | Freq: Once | RESPIRATORY_TRACT | Status: AC
Start: 2014-06-24 — End: 2014-06-24
  Administered 2014-06-24: 5 mg via RESPIRATORY_TRACT
  Filled 2014-06-24: qty 6

## 2014-06-24 MED ORDER — LOPERAMIDE HCL 2 MG PO CAPS
2.0000 mg | ORAL_CAPSULE | Freq: Two times a day (BID) | ORAL | Status: DC
  Administered 2014-06-24 – 2014-06-26 (×3): 2 mg via ORAL
  Filled 2014-06-24 (×9): qty 1

## 2014-06-24 MED ORDER — CLOTRIMAZOLE 1 % EX CREA
TOPICAL_CREAM | CUTANEOUS | Status: AC
  Filled 2014-06-24: qty 15

## 2014-06-24 MED ORDER — METHYLPREDNISOLONE SODIUM SUCC 125 MG IJ SOLR
80.0000 mg | Freq: Two times a day (BID) | INTRAMUSCULAR | Status: DC
  Administered 2014-06-24 – 2014-06-25 (×3): 80 mg via INTRAVENOUS
  Filled 2014-06-24 (×4): qty 2

## 2014-06-24 MED ORDER — DONEPEZIL HCL 5 MG PO TABS
10.0000 mg | ORAL_TABLET | Freq: Every day | ORAL | Status: DC
  Administered 2014-06-24 – 2014-06-26 (×3): 10 mg via ORAL
  Filled 2014-06-24: qty 1
  Filled 2014-06-24 (×2): qty 2
  Filled 2014-06-24: qty 1

## 2014-06-24 NOTE — ED Notes (Signed)
MD at bedside. 
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
## 2014-06-24 NOTE — ED Notes (Signed)
EMS called out for SOB, O2 sats 84% upon arrival,  albuterol tx given by EMS, sats up to 98% while at home, sats dropped again  To 83 % room air while  getting on EMS truck; pt. On 3L now sats 90%, pt. C/o fever x few days; denies n/v
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
## 2014-06-24 NOTE — ED Notes (Signed)
CRITICAL VALUE ALERT  Critical value received: potassium 2.5  Date of notification:  06/24/14  Time of notification:  1828  Critical value read back: yes  Nurse who received alert:  Ilda Mori  MD notified (1st page):  wickline  Time of first page:  1832  MD notified (2nd page):  Time of second page:  Responding MD:  [email or web address]  Time MD responded:  [phone]

## 2014-06-24 NOTE — ED Notes (Signed)
Dr Christy Gentles request for RN swallow to be done after CT scan.

## 2014-06-24 NOTE — ED Notes (Signed)
Pt. Place on 4L, sats 93 %

## 2014-06-25 DIAGNOSIS — G309 Alzheimer's disease, unspecified: Secondary | ICD-10-CM

## 2014-06-25 LAB — COMPREHENSIVE METABOLIC PANEL
ALBUMIN: 2.8 g/dL — AB (ref 3.5–5.2)
ALK PHOS: 84 U/L (ref 39–117)
ALT: 13 U/L (ref 0–35)
AST: 23 U/L (ref 0–37)
Anion gap: 9 (ref 5–15)
BUN: 12 mg/dL (ref 6–23)
CALCIUM: 8.6 mg/dL (ref 8.4–10.5)
CHLORIDE: 97 mmol/L (ref 96–112)
CO2: 36 mmol/L — AB (ref 19–32)
CREATININE: 0.66 mg/dL (ref 0.50–1.10)
GFR calc Af Amer: >90 mL/min (ref >90)
GFR calc non Af Amer: 83 mL/min — ABNORMAL LOW (ref >90)
GLUCOSE: 138 mg/dL — AB (ref 70–99)
POTASSIUM: 2.9 mmol/L — AB (ref 3.5–5.1)
Sodium: 142 mmol/L (ref 135–145)
TOTAL PROTEIN: 6.5 g/dL (ref 6.0–8.3)
Total Bilirubin: 0.7 mg/dL (ref 0.3–1.2)

## 2014-06-25 LAB — CBC
HCT: 35.4 % — ABNORMAL LOW (ref 36.0–46.0)
Hemoglobin: 11.2 g/dL — ABNORMAL LOW (ref 12.0–15.0)
MCH: 29.2 pg (ref 26.0–34.0)
MCHC: 31.6 g/dL (ref 30.0–36.0)
MCV: 92.2 fL (ref 78.0–100.0)
PLATELETS: 201 10*3/uL (ref 150–400)
RBC: 3.84 MIL/uL — AB (ref 3.87–5.11)
RDW: 14.1 % (ref 11.5–15.5)
WBC: 4 10*3/uL (ref 4.0–10.5)

## 2014-06-25 MED ORDER — POTASSIUM CHLORIDE 10 MEQ/100ML IV SOLN
10.0000 meq | INTRAVENOUS | Status: AC
  Administered 2014-06-25 (×4): 10 meq via INTRAVENOUS
  Filled 2014-06-25 (×4): qty 100

## 2014-06-25 MED ORDER — POTASSIUM CHLORIDE CRYS ER 20 MEQ PO TBCR
40.0000 meq | EXTENDED_RELEASE_TABLET | ORAL | Status: AC
  Administered 2014-06-25: 40 meq via ORAL
  Filled 2014-06-25: qty 2

## 2014-06-25 NOTE — Progress Notes (Signed)
Nutrition Brief Note  Patient identified on the Malnutrition Screening Tool (MST) Report. Pt admitted with asthma exacerbation.   Wt Readings from Last 15 Encounters:  06/24/14 181 lb 10.5 oz (82.4 kg)  06/06/14 183 lb 0.6 oz (83.026 kg)  03/26/14 180 lb 0.6 oz (81.666 kg)  03/13/14 191 lb (86.637 kg)  01/16/14 192 lb (87.091 kg)  11/07/13 196 lb 9.6 oz (89.177 kg)  10/18/13 198 lb 9.6 oz (90.084 kg)  10/11/13 204 lb 8 oz (92.761 kg)  09/26/13 213 lb 12.8 oz (96.979 kg)  09/19/13 198 lb (89.812 kg)  08/23/13 198 lb (89.812 kg)  06/25/13 198 lb 1.9 oz (89.867 kg)  04/11/13 199 lb 1.9 oz (90.32 kg)  03/08/13 206 lb 1.9 oz (93.495 kg)  02/08/13 201 lb 1.9 oz (91.227 kg)    Body mass index is 32.19 kg/(m^2). Patient meets criteria for obesity class I based on current BMI. Weight loss trend of 5% in past 4 months is not significant for time frame.  Current diet order is Heart Healthy, patient is consuming approximately 75% of meals at this time. Labs and medications reviewed.   No nutrition interventions warranted at this time. If nutrition issues arise, please consult RD.   Colman Cater MS,RD,CSG,LDN Office: [phone] Pager: [phone]

## 2014-06-25 NOTE — Progress Notes (Signed)
UR chart review completed.  

## 2014-06-25 NOTE — Progress Notes (Signed)
Patient/Family oriented to room. Information packet given to patient/family. Admission inpatient armband information verified with patient/family to include name and date of birth and placed on patient arm. Side rails up x 2, fall assessment and education completed with patient/family. Call light within reach. Patient/family able to voice and demonstrate understanding of unit orientation instructions  

## 2014-06-25 NOTE — Evaluation (Signed)
Physical Therapy Evaluation Patient Details Name: RANDEE HUSTON MRN: [id number] DOB: 11-05-1934 Today's Date: 06/25/2014   History of Present Illness  This is a 80 year old lady who presents with onset of dyspnea which started earlier today. She says that she became anxious and became more short of breath after she realized that her legs had become weak and she was not able to get out of her own bed. She denied any specific limb weakness or facial weakness. She also noticed that she had wheezing. There has been no cough, fever, chest pain, palpitations. There is no nausea or vomiting. The patient has a history of anxiety and depression and also possible dementia and possible schizoaffective disorder. In the emergency room, she has been treated with bronchodilators but appears to be not back to her baseline. She is also noticed to have hypokalemia. She is now going to be admitted for further management.  Pt lives at home with her grandson who works daytime hours.  She normally ambulates with a walker and is fairly independent in ADLs.  Clinical Impression   Pt was seen initially for evaluation this afternoon.  She was somewhat tremulous and on 2 L O2.  She was alert and oriented, able to follow all directions.  Muscle strength was found to be decreased overall.  In addition, her LLE has had extensive surgery which has caused a mild knee flexion contracture and her hip appears to have DJD with limited hip flexion and it is fixed in external rotation.  This all results in baseline difficulty with gait.  She required max assist to roll in the bed and max assist to sit at EOB.  She was able to stand to a walker and ambulate 12' with a very labored gait pattern.  Her O2 sat on room air decreased significantly with exertion and was 84% after gait.  I am recommending SNF at d/c but pt is preferring to return home with HHPT.  She states that her family plans to have a CG stay with her daytime hours.    Follow Up  Recommendations Home health PT;SNF;Supervision/Assistance - 24 hour (per family preference)    Equipment Recommendations  None recommended by PT    Recommendations for Other Services OT consult     Precautions / Restrictions Precautions Precautions: Fall Restrictions Weight Bearing Restrictions: No      Mobility  Bed Mobility   Bed Mobility: Rolling;Sidelying to Sit Rolling: Max assist Sidelying to sit: Max assist       General bed mobility comments: HOB flat  Transfers Overall transfer level: Needs assistance Equipment used: Rolling walker (2 wheeled) Transfers: Sit to/from Stand Sit to Stand: Min guard            Ambulation/Gait Ambulation/Gait assistance: Min guard Ambulation Distance (Feet): 12 Feet Assistive device: Rolling walker (2 wheeled) Gait Pattern/deviations: Trunk flexed;Decreased dorsiflexion - left;Antalgic;Decreased stance time - left Gait velocity: L hip in external rotation Gait velocity interpretation: Below normal speed for age/gender General Gait Details: gait is extremely labored...gait tested off of O2 and O2 sat=84%  Stairs            Wheelchair Mobility    Modified Rankin (Stroke Patients Only)       Balance Overall balance assessment: No apparent balance deficits (not formally assessed)  Pertinent Vitals/Pain Pain Assessment: No/denies pain    Home Living Family/patient expects to be discharged to:: Unsure (I am recommending SNF but pt wishes to return home) Living Arrangements: Children               Additional Comments: lives with grandson who works daytime hours...pt states that pt's family plans to get a CG for her while grandson is at work    Prior Function Level of Independence: Independent with assistive device(s)               Hand Dominance        Extremity/Trunk Assessment               Lower Extremity Assessment:  Generalized weakness;LLE deficits/detail (LLE weaker than RLE)   LLE Deficits / Details: pt has had multiple orthopedic procedures to the LLE and has a mild knee flexion contracture, limited hip flexion with hip fixed in external rotation...LLE is about 1" shorter than RLE  Cervical / Trunk Assessment: Kyphotic;Other exceptions  Communication   Communication: No difficulties  Cognition Arousal/Alertness: Awake/alert Behavior During Therapy: WFL for tasks assessed/performed Overall Cognitive Status: Within Functional Limits for tasks assessed                      General Comments      Exercises        Assessment/Plan    PT Assessment Patient needs continued PT services  PT Diagnosis Difficulty walking;Abnormality of gait;Generalized weakness   PT Problem List Decreased strength;Decreased range of motion;Decreased activity tolerance;Decreased mobility;Cardiopulmonary status limiting activity  PT Treatment Interventions Gait training;Functional mobility training;Therapeutic exercise;Patient/family education   PT Goals (Current goals can be found in the Care Plan section) Acute Rehab PT Goals Patient Stated Goal: return home PT Goal Formulation: With patient Time For Goal Achievement: 07/09/14 Potential to Achieve Goals: Fair    Frequency Min 3X/week   Barriers to discharge Decreased caregiver support      Co-evaluation               End of Session Equipment Utilized During Treatment: Gait belt Activity Tolerance: Patient limited by fatigue Patient left: in chair;with call bell/phone within reach;with chair alarm set Nurse Communication: Mobility status         Time: 0347-4259 PT Time Calculation (min) (ACUTE ONLY): 42 min   Charges:   PT Evaluation $Initial PT Evaluation Tier I: 1 Procedure     PT G CodesDemetrios Isaacs L 06/25/2014, 2:20 PM

## 2014-06-25 NOTE — Progress Notes (Signed)
[facility] Progress Note   TONI DEMO CVE:[id number] DOB: 1934/11/30 DOA: 06/24/2014 PCP: Tula Nakayama, MD  Brief narrative: [name] is a 80 y.o. female who presented to the hospital for dyspnea and was admitted for an asthma exacerbation.    Subjective: Dyspnea better, no wheezing or cough.   Assessment/Plan: Principal Problem:   Asthma exacerbation - cont steroids at current dose and cont nebs  Active Problems:   Essential hypertension - cont current meds    Anxiety and depression - Xaxan PRN    ALZHEIMERS DISEASE - stable   Code Status: full code Family Communication:  Disposition Plan: f/u on PT eval DVT prophylaxis: Heparin  Consultants: none  Procedures: none  Antibiotics: Anti-infectives    None         Objective: Filed Weights   06/24/14 1642 06/24/14 2138  Weight: 83.008 kg (183 lb) 82.4 kg (181 lb 10.5 oz)    Intake/Output Summary (Last 24 hours) at 06/25/14 1435 Last data filed at 06/25/14 1407  Gross per 24 hour  Intake    640 ml  Output      1 ml  Net    639 ml     Vitals Filed Vitals:   06/25/14 0554 06/25/14 0855 06/25/14 1400 06/25/14 1418  BP: 112/57 121/81  116/70  Pulse: 64 62  62  Temp: 97.6 F (36.4 C)   98.3 F (36.8 C)  TempSrc: Oral   Oral  Resp: 17   18  Height:      Weight:      SpO2: 95%  84% 96%    Exam: General: AAO x3, No acute respiratory distress Lungs: mild wheeze b/l  Cardiovascular: Regular rate and rhythm without murmur gallop or rub normal S1 and S2 Abdomen: Nontender, nondistended, soft, bowel sounds positive, no rebound, no ascites, no appreciable mass Extremities: No significant cyanosis, clubbing, or edema bilateral lower extremities  Data Reviewed: Basic Metabolic Panel:  Recent Labs Lab 06/24/14 1652 06/25/14 0532  NA 140 142  K 2.5* 2.9*  CL 99 97  CO2 36* 36*  GLUCOSE 99 138*  BUN 10 12  CREATININE 0.85 0.66  CALCIUM 8.1* 8.6   Liver Function  Tests:  Recent Labs Lab 06/25/14 0532  AST 23  ALT 13  ALKPHOS 84  BILITOT 0.7  PROT 6.5  ALBUMIN 2.8*   No results for input(s): LIPASE, AMYLASE in the last 168 hours. No results for input(s): AMMONIA in the last 168 hours. CBC:  Recent Labs Lab 06/24/14 1652 06/25/14 0532  WBC 8.7 4.0  NEUTROABS 5.0  --   HGB 10.9* 11.2*  HCT 35.4* 35.4*  MCV 92.7 92.2  PLT 228 201   Cardiac Enzymes:  Recent Labs Lab 06/24/14 1652  TROPONINI 0.03   BNP (last 3 results) No results for input(s): BNP in the last 8760 hours.  ProBNP (last 3 results)  Recent Labs  03/13/14 1140  PROBNP 496.6*    CBG: No results for input(s): GLUCAP in the last 168 hours.  No results found for this or any previous visit (from the past 240 hour(s)).   Studies:  Recent x-ray studies have been reviewed in detail by the Attending Physician  Scheduled Meds:  Scheduled Meds: . antiseptic oral rinse  7 mL Mouth Rinse BID  . citalopram  20 mg Oral Daily  . clotrimazole   Topical BID  . donepezil  10 mg Oral QHS  . furosemide  20 mg Oral Daily  . heparin  5,000 Units Subcutaneous 3 times per day  . Influenza vac split quadrivalent PF  0.5 mL Intramuscular Tomorrow-1000  . loperamide  2 mg Oral BID  . methylPREDNISolone (SOLU-MEDROL) injection  80 mg Intravenous Q12H  . mometasone-formoterol  2 puff Inhalation BID  . pantoprazole  40 mg Oral Daily  . potassium chloride  10 mEq Intravenous Q1 Hr x 4  . potassium chloride SA  40 mEq Oral Daily  . potassium chloride  40 mEq Oral Q4H  . propranolol  20 mg Oral BID  . QUEtiapine  12.5 mg Oral QHS  . sodium chloride  3 mL Intravenous Q12H  . sodium chloride  3 mL Intravenous Q12H  . traMADol  50 mg Oral BID   Continuous Infusions:   Time spent on care of this patient: 35 min   [place], MD 06/25/2014, 2:35 PM  LOS: 1 day   [facility] Office  [phone] Pager - Text Page per [email or web address]  If 7PM-7AM, please contact  night-coverage [email or web address]

## 2014-06-25 NOTE — Care Management Note (Signed)
    Page 1 of 2   06/27/2014     11:27:27 AM CARE MANAGEMENT NOTE 06/27/2014  Patient:  [name], [name]   Account Number:  [id number]  Date Initiated:  06/25/2014  Documentation initiated by:  Theophilus Kinds  Subjective/Objective Assessment:   Pt admitted from home with asthma exacerbation. Pt lives with her grandson and anticipates discharge home. Pt is fairly independent with ADl's. Pt has a walker and neb machine for home use.     Action/Plan:   Will need O2 assessment prior to discharge. May need [facility] RN and PT.   Anticipated DC Date:  06/27/2014   Anticipated DC Plan:  [place] [facility]  CM consult      [facility] Choice  HOME HEALTH  DURABLE MEDICAL EQUIPMENT   Choice offered to / List presented to:  C-4 Adult Children   DME arranged  [facility]      DME agency  [place] arranged  HH-1 RN  HH-10 DISEASE MANAGEMENT  HH-2 PT  HH-4 NURSE'S AIDE      [place].   Status of service:  Completed, signed off Medicare Important Message given?  YES (If response is "NO", the following Medicare IM given date fields will be blank) Date Medicare IM given:  06/27/2014 Medicare IM given by:  Theophilus Kinds Date Additional Medicare IM given:   Additional Medicare IM given by:    Discharge Disposition:  [place]  Per UR Regulation:    If discussed at Long Length of Stay Meetings, dates discussed:    Comments:  06/27/14 [place], RN BSN CM Pt discharged home today with [facility] RN, PT, and aide (per pts grandson choice). Romualdo Bolk of [facility] is aware and will collect the pts information from the chart. [place] services to start within 48 hours of discharge. Pt qualifies for home O2 and pts grandson wants that from [facility] as well. Romualdo Bolk of [facility] is aware and willl deliver portable to pts room prior to discharge and the rest of the O2 equipment will be delivered to pts home after  discharge. Pt and pts nurse aware of discharge arrangements.  06/25/14 [place], RN BSN CM

## 2014-06-26 LAB — BASIC METABOLIC PANEL
ANION GAP: 3 — AB (ref 5–15)
BUN: 15 mg/dL (ref 6–23)
CALCIUM: 8.5 mg/dL (ref 8.4–10.5)
CO2: 35 mmol/L — AB (ref 19–32)
Chloride: 100 mmol/L (ref 96–112)
Creatinine, Ser: 0.67 mg/dL (ref 0.50–1.10)
GFR calc Af Amer: >90 mL/min (ref >90)
GFR calc non Af Amer: 83 mL/min — ABNORMAL LOW (ref >90)
Glucose, Bld: 135 mg/dL — ABNORMAL HIGH (ref 70–99)
POTASSIUM: 4.4 mmol/L (ref 3.5–5.1)
SODIUM: 138 mmol/L (ref 135–145)

## 2014-06-26 MED ORDER — METHYLPREDNISOLONE SODIUM SUCC 125 MG IJ SOLR
60.0000 mg | INTRAMUSCULAR | Status: DC
  Administered 2014-06-27: 60 mg via INTRAVENOUS
  Filled 2014-06-26: qty 2

## 2014-06-26 MED ORDER — IPRATROPIUM-ALBUTEROL 0.5-2.5 (3) MG/3ML IN SOLN
3.0000 mL | Freq: Three times a day (TID) | RESPIRATORY_TRACT | Status: DC
  Administered 2014-06-27: 3 mL via RESPIRATORY_TRACT
  Filled 2014-06-26: qty 3

## 2014-06-26 MED ORDER — IPRATROPIUM-ALBUTEROL 0.5-2.5 (3) MG/3ML IN SOLN
3.0000 mL | RESPIRATORY_TRACT | Status: DC
  Administered 2014-06-26 (×2): 3 mL via RESPIRATORY_TRACT
  Filled 2014-06-26 (×3): qty 3

## 2014-06-26 NOTE — Progress Notes (Signed)
   06/26/14 1300  Mobility  Activity Ambulate in hall  Level of Assistance Modified independent, requires aide device or extra time  Assistive Device Front wheel walker  Distance Ambulated (ft) 20 ft  Ambulation Response Tolerated fair  Patient's O2 saturation is 88% on room air at rest. Patient's O2 saturation is 90% on 3L with exertion.

## 2014-06-26 NOTE — Progress Notes (Signed)
[facility] Progress Note   [name] NOB:[id number] DOB: 11/30/1934 DOA: 06/24/2014 PCP: Tula Nakayama, MD  Brief narrative: [name] is a 80 y.o. female who presented to the hospital for dyspnea and was admitted for an asthma exacerbation.    Subjective: Dyspnea better, cough better, but still wheezing.   Assessment/Plan: Principal Problem:   Asthma exacerbation Taper steroids in am.  Started her on duonebs, continue dulera.   Active Problems:   Essential hypertension - cont current meds    Anxiety and depression - Xaxan PRN    ALZHEIMERS DISEASE - stable   Code Status: full code Family Communication:  Disposition Plan: f/u on PT eval DVT prophylaxis: Heparin  Consultants: none  Procedures: none  Antibiotics: Anti-infectives    None         Objective: Filed Weights   06/24/14 1642 06/24/14 2138  Weight: 83.008 kg (183 lb) 82.4 kg (181 lb 10.5 oz)    Intake/Output Summary (Last 24 hours) at 06/26/14 1724 Last data filed at 06/26/14 1539  Gross per 24 hour  Intake    240 ml  Output    300 ml  Net    -60 ml     Vitals Filed Vitals:   06/26/14 1312 06/26/14 1313 06/26/14 1314 06/26/14 1317  BP:    113/46  Pulse:    65  Temp:    98.5 F (36.9 C)  TempSrc:    Oral  Resp:    18  Height:      Weight:      SpO2: 96% 88% 90%     Exam: General: AAO x3, No acute respiratory distress Lungs: scattered wheezing heard.  Cardiovascular: Regular rate and rhythm without murmur gallop or rub normal S1 and S2 Abdomen: Nontender, nondistended, soft, bowel sounds positive, no rebound, no ascites, no appreciable mass Extremities: No significant cyanosis, clubbing, or edema bilateral lower extremities  Data Reviewed: Basic Metabolic Panel:  Recent Labs Lab 06/24/14 1652 06/25/14 0532 06/26/14 0625  NA 140 142 138  K 2.5* 2.9* 4.4  CL 99 97 100  CO2 36* 36* 35*  GLUCOSE 99 138* 135*  BUN 10 12 15   CREATININE 0.85 0.66  0.67  CALCIUM 8.1* 8.6 8.5   Liver Function Tests:  Recent Labs Lab 06/25/14 0532  AST 23  ALT 13  ALKPHOS 84  BILITOT 0.7  PROT 6.5  ALBUMIN 2.8*   No results for input(s): LIPASE, AMYLASE in the last 168 hours. No results for input(s): AMMONIA in the last 168 hours. CBC:  Recent Labs Lab 06/24/14 1652 06/25/14 0532  WBC 8.7 4.0  NEUTROABS 5.0  --   HGB 10.9* 11.2*  HCT 35.4* 35.4*  MCV 92.7 92.2  PLT 228 201   Cardiac Enzymes:  Recent Labs Lab 06/24/14 1652  TROPONINI 0.03   BNP (last 3 results) No results for input(s): BNP in the last 8760 hours.  ProBNP (last 3 results)  Recent Labs  03/13/14 1140  PROBNP 496.6*    CBG: No results for input(s): GLUCAP in the last 168 hours.  No results found for this or any previous visit (from the past 240 hour(s)).   Studies:  Recent x-ray studies have been reviewed in detail by the Attending Physician  Scheduled Meds:  Scheduled Meds: . antiseptic oral rinse  7 mL Mouth Rinse BID  . citalopram  20 mg Oral Daily  . clotrimazole   Topical BID  . donepezil  10 mg Oral QHS  .  furosemide  20 mg Oral Daily  . heparin  5,000 Units Subcutaneous 3 times per day  . Influenza vac split quadrivalent PF  0.5 mL Intramuscular Tomorrow-1000  . ipratropium-albuterol  3 mL Nebulization Q4H  . loperamide  2 mg Oral BID  . [START ON 06/27/2014] methylPREDNISolone (SOLU-MEDROL) injection  60 mg Intravenous Q24H  . mometasone-formoterol  2 puff Inhalation BID  . pantoprazole  40 mg Oral Daily  . potassium chloride SA  40 mEq Oral Daily  . propranolol  20 mg Oral BID  . QUEtiapine  12.5 mg Oral QHS  . sodium chloride  3 mL Intravenous Q12H  . sodium chloride  3 mL Intravenous Q12H  . traMADol  50 mg Oral BID   Continuous Infusions:   Time spent on care of this patient: 15 min   Airelle Everding, MD 06/26/2014, 5:24 PM  LOS: 2 days   [facility] Office  [phone] Pager - Text Page per [email or web address]  If  7PM-7AM, please contact night-coverage [email or web address]

## 2014-06-26 NOTE — Progress Notes (Signed)
Physical Therapy Treatment Patient Details Name: [name] MRN: [id number] DOB: 12-18-1934 Today's Date: 06/26/2014    History of Present Illness This is a 80 year old lady who presents with onset of dyspnea. She says that she became anxious and became more short of breath after she realized that her legs had become weak and she was not able to get out of her own bed. She denied any specific limb weakness or facial weakness.     PT Comments    Pt agreeable to treatment today, though did complain of (B) knee pain and given pain medication by RN prior to treatment.  Noted shaking/tremors in upper body/arms during seated therapeutic exercises.  Pt reports she is seeing MD [place] for neurology consult regarding tremors, and PD has been ruled out.  Pt able to tolerate seated exercises without difficulty today, and demonstrated improved gait distance to 50 feet.  VC during gait for technique as pt tends to keep RW away from body; VC for placement inside walker edge.  Re-assessed bed mobility with pt requiring less assist with sit -> supine, though some assist still required.  Pt reports at home she crawls into bed on stomach, then rolls over (does NOT typically transfer sit -> supine and that is why she is requiring assist with bed mobility).  Pt reports her family and church friends will be able to stay with her at d/c "for a couple of days" and would like HHPT and not SNF placement.  Reviewed OT evaluation, and OT agreeable to [facility] services.  Pt agreeable to HHPT services at this time.  Will continue to follow in the hospital to address strengthening, activity tolerance, and balance for improving functional mobility skills.   Follow Up Recommendations  Home health PT;Supervision/Assistance - 24 hour (Pt reports her family and church friends will be able to stay with her at d/c "for a couple of days".  Pt refusing SNF placement. )     Equipment Recommendations  None recommended by PT        Precautions / Restrictions Precautions Precautions: Fall Restrictions Weight Bearing Restrictions: No    Mobility  Bed Mobility Overal bed mobility: Needs Assistance Bed Mobility: Supine to Sit;Sit to Supine     Supine to sit: Modified independent (Device/Increase time) Sit to supine: Min assist (for LE)   General bed mobility comments: Pt reports she crawls into bed on her stomach, then rolls over in bed instead of transferring sit -> supine  Transfers Overall transfer level: Needs assistance Equipment used: Rolling walker (2 wheeled) Transfers: Sit to/from [employer] Sit to Stand: Min guard Stand pivot transfers: Min guard          Ambulation/Gait Ambulation/Gait assistance: Min guard Ambulation Distance (Feet): 50 Feet Assistive device: Rolling walker (2 wheeled) Gait Pattern/deviations: Step-through pattern;Antalgic;Decreased stance time - left     General Gait Details: Crouched gait patterning secondary to hx of surgery on Lt knee, with flexion contracture present.  VC during gait for placement in walker as pt has a tendency to push walker too far in front of her. O2 after gait at 86% on RA.           Cognition Arousal/Alertness: Awake/alert Behavior During Therapy: WFL for tasks assessed/performed Overall Cognitive Status: Within Functional Limits for tasks assessed                      Exercises General Exercises - Lower Extremity Ankle Circles/Pumps: AROM;Both;Seated (Long sit) Long Arc  Quad: AROM;Both;10 reps;Seated Heel Slides: AROM;Both;10 reps;Seated (Long sit) Hip ABduction/ADduction: AROM;Both;10 reps;Seated (Long sit) Hip Flexion/Marching: AROM;Both;10 reps;Seated        Pertinent Vitals/Pain Pain Assessment: No/denies pain    Home Living Family/patient expects to be discharged to:: Private residence Living Arrangements: Children Available Help at Discharge: Family;Friend(s) Type of Home: House Home Access:  Level entry   Home Layout: One level Home Equipment: Environmental consultant - 2 wheels;Walker - 4 wheels;Shower seat;Adaptive equipment Additional Comments: lives with grandson who works daytime hours...pt states that pt's family plans to get a CG for her while grandson is at work    Prior Function Level of Independence: Independent with assistive device(s)          PT Goals (current goals can now be found in the care plan section) Acute Rehab PT Goals Patient Stated Goal: return home PT Goal Formulation: With patient Time For Goal Achievement: 07/09/14 Potential to Achieve Goals: Fair Progress towards PT goals: Progressing toward goals    Frequency  Min 3X/week    PT Plan Current plan remains appropriate       End of Session Equipment Utilized During Treatment: Gait belt Activity Tolerance: Patient limited by fatigue Patient left: in chair;with call bell/phone within reach;with chair alarm set     Time: 2409-7353 PT Time Calculation (min) (ACUTE ONLY): 33 min  Charges:  $Gait Training: 8-22 mins $Therapeutic Exercise: 8-22 mins                     Lonna Cobb, DPT [phone]  06/26/2014, 10:47 AM

## 2014-06-26 NOTE — Clinical Social Work Psychosocial (Signed)
Clinical Social Work Department BRIEF PSYCHOSOCIAL ASSESSMENT 06/26/2014  Patient:  [name], [name]     Account Number:  [id number]     Admit date:  06/24/2014  Clinical Social Worker:  Wyatt Haste  Date/Time:  06/26/2014 10:03 AM  Referred by:  CSW  Date Referred:  06/26/2014 Referred for  SNF Placement   Other Referral:   Interview type:  Patient Other interview type:    PSYCHOSOCIAL DATA Living Status:  FAMILY Admitted from facility:   Level of care:   Primary support name:  Dorothea Ogle Primary support relationship to patient:  FAMILY Degree of support available:   supportive per pt    CURRENT CONCERNS Current Concerns  Post-Acute Placement   Other Concerns:    SOCIAL WORK ASSESSMENT / PLAN CSW met with pt at bedside. Pt alert and oriented and reports she lives with her grandson, Dorothea Ogle. Pt indicates she was very weak and pressed her [facility] for EMS at home. Admitted due to asthma exacerbation. Pt states she is alone during the day while Dorothea Ogle is at work, but generally manages fine. Her church friends sometimes come by to visit during the day. Pt has a son who lives out of town and a daughter who is limited due to medical issues. She also has two other children whom she has no contact with. CSW discussed PT evaluation yesterday with pt. She is refusing SNF and states that her family is going to work on a plan to have someone stay with her for awhile when Dorothea Ogle works. She is agreeable to home health services. CSW spoke with Dorothea Ogle to confirm plan. Pt's husband died several years ago and he said he made a promise to him that he would never send her to a facility, even short term. He also requests return home with home health. CM aware.   Assessment/plan status:  No Further Intervention Required Other assessment/ plan:   Information/referral to community resources:   CM for home health    PATIENT'S/FAMILY'S RESPONSE TO PLAN OF CARE: Pt and family plan for pt to return home. CSW  will sign off, but can be reconsulted if needed.       Benay Pike, [place]

## 2014-06-26 NOTE — Evaluation (Signed)
Occupational Therapy Evaluation Patient Details Name: [name] MRN: [id number] DOB: [birth year] Today's Date: 06/26/2014    History of Present Illness This is a 80 year old lady who presents with onset of dyspnea. She says that she became anxious and became more short of breath after she realized that her legs had become weak and she was not able to get out of her own bed. She denied any specific limb weakness or facial weakness.    Clinical Impression   Patient lives with grandson who works AM hours. Patient states that he will be working in [place] for a short period of time and his girlfriend will be able to stay with her. Pt's has some UB weakness and needs min assist for BADL. I believe patient will be safe to return home with [facility] OT as long as she has a family member or friend that can stay with her for a little while.    Follow Up Recommendations  Home health OT    Equipment Recommendations  None recommended by OT       Precautions / Restrictions Precautions Precautions: Fall Restrictions Weight Bearing Restrictions: No      Mobility Bed Mobility   Bed Mobility: Supine to Sit     Supine to sit: Modified independent (Device/Increase time)        Transfers   Equipment used: Rolling walker (2 wheeled) Transfers: Sit to/from [employer] to Stand: Min guard Stand pivot transfers: Min guard                 ADL Overall ADL's : Needs assistance/impaired                     Lower Body Dressing: Minimal assistance;Sit to/from stand   Toilet Transfer: Min guard;Ambulation;RW           Functional mobility during ADLs: Min guard;Rolling walker                 Pertinent Vitals/Pain Pain Assessment: No/denies pain     Hand Dominance Right   Extremity/Trunk Assessment Upper Extremity Assessment Upper Extremity Assessment: Generalized weakness   Lower Extremity Assessment Lower Extremity Assessment: Defer to PT  evaluation       Communication Communication Communication: No difficulties   Cognition Arousal/Alertness: Awake/alert Behavior During Therapy: WFL for tasks assessed/performed Overall Cognitive Status: Within Functional Limits for tasks assessed                                Home Living Family/patient expects to be discharged to:: Private residence Living Arrangements: Children Available Help at Discharge: Family;Friend(s) Type of Home: House Home Access: Level entry     Home Layout: One level     Bathroom Shower/Tub: Walk-in shower;Tub/shower unit         Home Equipment: Walker - 2 wheels;Walker - 4 wheels;Shower seat;Adaptive equipment Adaptive Equipment: Sock aid Additional Comments: lives with grandson who works daytime hours...pt states that pt's family plans to get a CG for her while grandson is at work      Prior Functioning/Environment Level of Independence: Independent with assistive device(s)              End of Session Equipment Utilized During Treatment: Gait belt;Rolling walker  Activity Tolerance: Patient tolerated treatment well Patient left: in chair;with call bell/phone within reach   Time: 0815-0840 OT Time Calculation (min): 25 min Charges:  OT General Charges $  OT Visit: 1 Procedure OT Evaluation $Initial OT Evaluation Tier I: 1 Procedure G-Codes:    Ailene Ravel, OTR/L,CBIS  [phone]  06/26/2014, 8:45 AM

## 2014-06-27 LAB — BASIC METABOLIC PANEL
Anion gap: 5 (ref 5–15)
BUN: 25 mg/dL — ABNORMAL HIGH (ref 6–23)
CO2: 37 mmol/L — ABNORMAL HIGH (ref 19–32)
Calcium: 8.5 mg/dL (ref 8.4–10.5)
Chloride: 100 mmol/L (ref 96–112)
Creatinine, Ser: 0.76 mg/dL (ref 0.50–1.10)
GFR, EST NON AFRICAN AMERICAN: 79 mL/min — AB (ref >90)
GLUCOSE: 89 mg/dL (ref 70–99)
POTASSIUM: 3.8 mmol/L (ref 3.5–5.1)
Sodium: 142 mmol/L (ref 135–145)

## 2014-06-27 MED ORDER — IPRATROPIUM-ALBUTEROL 0.5-2.5 (3) MG/3ML IN SOLN: 3.0000 mL | Freq: Four times a day (QID) | RESPIRATORY_TRACT | Status: AC | PRN

## 2014-06-27 MED ORDER — TRAMADOL HCL 50 MG PO TABS: 50.0000 mg | ORAL_TABLET | Freq: Two times a day (BID) | ORAL | Status: DC

## 2014-06-27 MED ORDER — PREDNISONE (PAK) 10 MG PO TABS: ORAL_TABLET | Freq: Every day | ORAL | Status: DC

## 2014-06-27 NOTE — Discharge Instructions (Signed)

## 2014-06-27 NOTE — Discharge Summary (Signed)
Physician Discharge Summary  [name] SJG:[id number] DOB: 27-Feb-1935 DOA: 06/24/2014  PCP: Tula Nakayama, MD  Admit date: 06/24/2014 Discharge date: 06/27/2014  Time spent: 25 minutes  Recommendations for Outpatient Follow-up:  1. Follow up in one week with PCP.   Discharge Diagnoses:  Principal Problem:   Asthma exacerbation Active Problems:   Essential hypertension   Anxiety and depression   ALZHEIMERS DISEASE   Discharge Condition: IMPROVED.   Diet recommendation: low sodium diet.   Filed Weights   06/24/14 1642 06/24/14 2138  Weight: 83.008 kg (183 lb) 82.4 kg (181 lb 10.5 oz)    History of present illness:  [name] is a 80 y.o. female who presented to the hospital for dyspnea and was admitted for an asthma exacerbation.   Hospital Course:  Asthma exacerbation Improved. On tapering steroids on discharge.  Started her on duonebs, continue dulera.   Active Problems:  Essential hypertension - cont current meds   Anxiety and depression - Xaxan PRN   ALZHEIMERS DISEASE - stable  Procedures:  none  Consultations:  none  Discharge Exam: Filed Vitals:   06/27/14 0712  BP: 120/44  Pulse: 53  Temp: 98.6 F (37 C)  Resp: 18    General: alert afebrile comfortable Cardiovascular: s1s2 Respiratory: ctab, no wheezing.   Discharge Instructions   Discharge Instructions    Diet - low sodium heart healthy    Complete by:  As directed      Discharge instructions    Complete by:  As directed   Follow up with PCP in one week.          Discharge Medication List as of 06/27/2014  1:11 PM    START taking these medications   Details  ipratropium-albuterol (DUONEB) 0.5-2.5 (3) MG/3ML SOLN Take 3 mLs by nebulization every 6 (six) hours as needed., Starting 06/27/2014, Until Discontinued, Print    predniSONE (STERAPRED UNI-PAK) 10 MG tablet Take by mouth daily. Prednisone 40 mg daily for 5 days., Starting 06/27/2014, Until Discontinued, Print       CONTINUE these medications which have CHANGED   Details  traMADol (ULTRAM) 50 MG tablet Take 1 tablet (50 mg total) by mouth 2 (two) times daily., Starting 06/27/2014, Until Discontinued, Print      CONTINUE these medications which have NOT CHANGED   Details  ADVAIR DISKUS 100-50 MCG/DOSE AEPB INHALE 1 PUFF INTO THE LUNGS 2 (TWO) TIMES DAILY., Normal    albuterol (PROVENTIL) (2.5 MG/3ML) 0.083% nebulizer solution Take 2.5 mg by nebulization every 6 (six) hours as needed for wheezing or shortness of breath., Until Discontinued, Historical Med    ALPRAZolam (XANAX) 0.5 MG tablet TAKE 1 TABLET BY MOUTH EVERY MORNING AND TAKE 2 TABLETS AT BEDTIME, Print    citalopram (CELEXA) 20 MG tablet TAKE 1 TABLET BY MOUTH EVERY DAY, Normal    clotrimazole-betamethasone (LOTRISONE) cream Apply 1 application topically 2 (two) times daily., Starting 04/11/2013, Until Discontinued, Normal    Diphenhyd-Hydrocort-Nystatin (FIRST-DUKES MOUTHWASH) SUSP Swish and Swallow 41ml TID x 10 days, Normal    diphenoxylate-atropine (LOMOTIL) 2.5-0.025 MG per tablet One tablet once daily for chronic diarrhea, Print    donepezil (ARICEPT) 10 MG tablet TAKE 1 TABLET BY MOUTH AT BEDTIME, Normal    furosemide (LASIX) 20 MG tablet TAKE 1 TABLET BY MOUTH EVERY DAY AND TAKE 2 TABLETS AS NEEDED FOR SWELLING, Normal    omeprazole (PRILOSEC) 40 MG capsule TAKE 1 CAPSULE BY MOUTH DAILY, Normal    potassium chloride SA (  KLOR-CON M20) 20 MEQ tablet Take 1 tablet (20 mEq total) by mouth daily. Take once a day as needed you need to take the potassium pill every day you take the furosemide, Starting 03/14/2014, Until Discontinued, Normal    propranolol (INDERAL) 20 MG tablet Take 20 mg by mouth 2 (two) times daily., Until Discontinued, Historical Med    QUEtiapine (SEROQUEL) 25 MG tablet TAKE 1/2 TABLET BY MOUTH AT BEDTIME, Normal    Vitamin D, Ergocalciferol, (DRISDOL) 50000 UNITS CAPS capsule Take 1 capsule (50,000 Units  total) by mouth every 7 (seven) days. Takes on Wednesdays., Starting 03/13/2014, Until Discontinued, Normal      STOP taking these medications     amLODipine (NORVASC) 5 MG tablet      loperamide (IMODIUM A-D) 2 MG tablet      fluticasone (FLONASE) 50 MCG/ACT nasal spray      gabapentin (NEURONTIN) 300 MG capsule        Allergies  Allergen Reactions  . Ciprofloxacin Nausea And Vomiting  . Codeine   . Diltiazem   . Doxycycline Other (See Comments)    Vomit   . Eggs Or Egg-Derived Products   . Levofloxacin   . Morphine     REACTION: pt states that she decreases in mental capacity when taking morphine  . Penicillins   . Prednisone Rash    Ulcers in mouth    Follow-up Information    Follow up with [facility].   Contact information:   [address] [place] [address] [phone]        The results of significant diagnostics from this hospitalization (including imaging, microbiology, ancillary and laboratory) are listed below for reference.    Significant Diagnostic Studies: Ct Head Wo Contrast  06/24/2014   CLINICAL DATA:  Fever and weakness for several days  EXAM: CT HEAD WITHOUT CONTRAST  TECHNIQUE: Contiguous axial images were obtained from the base of the skull through the vertex without intravenous contrast.  COMPARISON:  None.  FINDINGS: Prominence of the sulci and ventricles identified consistent with brain atrophy. Mild low attenuation within the subcortical and periventricular white matter are identified consistent with brain atrophy. There is no abnormal extra-axial fluid collection, intracranial hemorrhage or mass noted. No acute cortical infarct identified. The paranasal sinuses are clear. The mastoid air cells are also clear. The calvarium is intact.  IMPRESSION: 1. No acute intracranial abnormalities. 2. Small vessel ischemic disease and brain atrophy noted.   Electronically Signed   By: Kerby Moors M.D.   On: 06/24/2014 18:40    Dg Chest Portable 1 View  06/24/2014   CLINICAL DATA:  Short of breath and fever for 3 to 4 days  EXAM: PORTABLE CHEST - 1 VIEW  COMPARISON:  03/13/2014  FINDINGS: Normal cardiac silhouette with central venous congestion. No effusion, infiltrate, or pneumothorax. Mild left basilar atelectasis.  IMPRESSION: Mild basilar atelectasis.  Mild central venous pulmonary congestion.   Electronically Signed   By: Suzy Bouchard M.D.   On: 06/24/2014 17:25    Microbiology: No results found for this or any previous visit (from the past 240 hour(s)).   Labs: Basic Metabolic Panel:  Recent Labs Lab 06/24/14 1652 06/25/14 0532 06/26/14 0625 06/27/14 0542  NA 140 142 138 142  K 2.5* 2.9* 4.4 3.8  CL 99 97 100 100  CO2 36* 36* 35* 37*  GLUCOSE 99 138* 135* 89  BUN 10 12 15  25*  CREATININE 0.85 0.66 0.67 0.76  CALCIUM 8.1*  8.6 8.5 8.5   Liver Function Tests:  Recent Labs Lab 06/25/14 0532  AST 23  ALT 13  ALKPHOS 84  BILITOT 0.7  PROT 6.5  ALBUMIN 2.8*   No results for input(s): LIPASE, AMYLASE in the last 168 hours. No results for input(s): AMMONIA in the last 168 hours. CBC:  Recent Labs Lab 06/24/14 1652 06/25/14 0532  WBC 8.7 4.0  NEUTROABS 5.0  --   HGB 10.9* 11.2*  HCT 35.4* 35.4*  MCV 92.7 92.2  PLT 228 201   Cardiac Enzymes:  Recent Labs Lab 06/24/14 1652  TROPONINI 0.03   BNP: BNP (last 3 results) No results for input(s): BNP in the last 8760 hours.  ProBNP (last 3 results)  Recent Labs  03/13/14 1140  PROBNP 496.6*    CBG: No results for input(s): GLUCAP in the last 168 hours.     SignedHosie Poisson  [facility] 06/27/2014, 8:36 PM

## 2014-06-27 NOTE — Plan of Care (Signed)
Problem: Phase I Progression Outcomes Goal: O2 sats > or equal 90% or at baseline Outcome: Adequate for Discharge Patient qualifies for Home O2.

## 2014-06-28 DIAGNOSIS — F418 Other specified anxiety disorders: Secondary | ICD-10-CM | POA: Diagnosis not present

## 2014-06-28 DIAGNOSIS — J441 Chronic obstructive pulmonary disease with (acute) exacerbation: Secondary | ICD-10-CM | POA: Diagnosis not present

## 2014-06-28 DIAGNOSIS — J45901 Unspecified asthma with (acute) exacerbation: Secondary | ICD-10-CM | POA: Diagnosis not present

## 2014-06-28 DIAGNOSIS — I10 Essential (primary) hypertension: Secondary | ICD-10-CM | POA: Diagnosis not present

## 2014-06-28 DIAGNOSIS — K219 Gastro-esophageal reflux disease without esophagitis: Secondary | ICD-10-CM | POA: Diagnosis not present

## 2014-06-28 DIAGNOSIS — G309 Alzheimer's disease, unspecified: Secondary | ICD-10-CM | POA: Diagnosis not present

## 2014-07-01 DIAGNOSIS — J45901 Unspecified asthma with (acute) exacerbation: Secondary | ICD-10-CM | POA: Diagnosis not present

## 2014-07-01 DIAGNOSIS — J441 Chronic obstructive pulmonary disease with (acute) exacerbation: Secondary | ICD-10-CM | POA: Diagnosis not present

## 2014-07-01 DIAGNOSIS — I10 Essential (primary) hypertension: Secondary | ICD-10-CM | POA: Diagnosis not present

## 2014-07-01 DIAGNOSIS — F418 Other specified anxiety disorders: Secondary | ICD-10-CM | POA: Diagnosis not present

## 2014-07-01 DIAGNOSIS — G309 Alzheimer's disease, unspecified: Secondary | ICD-10-CM | POA: Diagnosis not present

## 2014-07-01 DIAGNOSIS — K219 Gastro-esophageal reflux disease without esophagitis: Secondary | ICD-10-CM | POA: Diagnosis not present

## 2014-07-03 DIAGNOSIS — F418 Other specified anxiety disorders: Secondary | ICD-10-CM | POA: Diagnosis not present

## 2014-07-03 DIAGNOSIS — J441 Chronic obstructive pulmonary disease with (acute) exacerbation: Secondary | ICD-10-CM | POA: Diagnosis not present

## 2014-07-03 DIAGNOSIS — J45901 Unspecified asthma with (acute) exacerbation: Secondary | ICD-10-CM | POA: Diagnosis not present

## 2014-07-03 DIAGNOSIS — G309 Alzheimer's disease, unspecified: Secondary | ICD-10-CM | POA: Diagnosis not present

## 2014-07-03 DIAGNOSIS — I10 Essential (primary) hypertension: Secondary | ICD-10-CM | POA: Diagnosis not present

## 2014-07-03 DIAGNOSIS — K219 Gastro-esophageal reflux disease without esophagitis: Secondary | ICD-10-CM | POA: Diagnosis not present

## 2014-07-06 DIAGNOSIS — J441 Chronic obstructive pulmonary disease with (acute) exacerbation: Secondary | ICD-10-CM | POA: Diagnosis not present

## 2014-07-06 DIAGNOSIS — K219 Gastro-esophageal reflux disease without esophagitis: Secondary | ICD-10-CM | POA: Diagnosis not present

## 2014-07-06 DIAGNOSIS — I10 Essential (primary) hypertension: Secondary | ICD-10-CM | POA: Diagnosis not present

## 2014-07-06 DIAGNOSIS — F418 Other specified anxiety disorders: Secondary | ICD-10-CM | POA: Diagnosis not present

## 2014-07-06 DIAGNOSIS — G309 Alzheimer's disease, unspecified: Secondary | ICD-10-CM | POA: Diagnosis not present

## 2014-07-06 DIAGNOSIS — J45901 Unspecified asthma with (acute) exacerbation: Secondary | ICD-10-CM | POA: Diagnosis not present

## 2014-07-09 ENCOUNTER — Ambulatory Visit: Payer: 59 | Admitting: Family Medicine

## 2014-07-09 DIAGNOSIS — G309 Alzheimer's disease, unspecified: Secondary | ICD-10-CM | POA: Diagnosis not present

## 2014-07-09 DIAGNOSIS — J45901 Unspecified asthma with (acute) exacerbation: Secondary | ICD-10-CM | POA: Diagnosis not present

## 2014-07-09 DIAGNOSIS — J441 Chronic obstructive pulmonary disease with (acute) exacerbation: Secondary | ICD-10-CM | POA: Diagnosis not present

## 2014-07-09 DIAGNOSIS — K219 Gastro-esophageal reflux disease without esophagitis: Secondary | ICD-10-CM | POA: Diagnosis not present

## 2014-07-09 DIAGNOSIS — I10 Essential (primary) hypertension: Secondary | ICD-10-CM | POA: Diagnosis not present

## 2014-07-09 DIAGNOSIS — F418 Other specified anxiety disorders: Secondary | ICD-10-CM | POA: Diagnosis not present

## 2014-07-10 ENCOUNTER — Encounter: Payer: Self-pay | Admitting: *Deleted

## 2014-07-10 ENCOUNTER — Telehealth: Payer: Self-pay | Admitting: *Deleted

## 2014-07-10 DIAGNOSIS — I10 Essential (primary) hypertension: Secondary | ICD-10-CM | POA: Diagnosis not present

## 2014-07-10 DIAGNOSIS — K219 Gastro-esophageal reflux disease without esophagitis: Secondary | ICD-10-CM | POA: Diagnosis not present

## 2014-07-10 DIAGNOSIS — G309 Alzheimer's disease, unspecified: Secondary | ICD-10-CM | POA: Diagnosis not present

## 2014-07-10 DIAGNOSIS — J441 Chronic obstructive pulmonary disease with (acute) exacerbation: Secondary | ICD-10-CM | POA: Diagnosis not present

## 2014-07-10 DIAGNOSIS — F418 Other specified anxiety disorders: Secondary | ICD-10-CM | POA: Diagnosis not present

## 2014-07-10 DIAGNOSIS — J45901 Unspecified asthma with (acute) exacerbation: Secondary | ICD-10-CM | POA: Diagnosis not present

## 2014-07-10 NOTE — Telephone Encounter (Signed)
Pre-Visit Call:   Reviewed allergies, medications, health history, and health maintenance with patient and made changes as appropriate.   Preferred pharmacy:  [id number] - [place], [place] - [address]  CCS- 08/03/01- diverticulosis  [facility]mg- 04/23/14- BI-RADS CAT 2: Neg BD- never had, interested in having  Flu- 02/27/14  Td- 01/26/11 Pnm- 02/17/04 (23?)  Zoster-  Never had, would like  Concerns:  -Was hospitalized recently for hypokalemia and hypotension.  She states she has decreased her Lasix to 20mg  daily, usually takes Potassium supplements, and they have taken most of her BP medications off the list.   -She was catheterized in the hospital and now has episodes of urgency/incontinence.   -She is having intermittent episodes of diarrhea.

## 2014-07-11 ENCOUNTER — Encounter: Payer: Self-pay | Admitting: Family Medicine

## 2014-07-11 ENCOUNTER — Ambulatory Visit (INDEPENDENT_AMBULATORY_CARE_PROVIDER_SITE_OTHER): Payer: 59 | Admitting: Family Medicine

## 2014-07-11 VITALS — BP 120/80 | HR 80 | Temp 97.6°F | Ht 63.0 in | Wt 174.2 lb

## 2014-07-11 DIAGNOSIS — F418 Other specified anxiety disorders: Secondary | ICD-10-CM | POA: Diagnosis not present

## 2014-07-11 DIAGNOSIS — I10 Essential (primary) hypertension: Secondary | ICD-10-CM | POA: Diagnosis not present

## 2014-07-11 DIAGNOSIS — R350 Frequency of micturition: Secondary | ICD-10-CM | POA: Diagnosis not present

## 2014-07-11 DIAGNOSIS — Y92009 Unspecified place in unspecified non-institutional (private) residence as the place of occurrence of the external cause: Secondary | ICD-10-CM

## 2014-07-11 DIAGNOSIS — K219 Gastro-esophageal reflux disease without esophagitis: Secondary | ICD-10-CM

## 2014-07-11 DIAGNOSIS — Y92099 Unspecified place in other non-institutional residence as the place of occurrence of the external cause: Secondary | ICD-10-CM

## 2014-07-11 DIAGNOSIS — W19XXXA Unspecified fall, initial encounter: Secondary | ICD-10-CM

## 2014-07-11 DIAGNOSIS — F0280 Dementia in other diseases classified elsewhere without behavioral disturbance: Secondary | ICD-10-CM

## 2014-07-11 DIAGNOSIS — F419 Anxiety disorder, unspecified: Secondary | ICD-10-CM

## 2014-07-11 DIAGNOSIS — Z79899 Other long term (current) drug therapy: Secondary | ICD-10-CM | POA: Diagnosis not present

## 2014-07-11 DIAGNOSIS — D649 Anemia, unspecified: Secondary | ICD-10-CM

## 2014-07-11 DIAGNOSIS — F329 Major depressive disorder, single episode, unspecified: Secondary | ICD-10-CM

## 2014-07-11 DIAGNOSIS — Z79891 Long term (current) use of opiate analgesic: Secondary | ICD-10-CM | POA: Diagnosis not present

## 2014-07-11 DIAGNOSIS — G309 Alzheimer's disease, unspecified: Secondary | ICD-10-CM

## 2014-07-11 MED ORDER — FUROSEMIDE 20 MG PO TABS: ORAL_TABLET | ORAL | Status: AC

## 2014-07-11 MED ORDER — VITAMIN D (ERGOCALCIFEROL) 1.25 MG (50000 UNIT) PO CAPS: 50000.0000 [IU] | ORAL_CAPSULE | ORAL | Status: DC

## 2014-07-11 NOTE — Progress Notes (Signed)
Pre visit review using our clinic review tool, if applicable. No additional management support is needed unless otherwise documented below in the visit note. 

## 2014-07-11 NOTE — Patient Instructions (Signed)
Rel of rec Dr Elinor Parkinson (sp?) [facility] Preventive Care for Adults A healthy lifestyle and preventive care can promote health and wellness. Preventive health guidelines for women include the following key practices.  A routine yearly physical is a good way to check with your health care provider about your health and preventive screening. It is a chance to share any concerns and updates on your health and to receive a thorough exam.  Visit your dentist for a routine exam and preventive care every 6 months. Brush your teeth twice a day and floss once a day. Good oral hygiene prevents tooth decay and gum disease.  The frequency of eye exams is based on your age, health, family medical history, use of contact lenses, and other factors. Follow your health care provider's recommendations for frequency of eye exams.  Eat a healthy diet. Foods like vegetables, fruits, whole grains, low-fat dairy products, and lean protein foods contain the nutrients you need without too many calories. Decrease your intake of foods high in solid fats, added sugars, and salt. Eat the right amount of calories for you.Get information about a proper diet from your health care provider, if necessary.  Regular physical exercise is one of the most important things you can do for your health. Most adults should get at least 150 minutes of moderate-intensity exercise (any activity that increases your heart rate and causes you to sweat) each week. In addition, most adults need muscle-strengthening exercises on 2 or more days a week.  Maintain a healthy weight. The body mass index (BMI) is a screening tool to identify possible weight problems. It provides an estimate of body fat based on height and weight. Your health care provider can find your BMI and can help you achieve or maintain a healthy weight.For adults 20 years and older:  A BMI below 18.5 is considered underweight.  A BMI of 18.5 to 24.9 is normal.  A BMI of  25 to 29.9 is considered overweight.  A BMI of 30 and above is considered obese.  Maintain normal blood lipids and cholesterol levels by exercising and minimizing your intake of saturated fat. Eat a balanced diet with plenty of fruit and vegetables. Blood tests for lipids and cholesterol should begin at age 12 and be repeated every 5 years. If your lipid or cholesterol levels are high, you are over 50, or you are at high risk for heart disease, you may need your cholesterol levels checked more frequently.Ongoing high lipid and cholesterol levels should be treated with medicines if diet and exercise are not working.  If you smoke, find out from your health care provider how to quit. If you do not use tobacco, do not start.  Lung cancer screening is recommended for adults aged 55-80 years who are at high risk for developing lung cancer because of a history of smoking. A yearly low-dose CT scan of the lungs is recommended for people who have at least a 30-pack-year history of smoking and are a current smoker or have quit within the past 15 years. A pack year of smoking is smoking an average of 1 pack of cigarettes a day for 1 year (for example: 1 pack a day for 30 years or 2 packs a day for 15 years). Yearly screening should continue until the smoker has stopped smoking for at least 15 years. Yearly screening should be stopped for people who develop a health problem that would prevent them from having lung cancer treatment.  If you are pregnant,  do not drink alcohol. If you are breastfeeding, be very cautious about drinking alcohol. If you are not pregnant and choose to drink alcohol, do not have more than 1 drink per day. One drink is considered to be 12 ounces (355 mL) of beer, 5 ounces (148 mL) of wine, or 1.5 ounces (44 mL) of liquor.  Avoid use of street drugs. Do not share needles with anyone. Ask for help if you need support or instructions about stopping the use of drugs.  High blood pressure  causes heart disease and increases the risk of stroke. Your blood pressure should be checked at least every 1 to 2 years. Ongoing high blood pressure should be treated with medicines if weight loss and exercise do not work.  If you are 63-46 years old, ask your health care provider if you should take aspirin to prevent strokes.  Diabetes screening involves taking a blood sample to check your fasting blood sugar level. This should be done once every 3 years, after age 11, if you are within normal weight and without risk factors for diabetes. Testing should be considered at a younger age or be carried out more frequently if you are overweight and have at least 1 risk factor for diabetes.  Breast cancer screening is essential preventive care for women. You should practice "breast self-awareness." This means understanding the normal appearance and feel of your breasts and may include breast self-examination. Any changes detected, no matter how small, should be reported to a health care provider. Women in their 71s and 30s should have a clinical breast exam (CBE) by a health care provider as part of a regular health exam every 1 to 3 years. After age 62, women should have a CBE every year. Starting at age 25, women should consider having a mammogram (breast X-ray test) every year. Women who have a family history of breast cancer should talk to their health care provider about genetic screening. Women at a high risk of breast cancer should talk to their health care providers about having an MRI and a mammogram every year.  Breast cancer gene (BRCA)-related cancer risk assessment is recommended for women who have family members with BRCA-related cancers. BRCA-related cancers include breast, ovarian, tubal, and peritoneal cancers. Having family members with these cancers may be associated with an increased risk for harmful changes (mutations) in the breast cancer genes BRCA1 and BRCA2. Results of the assessment will  determine the need for genetic counseling and BRCA1 and BRCA2 testing.  Routine pelvic exams to screen for cancer are no longer recommended for nonpregnant women who are considered low risk for cancer of the pelvic organs (ovaries, uterus, and vagina) and who do not have symptoms. Ask your health care provider if a screening pelvic exam is right for you.  If you have had past treatment for cervical cancer or a condition that could lead to cancer, you need Pap tests and screening for cancer for at least 20 years after your treatment. If Pap tests have been discontinued, your risk factors (such as having a new sexual partner) need to be reassessed to determine if screening should be resumed. Some women have medical problems that increase the chance of getting cervical cancer. In these cases, your health care provider may recommend more frequent screening and Pap tests.  The HPV test is an additional test that may be used for cervical cancer screening. The HPV test looks for the virus that can cause the cell changes on the cervix. The  cells collected during the Pap test can be tested for HPV. The HPV test could be used to screen women aged 60 years and older, and should be used in women of any age who have unclear Pap test results. After the age of 50, women should have HPV testing at the same frequency as a Pap test.  Colorectal cancer can be detected and often prevented. Most routine colorectal cancer screening begins at the age of 50 years and continues through age 57 years. However, your health care provider may recommend screening at an earlier age if you have risk factors for colon cancer. On a yearly basis, your health care provider may provide home test kits to check for hidden blood in the stool. Use of a small camera at the end of a tube, to directly examine the colon (sigmoidoscopy or colonoscopy), can detect the earliest forms of colorectal cancer. Talk to your health care provider about this at age  36, when routine screening begins. Direct exam of the colon should be repeated every 5-10 years through age 72 years, unless early forms of pre-cancerous polyps or small growths are found.  People who are at an increased risk for hepatitis B should be screened for this virus. You are considered at high risk for hepatitis B if:  You were born in a country where hepatitis B occurs often. Talk with your health care provider about which countries are considered high risk.  Your parents were born in a high-risk country and you have not received a shot to protect against hepatitis B (hepatitis B vaccine).  You have HIV or AIDS.  You use needles to inject street drugs.  You live with, or have sex with, someone who has hepatitis B.  You get hemodialysis treatment.  You take certain medicines for conditions like cancer, organ transplantation, and autoimmune conditions.  Hepatitis C blood testing is recommended for all people born from [birth year] through 1965 and any individual with known risks for hepatitis C.  Practice safe sex. Use condoms and avoid high-risk sexual practices to reduce the spread of sexually transmitted infections (STIs). STIs include gonorrhea, chlamydia, syphilis, trichomonas, herpes, HPV, and human immunodeficiency virus (HIV). Herpes, HIV, and HPV are viral illnesses that have no cure. They can result in disability, cancer, and death.  You should be screened for sexually transmitted illnesses (STIs) including gonorrhea and chlamydia if:  You are sexually active and are younger than 24 years.  You are older than 24 years and your health care provider tells you that you are at risk for this type of infection.  Your sexual activity has changed since you were last screened and you are at an increased risk for chlamydia or gonorrhea. Ask your health care provider if you are at risk.  If you are at risk of being infected with HIV, it is recommended that you take a prescription  medicine daily to prevent HIV infection. This is called preexposure prophylaxis (PrEP). You are considered at risk if:  You are a heterosexual woman, are sexually active, and are at increased risk for HIV infection.  You take drugs by injection.  You are sexually active with a partner who has HIV.  Talk with your health care provider about whether you are at high risk of being infected with HIV. If you choose to begin PrEP, you should first be tested for HIV. You should then be tested every 3 months for as long as you are taking PrEP.  Osteoporosis is a disease  in which the bones lose minerals and strength with aging. This can result in serious bone fractures or breaks. The risk of osteoporosis can be identified using a bone density scan. Women ages 31 years and over and women at risk for fractures or osteoporosis should discuss screening with their health care providers. Ask your health care provider whether you should take a calcium supplement or vitamin D to reduce the rate of osteoporosis.  Menopause can be associated with physical symptoms and risks. Hormone replacement therapy is available to decrease symptoms and risks. You should talk to your health care provider about whether hormone replacement therapy is right for you.  Use sunscreen. Apply sunscreen liberally and repeatedly throughout the day. You should seek shade when your shadow is shorter than you. Protect yourself by wearing long sleeves, pants, a wide-brimmed hat, and sunglasses year round, whenever you are outdoors.  Once a month, do a whole body skin exam, using a mirror to look at the skin on your back. Tell your health care provider of new moles, moles that have irregular borders, moles that are larger than a pencil eraser, or moles that have changed in shape or color.  Stay current with required vaccines (immunizations).  Influenza vaccine. All adults should be immunized every year.  Tetanus, diphtheria, and acellular  pertussis (Td, Tdap) vaccine. Pregnant women should receive 1 dose of Tdap vaccine during each pregnancy. The dose should be obtained regardless of the length of time since the last dose. Immunization is preferred during the 27th-36th week of gestation. An adult who has not previously received Tdap or who does not know her vaccine status should receive 1 dose of Tdap. This initial dose should be followed by tetanus and diphtheria toxoids (Td) booster doses every 10 years. Adults with an unknown or incomplete history of completing a 3-dose immunization series with Td-containing vaccines should begin or complete a primary immunization series including a Tdap dose. Adults should receive a Td booster every 10 years.  Varicella vaccine. An adult without evidence of immunity to varicella should receive 2 doses or a second dose if she has previously received 1 dose. Pregnant females who do not have evidence of immunity should receive the first dose after pregnancy. This first dose should be obtained before leaving the health care facility. The second dose should be obtained 4-8 weeks after the first dose.  Human papillomavirus (HPV) vaccine. Females aged 13-26 years who have not received the vaccine previously should obtain the 3-dose series. The vaccine is not recommended for use in pregnant females. However, pregnancy testing is not needed before receiving a dose. If a female is found to be pregnant after receiving a dose, no treatment is needed. In that case, the remaining doses should be delayed until after the pregnancy. Immunization is recommended for any person with an immunocompromised condition through the age of 58 years if she did not get any or all doses earlier. During the 3-dose series, the second dose should be obtained 4-8 weeks after the first dose. The third dose should be obtained 24 weeks after the first dose and 16 weeks after the second dose.  Zoster vaccine. One dose is recommended for adults  aged 28 years or older unless certain conditions are present.  Measles, mumps, and rubella (MMR) vaccine. Adults born before [birth year] generally are considered immune to measles and mumps. Adults born in [birth year] or later should have 1 or more doses of MMR vaccine unless there is a contraindication to the vaccine  or there is laboratory evidence of immunity to each of the three diseases. A routine second dose of MMR vaccine should be obtained at least 28 days after the first dose for students attending postsecondary schools, health care workers, or international travelers. People who received inactivated measles vaccine or an unknown type of measles vaccine during 1963-1967 should receive 2 doses of MMR vaccine. People who received inactivated mumps vaccine or an unknown type of mumps vaccine before 1979 and are at high risk for mumps infection should consider immunization with 2 doses of MMR vaccine. For females of childbearing age, rubella immunity should be determined. If there is no evidence of immunity, females who are not pregnant should be vaccinated. If there is no evidence of immunity, females who are pregnant should delay immunization until after pregnancy. Unvaccinated health care workers born before [birth year] who lack laboratory evidence of measles, mumps, or rubella immunity or laboratory confirmation of disease should consider measles and mumps immunization with 2 doses of MMR vaccine or rubella immunization with 1 dose of MMR vaccine.  Pneumococcal 13-valent conjugate (PCV13) vaccine. When indicated, a person who is uncertain of her immunization history and has no record of immunization should receive the PCV13 vaccine. An adult aged 32 years or older who has certain medical conditions and has not been previously immunized should receive 1 dose of PCV13 vaccine. This PCV13 should be followed with a dose of pneumococcal polysaccharide (PPSV23) vaccine. The PPSV23 vaccine dose should be obtained at least 8 weeks  after the dose of PCV13 vaccine. An adult aged 1 years or older who has certain medical conditions and previously received 1 or more doses of PPSV23 vaccine should receive 1 dose of PCV13. The PCV13 vaccine dose should be obtained 1 or more years after the last PPSV23 vaccine dose.  Pneumococcal polysaccharide (PPSV23) vaccine. When PCV13 is also indicated, PCV13 should be obtained first. All adults aged 73 years and older should be immunized. An adult younger than age 65 years who has certain medical conditions should be immunized. Any person who resides in a nursing home or long-term care facility should be immunized. An adult smoker should be immunized. People with an immunocompromised condition and certain other conditions should receive both PCV13 and PPSV23 vaccines. People with human immunodeficiency virus (HIV) infection should be immunized as soon as possible after diagnosis. Immunization during chemotherapy or radiation therapy should be avoided. Routine use of PPSV23 vaccine is not recommended for American Indians, [place] Natives, or people younger than 65 years unless there are medical conditions that require PPSV23 vaccine. When indicated, people who have unknown immunization and have no record of immunization should receive PPSV23 vaccine. One-time revaccination 5 years after the first dose of PPSV23 is recommended for people aged 19-64 years who have chronic kidney failure, nephrotic syndrome, asplenia, or immunocompromised conditions. People who received 1-2 doses of PPSV23 before age 19 years should receive another dose of PPSV23 vaccine at age 64 years or later if at least 5 years have passed since the previous dose. Doses of PPSV23 are not needed for people immunized with PPSV23 at or after age 43 years.  Meningococcal vaccine. Adults with asplenia or persistent complement component deficiencies should receive 2 doses of quadrivalent meningococcal conjugate (MenACWY-D) vaccine. The doses  should be obtained at least 2 months apart. Microbiologists working with certain meningococcal bacteria, [place] recruits, people at risk during an outbreak, and people who travel to or live in countries with a high rate of meningitis should be  immunized. A first-year college student up through age 66 years who is living in a residence hall should receive a dose if she did not receive a dose on or after her 16th birthday. Adults who have certain high-risk conditions should receive one or more doses of vaccine.  Hepatitis A vaccine. Adults who wish to be protected from this disease, have certain high-risk conditions, work with hepatitis A-infected animals, work in hepatitis A research labs, or travel to or work in countries with a high rate of hepatitis A should be immunized. Adults who were previously unvaccinated and who anticipate close contact with an international adoptee during the first 60 days after arrival in the [place] from a country with a high rate of hepatitis A should be immunized.  Hepatitis B vaccine. Adults who wish to be protected from this disease, have certain high-risk conditions, may be exposed to blood or other infectious body fluids, are household contacts or sex partners of hepatitis B positive people, are clients or workers in certain care facilities, or travel to or work in countries with a high rate of hepatitis B should be immunized.  Haemophilus influenzae type b (Hib) vaccine. A previously unvaccinated person with asplenia or sickle cell disease or having a scheduled splenectomy should receive 1 dose of Hib vaccine. Regardless of previous immunization, a recipient of a hematopoietic stem cell transplant should receive a 3-dose series 6-12 months after her successful transplant. Hib vaccine is not recommended for adults with HIV infection. Preventive Services / Frequency Ages 72 to 72 years  Blood pressure check.** / Every 1 to 2 years.  Lipid and cholesterol check.**  / Every 5 years beginning at age 74.  Clinical breast exam.** / Every 3 years for women in their 67s and 58s.  BRCA-related cancer risk assessment.** / For women who have family members with a BRCA-related cancer (breast, ovarian, tubal, or peritoneal cancers).  Pap test.** / Every 2 years from ages 72 through 48. Every 3 years starting at age 73 through age 81 or 42 with a history of 3 consecutive normal Pap tests.  HPV screening.** / Every 3 years from ages 54 through ages 22 to 55 with a history of 3 consecutive normal Pap tests.  Hepatitis C blood test.** / For any individual with known risks for hepatitis C.  Skin self-exam. / Monthly.  Influenza vaccine. / Every year.  Tetanus, diphtheria, and acellular pertussis (Tdap, Td) vaccine.** / Consult your health care provider. Pregnant women should receive 1 dose of Tdap vaccine during each pregnancy. 1 dose of Td every 10 years.  Varicella vaccine.** / Consult your health care provider. Pregnant females who do not have evidence of immunity should receive the first dose after pregnancy.  HPV vaccine. / 3 doses over 6 months, if 36 and younger. The vaccine is not recommended for use in pregnant females. However, pregnancy testing is not needed before receiving a dose.  Measles, mumps, rubella (MMR) vaccine.** / You need at least 1 dose of MMR if you were born in 1957 or later. You may also need a 2nd dose. For females of childbearing age, rubella immunity should be determined. If there is no evidence of immunity, females who are not pregnant should be vaccinated. If there is no evidence of immunity, females who are pregnant should delay immunization until after pregnancy.  Pneumococcal 13-valent conjugate (PCV13) vaccine.** / Consult your health care provider.  Pneumococcal polysaccharide (PPSV23) vaccine.** / 1 to 2 doses if you smoke cigarettes or  if you have certain conditions.  Meningococcal vaccine.** / 1 dose if you are age 26 to 30  years and a Market researcher living in a residence hall, or have one of several medical conditions, you need to get vaccinated against meningococcal disease. You may also need additional booster doses.  Hepatitis A vaccine.** / Consult your health care provider.  Hepatitis B vaccine.** / Consult your health care provider.  Haemophilus influenzae type b (Hib) vaccine.** / Consult your health care provider. Ages 74 to 108 years  Blood pressure check.** / Every 1 to 2 years.  Lipid and cholesterol check.** / Every 5 years beginning at age 21 years.  Lung cancer screening. / Every year if you are aged 74-80 years and have a 30-pack-year history of smoking and currently smoke or have quit within the past 15 years. Yearly screening is stopped once you have quit smoking for at least 15 years or develop a health problem that would prevent you from having lung cancer treatment.  Clinical breast exam.** / Every year after age 65 years.  BRCA-related cancer risk assessment.** / For women who have family members with a BRCA-related cancer (breast, ovarian, tubal, or peritoneal cancers).  Mammogram.** / Every year beginning at age 32 years and continuing for as long as you are in good health. Consult with your health care provider.  Pap test.** / Every 3 years starting at age 74 years through age 71 or 63 years with a history of 3 consecutive normal Pap tests.  HPV screening.** / Every 3 years from ages 16 years through ages 52 to 32 years with a history of 3 consecutive normal Pap tests.  Fecal occult blood test (FOBT) of stool. / Every year beginning at age 87 years and continuing until age 54 years. You may not need to do this test if you get a colonoscopy every 10 years.  Flexible sigmoidoscopy or colonoscopy.** / Every 5 years for a flexible sigmoidoscopy or every 10 years for a colonoscopy beginning at age 22 years and continuing until age 39 years.  Hepatitis C blood test.** / For  all people born from [birth year] through 1963 and any individual with known risks for hepatitis C.  Skin self-exam. / Monthly.  Influenza vaccine. / Every year.  Tetanus, diphtheria, and acellular pertussis (Tdap/Td) vaccine.** / Consult your health care provider. Pregnant women should receive 1 dose of Tdap vaccine during each pregnancy. 1 dose of Td every 10 years.  Varicella vaccine.** / Consult your health care provider. Pregnant females who do not have evidence of immunity should receive the first dose after pregnancy.  Zoster vaccine.** / 1 dose for adults aged 22 years or older.  Measles, mumps, rubella (MMR) vaccine.** / You need at least 1 dose of MMR if you were born in 1957 or later. You may also need a 2nd dose. For females of childbearing age, rubella immunity should be determined. If there is no evidence of immunity, females who are not pregnant should be vaccinated. If there is no evidence of immunity, females who are pregnant should delay immunization until after pregnancy.  Pneumococcal 13-valent conjugate (PCV13) vaccine.** / Consult your health care provider.  Pneumococcal polysaccharide (PPSV23) vaccine.** / 1 to 2 doses if you smoke cigarettes or if you have certain conditions.  Meningococcal vaccine.** / Consult your health care provider.  Hepatitis A vaccine.** / Consult your health care provider.  Hepatitis B vaccine.** / Consult your health care provider.  Haemophilus influenzae type b (Hib)  vaccine.** / Consult your health care provider. Ages 63 years and over  Blood pressure check.** / Every 1 to 2 years.  Lipid and cholesterol check.** / Every 5 years beginning at age 66 years.  Lung cancer screening. / Every year if you are aged 44-80 years and have a 30-pack-year history of smoking and currently smoke or have quit within the past 15 years. Yearly screening is stopped once you have quit smoking for at least 15 years or develop a health problem that would prevent  you from having lung cancer treatment.  Clinical breast exam.** / Every year after age 58 years.  BRCA-related cancer risk assessment.** / For women who have family members with a BRCA-related cancer (breast, ovarian, tubal, or peritoneal cancers).  Mammogram.** / Every year beginning at age 104 years and continuing for as long as you are in good health. Consult with your health care provider.  Pap test.** / Every 3 years starting at age 64 years through age 88 or 34 years with 3 consecutive normal Pap tests. Testing can be stopped between 65 and 70 years with 3 consecutive normal Pap tests and no abnormal Pap or HPV tests in the past 10 years.  HPV screening.** / Every 3 years from ages 74 years through ages 46 or 40 years with a history of 3 consecutive normal Pap tests. Testing can be stopped between 65 and 70 years with 3 consecutive normal Pap tests and no abnormal Pap or HPV tests in the past 10 years.  Fecal occult blood test (FOBT) of stool. / Every year beginning at age 81 years and continuing until age 80 years. You may not need to do this test if you get a colonoscopy every 10 years.  Flexible sigmoidoscopy or colonoscopy.** / Every 5 years for a flexible sigmoidoscopy or every 10 years for a colonoscopy beginning at age 25 years and continuing until age 48 years.  Hepatitis C blood test.** / For all people born from [birth year] through 1963 and any individual with known risks for hepatitis C.  Osteoporosis screening.** / A one-time screening for women ages 28 years and over and women at risk for fractures or osteoporosis.  Skin self-exam. / Monthly.  Influenza vaccine. / Every year.  Tetanus, diphtheria, and acellular pertussis (Tdap/Td) vaccine.** / 1 dose of Td every 10 years.  Varicella vaccine.** / Consult your health care provider.  Zoster vaccine.** / 1 dose for adults aged 77 years or older.  Pneumococcal 13-valent conjugate (PCV13) vaccine.** / Consult your health care  provider.  Pneumococcal polysaccharide (PPSV23) vaccine.** / 1 dose for all adults aged 70 years and older.  Meningococcal vaccine.** / Consult your health care provider.  Hepatitis A vaccine.** / Consult your health care provider.  Hepatitis B vaccine.** / Consult your health care provider.  Haemophilus influenzae type b (Hib) vaccine.** / Consult your health care provider. ** Family history and personal history of risk and conditions may change your health care provider's recommendations. Document Released: 07/06/2001 Document Revised: 09/24/2013 Document Reviewed: 10/05/2010 [facility] Patient Information 2015 [place], [place]. This information is not intended to replace advice given to you by your health care provider. Make sure you discuss any questions you have with your health care provider.

## 2014-07-12 ENCOUNTER — Telehealth: Payer: Self-pay | Admitting: Family Medicine

## 2014-07-12 DIAGNOSIS — G309 Alzheimer's disease, unspecified: Secondary | ICD-10-CM | POA: Diagnosis not present

## 2014-07-12 DIAGNOSIS — F418 Other specified anxiety disorders: Secondary | ICD-10-CM | POA: Diagnosis not present

## 2014-07-12 DIAGNOSIS — K219 Gastro-esophageal reflux disease without esophagitis: Secondary | ICD-10-CM | POA: Diagnosis not present

## 2014-07-12 DIAGNOSIS — I10 Essential (primary) hypertension: Secondary | ICD-10-CM | POA: Diagnosis not present

## 2014-07-12 DIAGNOSIS — J45901 Unspecified asthma with (acute) exacerbation: Secondary | ICD-10-CM | POA: Diagnosis not present

## 2014-07-12 DIAGNOSIS — J441 Chronic obstructive pulmonary disease with (acute) exacerbation: Secondary | ICD-10-CM | POA: Diagnosis not present

## 2014-07-12 LAB — URINALYSIS
Bilirubin Urine: NEGATIVE
Glucose, UA: NEGATIVE mg/dL
Hgb urine dipstick: NEGATIVE
Ketones, ur: NEGATIVE mg/dL
LEUKOCYTES UA: NEGATIVE
NITRITE: NEGATIVE
PH: 5 (ref 5.0–8.0)
Protein, ur: NEGATIVE mg/dL
Specific Gravity, Urine: 1.027 (ref 1.005–1.030)
Urobilinogen, UA: 0.2 mg/dL (ref 0.0–1.0)

## 2014-07-12 NOTE — Telephone Encounter (Signed)
Caller name: Carie, Kapuscinski Relation to pt: self  Call back number: [phone]   Reason for call:  Pt was seen by MD 07/11/14 and wanted to inform MD of neurologist Dr. Merlene Laughter

## 2014-07-13 LAB — URINE CULTURE: Colony Count: 25000

## 2014-07-15 ENCOUNTER — Other Ambulatory Visit (INDEPENDENT_AMBULATORY_CARE_PROVIDER_SITE_OTHER): Payer: 59

## 2014-07-15 ENCOUNTER — Telehealth: Payer: Self-pay | Admitting: Family Medicine

## 2014-07-15 DIAGNOSIS — R300 Dysuria: Secondary | ICD-10-CM

## 2014-07-15 LAB — URINALYSIS
Bilirubin Urine: NEGATIVE
HGB URINE DIPSTICK: NEGATIVE
Ketones, ur: NEGATIVE
LEUKOCYTES UA: NEGATIVE
Nitrite: NEGATIVE
TOTAL PROTEIN, URINE-UPE24: NEGATIVE
Urine Glucose: NEGATIVE
Urobilinogen, UA: 0.2 (ref 0.0–1.0)
pH: 5.5 (ref 5.0–8.0)

## 2014-07-15 NOTE — Telephone Encounter (Signed)
Called left message to call back 

## 2014-07-15 NOTE — Telephone Encounter (Signed)
Caller name: Fionnuala Relation to pt: Call back number: [phone] Pharmacy: [id number] [place], [place] - [address]  Reason for call:  Pt requesting refill on traMADol (ULTRAM) 50 MG tablet. Please advise.

## 2014-07-15 NOTE — Telephone Encounter (Signed)
Patient has been informed of her urine test results dated 07/11/14.

## 2014-07-15 NOTE — Telephone Encounter (Signed)
Called back and did inform the grandson Neomia Glass of PCP instructions on refill.

## 2014-07-15 NOTE — Telephone Encounter (Signed)
Last refill dated 06/27/14 #30 with 0 refills.

## 2014-07-15 NOTE — Telephone Encounter (Signed)
Patient did not understand the message please call again

## 2014-07-15 NOTE — Telephone Encounter (Signed)
Not due until next week. She needs an appt to discuss change in amount of med prescribed

## 2014-07-15 NOTE — Telephone Encounter (Signed)
Urine test ordered.

## 2014-07-16 ENCOUNTER — Telehealth: Payer: Self-pay | Admitting: Family Medicine

## 2014-07-16 NOTE — Telephone Encounter (Signed)
OK to give verbal order to extend therapy for 2 more weeks.

## 2014-07-16 NOTE — Telephone Encounter (Signed)
Caller name: Tillie Rung from [facility]  Relation: Physical Therapist  Call back number: [phone]   Reason for call:  Verbal orders to extend twice a week for 2 weeks. Please advise

## 2014-07-17 DIAGNOSIS — J45901 Unspecified asthma with (acute) exacerbation: Secondary | ICD-10-CM | POA: Diagnosis not present

## 2014-07-17 DIAGNOSIS — G309 Alzheimer's disease, unspecified: Secondary | ICD-10-CM | POA: Diagnosis not present

## 2014-07-17 DIAGNOSIS — I10 Essential (primary) hypertension: Secondary | ICD-10-CM | POA: Diagnosis not present

## 2014-07-17 DIAGNOSIS — F418 Other specified anxiety disorders: Secondary | ICD-10-CM | POA: Diagnosis not present

## 2014-07-17 DIAGNOSIS — K219 Gastro-esophageal reflux disease without esophagitis: Secondary | ICD-10-CM | POA: Diagnosis not present

## 2014-07-17 DIAGNOSIS — J441 Chronic obstructive pulmonary disease with (acute) exacerbation: Secondary | ICD-10-CM | POA: Diagnosis not present

## 2014-07-17 LAB — URINE CULTURE: Colony Count: 70000

## 2014-07-17 NOTE — Telephone Encounter (Signed)
Called [facility] informed of PCP verbal ok to extend PT for 2 more weeks.

## 2014-07-18 ENCOUNTER — Other Ambulatory Visit: Payer: Self-pay | Admitting: Family Medicine

## 2014-07-18 NOTE — Telephone Encounter (Signed)
Requesting Tramadol 50mg -Take 1 tablet by mouth twice a day. Last refill:06/27/14;#30,0 Last OV:07/11/14 Need CSC-UDS given (07/11/14) Please advise.//AB/CMA

## 2014-07-19 DIAGNOSIS — J441 Chronic obstructive pulmonary disease with (acute) exacerbation: Secondary | ICD-10-CM | POA: Diagnosis not present

## 2014-07-19 DIAGNOSIS — J45901 Unspecified asthma with (acute) exacerbation: Secondary | ICD-10-CM | POA: Diagnosis not present

## 2014-07-19 DIAGNOSIS — F418 Other specified anxiety disorders: Secondary | ICD-10-CM | POA: Diagnosis not present

## 2014-07-19 DIAGNOSIS — I10 Essential (primary) hypertension: Secondary | ICD-10-CM | POA: Diagnosis not present

## 2014-07-19 DIAGNOSIS — G309 Alzheimer's disease, unspecified: Secondary | ICD-10-CM | POA: Diagnosis not present

## 2014-07-19 DIAGNOSIS — K219 Gastro-esophageal reflux disease without esophagitis: Secondary | ICD-10-CM | POA: Diagnosis not present

## 2014-07-19 NOTE — Telephone Encounter (Signed)
Faxed hardcopy for Tramad[facility] to [facility] 

## 2014-07-22 ENCOUNTER — Telehealth: Payer: Self-pay | Admitting: *Deleted

## 2014-07-22 ENCOUNTER — Encounter: Payer: Self-pay | Admitting: Family Medicine

## 2014-07-22 DIAGNOSIS — D649 Anemia, unspecified: Secondary | ICD-10-CM

## 2014-07-22 HISTORY — DX: Anemia, unspecified: D64.9

## 2014-07-22 NOTE — Assessment & Plan Note (Signed)
Tolerating Aricept and they report she appears to be somewhat better

## 2014-07-22 NOTE — Assessment & Plan Note (Signed)
Mild, stable, Increase leafy greens, consider increased lean red meat and using cast iron cookware. Continue to monitor, report any concerns

## 2014-07-22 NOTE — Assessment & Plan Note (Signed)
Avoid offending foods, start probiotics. Do not eat large meals in late evening and consider raising head of bed.  

## 2014-07-22 NOTE — Assessment & Plan Note (Signed)
They report she is improving on combo of Citalopram and Seroquel, continue same

## 2014-07-22 NOTE — Assessment & Plan Note (Signed)
Well controlled, no changes to meds. Encouraged heart healthy diet such as the DASH diet and exercise as tolerated.  °
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
## 2014-07-22 NOTE — Telephone Encounter (Signed)
Received addendum to home health certification and plan of care via fax from [place]. Forwarded to Dr. Charlett Blake. JG//CMA

## 2014-07-22 NOTE — Assessment & Plan Note (Signed)
Has been participating in PT at home and has gotten a Administrator, [employer]. Lives with her grandson, no falls since starting therapy. They report when she is fatigued she has trouble with her left leg drags, this is long standing

## 2014-07-23 ENCOUNTER — Telehealth: Payer: Self-pay | Admitting: Family Medicine

## 2014-07-23 DIAGNOSIS — F418 Other specified anxiety disorders: Secondary | ICD-10-CM | POA: Diagnosis not present

## 2014-07-23 DIAGNOSIS — R197 Diarrhea, unspecified: Secondary | ICD-10-CM

## 2014-07-23 DIAGNOSIS — J441 Chronic obstructive pulmonary disease with (acute) exacerbation: Secondary | ICD-10-CM | POA: Diagnosis not present

## 2014-07-23 DIAGNOSIS — K219 Gastro-esophageal reflux disease without esophagitis: Secondary | ICD-10-CM | POA: Diagnosis not present

## 2014-07-23 DIAGNOSIS — I10 Essential (primary) hypertension: Secondary | ICD-10-CM | POA: Diagnosis not present

## 2014-07-23 DIAGNOSIS — G309 Alzheimer's disease, unspecified: Secondary | ICD-10-CM | POA: Diagnosis not present

## 2014-07-23 DIAGNOSIS — J45901 Unspecified asthma with (acute) exacerbation: Secondary | ICD-10-CM | POA: Diagnosis not present

## 2014-07-23 NOTE — Telephone Encounter (Signed)
How many episodes of diarrhea daily, any blood any fevers, is she eating and drinking if any concerns should have visit. If otherwise feeling well can have her come in for stool culture, O&P, stool for WBC and stool for CDiff by PCR.

## 2014-07-23 NOTE — Telephone Encounter (Signed)
Has had diarrhea for 3 weeks

## 2014-07-24 NOTE — Telephone Encounter (Signed)
Form faxed to [facility]. JG//CMA

## 2014-07-25 ENCOUNTER — Other Ambulatory Visit (INDEPENDENT_AMBULATORY_CARE_PROVIDER_SITE_OTHER): Payer: 59

## 2014-07-25 ENCOUNTER — Telehealth: Payer: Self-pay | Admitting: Family Medicine

## 2014-07-25 DIAGNOSIS — I10 Essential (primary) hypertension: Secondary | ICD-10-CM | POA: Diagnosis not present

## 2014-07-25 DIAGNOSIS — G309 Alzheimer's disease, unspecified: Secondary | ICD-10-CM | POA: Diagnosis not present

## 2014-07-25 DIAGNOSIS — R197 Diarrhea, unspecified: Secondary | ICD-10-CM | POA: Diagnosis not present

## 2014-07-25 DIAGNOSIS — K219 Gastro-esophageal reflux disease without esophagitis: Secondary | ICD-10-CM | POA: Diagnosis not present

## 2014-07-25 DIAGNOSIS — J441 Chronic obstructive pulmonary disease with (acute) exacerbation: Secondary | ICD-10-CM | POA: Diagnosis not present

## 2014-07-25 DIAGNOSIS — J45901 Unspecified asthma with (acute) exacerbation: Secondary | ICD-10-CM | POA: Diagnosis not present

## 2014-07-25 DIAGNOSIS — F418 Other specified anxiety disorders: Secondary | ICD-10-CM | POA: Diagnosis not present

## 2014-07-25 NOTE — Telephone Encounter (Signed)
Thanks for ordering! 

## 2014-07-25 NOTE — Telephone Encounter (Signed)
Stool cultures ordered.//AB/CMA

## 2014-07-25 NOTE — Telephone Encounter (Signed)
Caller name: Taisa Relation to FO:YDXA Call back number:[phone] Pharmacy:  Reason for call: PT states that her nurse mentioned that she needs a new walker, the walker that she has, has a lose wheel. Please advise.

## 2014-07-25 NOTE — Telephone Encounter (Signed)
Patient called back stating that she will come in for a stool sample. Please order labs.

## 2014-07-25 NOTE — Telephone Encounter (Signed)
Patient states that she is eating some and drinking, no fever, no bloody stool, does not have a way to get to the office. I advised that I would put a stool card in the mail till she can get in for the remainder of labs. What would you like to check for off the stool cards?

## 2014-07-25 NOTE — Telephone Encounter (Signed)
She needs a visit to document medical necessity for walker

## 2014-07-26 ENCOUNTER — Other Ambulatory Visit: Payer: Self-pay | Admitting: Family Medicine

## 2014-07-26 DIAGNOSIS — J449 Chronic obstructive pulmonary disease, unspecified: Secondary | ICD-10-CM | POA: Diagnosis not present

## 2014-07-26 DIAGNOSIS — R197 Diarrhea, unspecified: Secondary | ICD-10-CM | POA: Diagnosis not present

## 2014-07-26 NOTE — Telephone Encounter (Signed)
Called the patient informed of PCP instructions.  She stated she does already have an appt. Scheduled for September 09, 2014 and thinks she can wait until that appt. But will call to reschedule before if needs to.

## 2014-07-27 LAB — C. DIFFICILE GDH AND TOXIN A/B
C. difficile GDH: NOT DETECTED
C. difficile Toxin A/B: NOT DETECTED

## 2014-07-27 LAB — FECAL LACTOFERRIN, QUANT: Lactoferrin: POSITIVE

## 2014-07-29 LAB — OVA AND PARASITE EXAMINATION: OP: NONE SEEN

## 2014-07-30 ENCOUNTER — Other Ambulatory Visit: Payer: Self-pay | Admitting: Family Medicine

## 2014-07-30 LAB — STOOL CULTURE

## 2014-07-30 MED ORDER — FLUCONAZOLE 150 MG PO TABS: ORAL_TABLET | ORAL | Status: DC

## 2014-08-07 ENCOUNTER — Other Ambulatory Visit: Payer: Self-pay | Admitting: Family Medicine

## 2014-08-07 NOTE — Telephone Encounter (Signed)
Requesting Tramadol 50mg -Take 1 tablet by mouth twice a day.                     Seroquel 25mg -Take 1/2 tablet by mouth at bedtime Last refill:07-18-14;#30,0                04-15-14;#15,4 Last OV:07-11-14-appt:09-09-14 UDS:07-11-14-Low risk-Needs CSC signed Please advise.//AB/CMA

## 2014-08-08 NOTE — Telephone Encounter (Signed)
Faxed hardcopy [facility] at [phone]

## 2014-08-12 ENCOUNTER — Other Ambulatory Visit: Payer: Self-pay | Admitting: *Deleted

## 2014-08-12 ENCOUNTER — Emergency Department (HOSPITAL_COMMUNITY): Payer: Medicare Other

## 2014-08-12 ENCOUNTER — Emergency Department (HOSPITAL_COMMUNITY)
Admission: EM | Admit: 2014-08-12 | Discharge: 2014-08-12 | Disposition: A | Discharge status: Home / Self Care | Payer: Medicare Other | Attending: Emergency Medicine | Admitting: Emergency Medicine

## 2014-08-12 ENCOUNTER — Encounter (HOSPITAL_COMMUNITY): Payer: Self-pay | Admitting: Emergency Medicine

## 2014-08-12 DIAGNOSIS — Z8669 Personal history of other diseases of the nervous system and sense organs: Secondary | ICD-10-CM | POA: Diagnosis not present

## 2014-08-12 DIAGNOSIS — M25562 Pain in left knee: Secondary | ICD-10-CM | POA: Diagnosis not present

## 2014-08-12 DIAGNOSIS — Z87891 Personal history of nicotine dependence: Secondary | ICD-10-CM | POA: Insufficient documentation

## 2014-08-12 DIAGNOSIS — S20211A Contusion of right front wall of thorax, initial encounter: Secondary | ICD-10-CM

## 2014-08-12 DIAGNOSIS — S8992XA Unspecified injury of left lower leg, initial encounter: Secondary | ICD-10-CM | POA: Diagnosis not present

## 2014-08-12 DIAGNOSIS — M13852 Other specified arthritis, left hip: Secondary | ICD-10-CM | POA: Diagnosis not present

## 2014-08-12 DIAGNOSIS — S0990XA Unspecified injury of head, initial encounter: Secondary | ICD-10-CM | POA: Diagnosis not present

## 2014-08-12 DIAGNOSIS — Y9389 Activity, other specified: Secondary | ICD-10-CM | POA: Insufficient documentation

## 2014-08-12 DIAGNOSIS — S4991XA Unspecified injury of right shoulder and upper arm, initial encounter: Secondary | ICD-10-CM | POA: Diagnosis not present

## 2014-08-12 DIAGNOSIS — Y92009 Unspecified place in unspecified non-institutional (private) residence as the place of occurrence of the external cause: Secondary | ICD-10-CM

## 2014-08-12 DIAGNOSIS — E669 Obesity, unspecified: Secondary | ICD-10-CM | POA: Diagnosis not present

## 2014-08-12 DIAGNOSIS — S199XXA Unspecified injury of neck, initial encounter: Secondary | ICD-10-CM | POA: Diagnosis not present

## 2014-08-12 DIAGNOSIS — Z79899 Other long term (current) drug therapy: Secondary | ICD-10-CM | POA: Diagnosis not present

## 2014-08-12 DIAGNOSIS — M1612 Unilateral primary osteoarthritis, left hip: Secondary | ICD-10-CM

## 2014-08-12 DIAGNOSIS — J449 Chronic obstructive pulmonary disease, unspecified: Secondary | ICD-10-CM | POA: Insufficient documentation

## 2014-08-12 DIAGNOSIS — K219 Gastro-esophageal reflux disease without esophagitis: Secondary | ICD-10-CM | POA: Insufficient documentation

## 2014-08-12 DIAGNOSIS — I10 Essential (primary) hypertension: Secondary | ICD-10-CM | POA: Diagnosis not present

## 2014-08-12 DIAGNOSIS — S299XXA Unspecified injury of thorax, initial encounter: Secondary | ICD-10-CM | POA: Diagnosis not present

## 2014-08-12 DIAGNOSIS — Y998 Other external cause status: Secondary | ICD-10-CM | POA: Diagnosis not present

## 2014-08-12 DIAGNOSIS — W010XXA Fall on same level from slipping, tripping and stumbling without subsequent striking against object, initial encounter: Secondary | ICD-10-CM | POA: Insufficient documentation

## 2014-08-12 DIAGNOSIS — Z862 Personal history of diseases of the blood and blood-forming organs and certain disorders involving the immune mechanism: Secondary | ICD-10-CM | POA: Insufficient documentation

## 2014-08-12 DIAGNOSIS — R0781 Pleurodynia: Secondary | ICD-10-CM | POA: Diagnosis not present

## 2014-08-12 DIAGNOSIS — R079 Chest pain, unspecified: Secondary | ICD-10-CM | POA: Diagnosis not present

## 2014-08-12 DIAGNOSIS — Y9289 Other specified places as the place of occurrence of the external cause: Secondary | ICD-10-CM | POA: Insufficient documentation

## 2014-08-12 DIAGNOSIS — R52 Pain, unspecified: Secondary | ICD-10-CM

## 2014-08-12 DIAGNOSIS — M25511 Pain in right shoulder: Secondary | ICD-10-CM | POA: Diagnosis not present

## 2014-08-12 DIAGNOSIS — R071 Chest pain on breathing: Secondary | ICD-10-CM | POA: Diagnosis not present

## 2014-08-12 DIAGNOSIS — M25552 Pain in left hip: Secondary | ICD-10-CM | POA: Diagnosis not present

## 2014-08-12 DIAGNOSIS — W19XXXA Unspecified fall, initial encounter: Secondary | ICD-10-CM

## 2014-08-12 DIAGNOSIS — M1990 Unspecified osteoarthritis, unspecified site: Secondary | ICD-10-CM | POA: Insufficient documentation

## 2014-08-12 DIAGNOSIS — S79912A Unspecified injury of left hip, initial encounter: Secondary | ICD-10-CM | POA: Diagnosis not present

## 2014-08-12 LAB — CBC WITH DIFFERENTIAL/PLATELET
BASOS ABS: 0 10*3/uL (ref 0.0–0.1)
BASOS PCT: 1 % (ref 0–1)
EOS PCT: 4 % (ref 0–5)
Eosinophils Absolute: 0.3 10*3/uL (ref 0.0–0.7)
HCT: 40.3 % (ref 36.0–46.0)
HEMOGLOBIN: 12.6 g/dL (ref 12.0–15.0)
LYMPHS PCT: 36 % (ref 12–46)
Lymphs Abs: 2.8 10*3/uL (ref 0.7–4.0)
MCH: 29.1 pg (ref 26.0–34.0)
MCHC: 31.3 g/dL (ref 30.0–36.0)
MCV: 93.1 fL (ref 78.0–100.0)
Monocytes Absolute: 0.6 10*3/uL (ref 0.1–1.0)
Monocytes Relative: 8 % (ref 3–12)
Neutro Abs: 4 10*3/uL (ref 1.7–7.7)
Neutrophils Relative %: 51 % (ref 43–77)
Platelets: 201 10*3/uL (ref 150–400)
RBC: 4.33 MIL/uL (ref 3.87–5.11)
RDW: 13.4 % (ref 11.5–15.5)
WBC: 7.8 10*3/uL (ref 4.0–10.5)

## 2014-08-12 LAB — BASIC METABOLIC PANEL
ANION GAP: 8 (ref 5–15)
BUN: 8 mg/dL (ref 6–23)
CHLORIDE: 102 mmol/L (ref 96–112)
CO2: 33 mmol/L — AB (ref 19–32)
CREATININE: 0.83 mg/dL (ref 0.50–1.10)
Calcium: 8.9 mg/dL (ref 8.4–10.5)
GFR calc Af Amer: 77 mL/min — ABNORMAL LOW (ref >90)
GFR calc non Af Amer: 66 mL/min — ABNORMAL LOW (ref >90)
Glucose, Bld: 80 mg/dL (ref 70–99)
POTASSIUM: 3.9 mmol/L (ref 3.5–5.1)
Sodium: 143 mmol/L (ref 135–145)

## 2014-08-12 LAB — URINALYSIS, ROUTINE W REFLEX MICROSCOPIC
Bilirubin Urine: NEGATIVE
Glucose, UA: NEGATIVE mg/dL
HGB URINE DIPSTICK: NEGATIVE
Ketones, ur: NEGATIVE mg/dL
NITRITE: NEGATIVE
Protein, ur: NEGATIVE mg/dL
SPECIFIC GRAVITY, URINE: 1.009 (ref 1.005–1.030)
Urobilinogen, UA: 0.2 mg/dL (ref 0.0–1.0)
pH: 6.5 (ref 5.0–8.0)

## 2014-08-12 LAB — URINE MICROSCOPIC-ADD ON

## 2014-08-12 MED ORDER — SODIUM CHLORIDE 0.9 % IV BOLUS (SEPSIS)
500.0000 mL | Freq: Once | INTRAVENOUS | Status: AC
  Administered 2014-08-12: 500 mL via INTRAVENOUS

## 2014-08-12 MED ORDER — OXYCODONE-ACETAMINOPHEN 5-325 MG PO TABS: ORAL_TABLET | ORAL | Status: DC

## 2014-08-12 MED ORDER — OXYCODONE-ACETAMINOPHEN 5-325 MG PO TABS
1.0000 | ORAL_TABLET | Freq: Once | ORAL | Status: AC
Start: 2014-08-12 — End: 2014-08-12
  Administered 2014-08-12: 1 via ORAL
  Filled 2014-08-12: qty 1

## 2014-08-12 MED ORDER — OXYCODONE-ACETAMINOPHEN 5-325 MG PO TABS
1.0000 | ORAL_TABLET | Freq: Once | ORAL | Status: AC
  Administered 2014-08-12: 1 via ORAL
  Filled 2014-08-12: qty 1

## 2014-08-12 NOTE — ED Notes (Signed)
80 yo from home with fall on her deck this morning. Pain to the right rib, small hematoma. No bleeding. Pt is alert and oriented. Vitals are stable.

## 2014-08-12 NOTE — ED Notes (Signed)
Portable xray being preformed.

## 2014-08-12 NOTE — ED Notes (Signed)
Patient ambulated 40 feet with the help of a walker.

## 2014-08-12 NOTE — ED Notes (Signed)
Bed: VV61 Expected date: 08/12/14 Expected time:  Means of arrival:  Comments: NO MONITOR
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
## 2014-08-12 NOTE — ED Notes (Signed)
Tech and this RN attempted to ambulate pt. Unable to do so at this time.

## 2014-08-12 NOTE — Discharge Instructions (Signed)
Take percocet for breakthrough pain, do not drink alcohol, drive, care for children or do other critical tasks while taking percocet.   It is very important that you take deep breaths to prevent lung collapse and infection.  Take 10 deep breaths every hour to prevent lung collapse.  If you develop cough, fever or shortness of breath return immediately to the emergency room.  Please be very careful not to fall! The pain medication and hip pain puts you at risk for falls. Please rest as much as possible and try to not stay alone.

## 2014-08-12 NOTE — ED Notes (Signed)
Bed: [facility] Expected date:  Expected time:  Means of arrival:  Comments: ems 

## 2014-08-12 NOTE — Progress Notes (Addendum)
  CARE MANAGEMENT ED NOTE 08/12/2014  Patient:  [name], [name]   Account Number:  [id number]  Date Initiated:  08/12/2014  Documentation initiated by:  Jackelyn Poling  Subjective/Objective Assessment:   80 yr old [employer] from home with fall on her deck this morning Pain to the right rib, small hematoma. No bleeding. Pt is alert and oriented. Vitals are stable     Subjective/Objective Assessment Detail:   pcp stacey blyth  Pt has oxygen at home, a cane, a walker  Pt requesting a 3n1 and w/c for home use  Pt observed walking with ED staff x 2 with walker in hall to room Pt reported lots of pain after walking and return to bed with use of oxygen     Action/Plan:   CM noted CM consult Spoke with pt and female family at bedside about DME needs Orders entered Orders and clinicals faxed to [facility]   Action/Plan Detail:   Anticipated DC Date:  08/12/2014     Status Recommendation to Physician:   Result of Recommendation:    Other ED Services  Consult Working [place]  Other  Outpatient Services - Pt will follow up   [place]   Choice offered to / List presented to:    DME arranged  [place]  3-N-1     DME agency  [place].        Status of service:  Completed, signed off  ED Comments:   ED Comments Detail:     08/12/14 1805 fax confirmation of clinicals, orders for w/c & 3N1 sent to [facility] [phone] received

## 2014-08-12 NOTE — ED Notes (Signed)
Arm band applied.

## 2014-08-14 DIAGNOSIS — J449 Chronic obstructive pulmonary disease, unspecified: Secondary | ICD-10-CM | POA: Diagnosis not present

## 2014-08-15 ENCOUNTER — Ambulatory Visit (INDEPENDENT_AMBULATORY_CARE_PROVIDER_SITE_OTHER): Payer: Medicare Other | Admitting: Medical

## 2014-08-15 ENCOUNTER — Ambulatory Visit (HOSPITAL_BASED_OUTPATIENT_CLINIC_OR_DEPARTMENT_OTHER)
Admission: RE | Admit: 2014-08-15 | Discharge: 2014-08-15 | Disposition: A | Discharge status: Home / Self Care | Payer: Medicare Other | Source: Ambulatory Visit | Attending: Medical | Admitting: Medical

## 2014-08-15 ENCOUNTER — Encounter: Payer: Self-pay | Admitting: Medical

## 2014-08-15 VITALS — BP 128/60 | HR 66 | Temp 98.1°F | Ht 63.0 in | Wt 174.0 lb

## 2014-08-15 DIAGNOSIS — R059 Cough, unspecified: Secondary | ICD-10-CM | POA: Insufficient documentation

## 2014-08-15 DIAGNOSIS — S299XXA Unspecified injury of thorax, initial encounter: Secondary | ICD-10-CM | POA: Diagnosis not present

## 2014-08-15 DIAGNOSIS — N949 Unspecified condition associated with female genital organs and menstrual cycle: Secondary | ICD-10-CM

## 2014-08-15 DIAGNOSIS — M25572 Pain in left ankle and joints of left foot: Secondary | ICD-10-CM | POA: Insufficient documentation

## 2014-08-15 DIAGNOSIS — S99912A Unspecified injury of left ankle, initial encounter: Secondary | ICD-10-CM | POA: Diagnosis not present

## 2014-08-15 DIAGNOSIS — R05 Cough: Secondary | ICD-10-CM

## 2014-08-15 DIAGNOSIS — M546 Pain in thoracic spine: Secondary | ICD-10-CM

## 2014-08-15 DIAGNOSIS — W19XXXA Unspecified fall, initial encounter: Secondary | ICD-10-CM | POA: Diagnosis not present

## 2014-08-15 DIAGNOSIS — M898X8 Other specified disorders of bone, other site: Secondary | ICD-10-CM | POA: Insufficient documentation

## 2014-08-15 DIAGNOSIS — J449 Chronic obstructive pulmonary disease, unspecified: Secondary | ICD-10-CM | POA: Diagnosis not present

## 2014-08-15 DIAGNOSIS — M85872 Other specified disorders of bone density and structure, left ankle and foot: Secondary | ICD-10-CM | POA: Diagnosis not present

## 2014-08-15 DIAGNOSIS — K219 Gastro-esophageal reflux disease without esophagitis: Secondary | ICD-10-CM | POA: Insufficient documentation

## 2014-08-15 DIAGNOSIS — R102 Pelvic and perineal pain: Secondary | ICD-10-CM

## 2014-08-15 DIAGNOSIS — R062 Wheezing: Secondary | ICD-10-CM

## 2014-08-15 DIAGNOSIS — J45909 Unspecified asthma, uncomplicated: Secondary | ICD-10-CM | POA: Diagnosis not present

## 2014-08-15 DIAGNOSIS — M5489 Other dorsalgia: Secondary | ICD-10-CM | POA: Insufficient documentation

## 2014-08-15 DIAGNOSIS — M79672 Pain in left foot: Secondary | ICD-10-CM | POA: Diagnosis not present

## 2014-08-15 DIAGNOSIS — R0781 Pleurodynia: Secondary | ICD-10-CM | POA: Diagnosis not present

## 2014-08-15 DIAGNOSIS — R079 Chest pain, unspecified: Secondary | ICD-10-CM | POA: Insufficient documentation

## 2014-08-15 DIAGNOSIS — T148 Other injury of unspecified body region: Secondary | ICD-10-CM | POA: Diagnosis not present

## 2014-08-15 DIAGNOSIS — S99922A Unspecified injury of left foot, initial encounter: Secondary | ICD-10-CM | POA: Diagnosis not present

## 2014-08-15 DIAGNOSIS — T07XXXA Unspecified multiple injuries, initial encounter: Secondary | ICD-10-CM | POA: Insufficient documentation

## 2014-08-15 DIAGNOSIS — S3993XA Unspecified injury of pelvis, initial encounter: Secondary | ICD-10-CM | POA: Diagnosis not present

## 2014-08-15 LAB — URINE CULTURE: Colony Count: 40000

## 2014-08-15 MED ORDER — BENZONATATE 100 MG PO CAPS: 100.0000 mg | ORAL_CAPSULE | Freq: Three times a day (TID) | ORAL | Status: DC | PRN

## 2014-08-15 MED ORDER — POTASSIUM CHLORIDE CRYS ER 20 MEQ PO TBCR
20.0000 meq | EXTENDED_RELEASE_TABLET | Freq: Every day | ORAL | Status: AC
Start: 2014-08-15 — End: ?

## 2014-08-15 MED ORDER — DOXYCYCLINE HYCLATE 100 MG PO TABS: 100.0000 mg | ORAL_TABLET | Freq: Two times a day (BID) | ORAL | Status: DC

## 2014-08-15 MED ORDER — PROPRANOLOL HCL 20 MG PO TABS: 20.0000 mg | ORAL_TABLET | Freq: Two times a day (BID) | ORAL | Status: DC

## 2014-08-15 MED ORDER — HYDROCODONE-ACETAMINOPHEN 5-325 MG PO TABS: 1.0000 | ORAL_TABLET | Freq: Four times a day (QID) | ORAL | Status: DC | PRN

## 2014-08-15 NOTE — Assessment & Plan Note (Addendum)
Recent cough last 3 days and getting productive. So will get cxr. Possible bronchitis verses early pneumonia. Pt not breathing deep due to pain of ribs.  Rx doxycycline antibiotic and benzonatate for cough.   Pt has only gi reaction to doxy in past. They think took on empty stomach. She has multiple allergeis or potential severe med interaction to other antibiotic such as biaxin and azithromycin. So had to educate and give advsiement regarding doxy. If try and side effects then stop and will need to consider other antibiotic.

## 2014-08-15 NOTE — Patient Instructions (Addendum)
Multiple contusions From fall. Pain in rt lt ankle, rt illiac crest area and mid tspine area. These areas were not xrayed in ED.  Also severe pain in various areas. Rt ribs mostly.  Rx hydrocdone. Stop percocet since making her loopy/confused. Stop tramadol.    Cough Recent cough last 3 days and getting productive. So will get cxr. Possible bronchitis verses early pneumonia. Pt not breathing deep due to pain of ribs.  Rx azithromycin antibiotic and benzonatate for cough.   Wheezing Hx of copd and on advair and albuterol. Unfortuntatley she has allergy to oral prednisone. Continue 02 at home and watch closely.    Follow up 7 days or as needed  Son cell Louie Casa [phone]. Celesta Gentile- Amy [phone].  45 minute or more spent with pt.
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
## 2014-08-15 NOTE — Assessment & Plan Note (Signed)
Hx of copd and on advair and albuterol. Unfortuntatley she has allergy to oral prednisone. Continue 02 at home and watch closely.

## 2014-08-15 NOTE — Progress Notes (Signed)
Pre visit review using our clinic review tool, if applicable. No additional management support is needed unless otherwise documented below in the visit note. 

## 2014-08-15 NOTE — Assessment & Plan Note (Signed)
From fall. Pain in rt lt ankle, rt illiac crest area and mid tspine area. These areas were not xrayed in ED.  Also severe pain in various areas. Rt ribs mostly.  Rx hydrocdone. Stop percocet since making her loopy/confused. Stop tramadol.

## 2014-08-16 ENCOUNTER — Telehealth (HOSPITAL_BASED_OUTPATIENT_CLINIC_OR_DEPARTMENT_OTHER): Payer: Self-pay | Admitting: Emergency Medicine

## 2014-08-16 NOTE — Telephone Encounter (Signed)
Post ED Visit - Positive Culture Follow-up  Culture report reviewed by antimicrobial stewardship pharmacist: []  [place], Pharm.D., BCPS []  Heide Guile, Pharm.D., BCPS []  Alycia Rossetti, Pharm.D., BCPS [x]  [place], Pharm.D., BCPS, AAHIVP []  Legrand Como, Pharm.D., BCPS, AAHIVP []  Isac Sarna, Pharm.D., BCPS  Positive urine culture Klebsiella Treated with none,, asymptomatic and no further patient follow-up is required at this time.  Hazle Nordmann 08/16/2014, 9:41 AM

## 2014-08-17 DIAGNOSIS — R404 Transient alteration of awareness: Secondary | ICD-10-CM | POA: Diagnosis not present

## 2014-08-17 DIAGNOSIS — R531 Weakness: Secondary | ICD-10-CM | POA: Diagnosis not present

## 2014-08-20 NOTE — Telephone Encounter (Signed)
Note to referral staff

## 2014-08-21 ENCOUNTER — Telehealth: Payer: Self-pay | Admitting: Family Medicine

## 2014-08-21 NOTE — Telephone Encounter (Signed)
Referral faxed to [facility] awaiting to hear back from them

## 2014-08-21 NOTE — Telephone Encounter (Signed)
Will try thn due to insurance restriction with home health.

## 2014-08-21 NOTE — Telephone Encounter (Signed)
There is no home health agency in the local area that will take pts insurance, what would you recommend the next step so I can let the son know when I call him. Please advise

## 2014-08-21 NOTE — Telephone Encounter (Signed)
Caller name:Randy Glennon Hamilton Relation to pt: son Call back number: [phone]   Reason for call:  Son is requesting orders for mother due to the fact her gait is off and requesting a home nurse, due to pt falling all the time.

## 2014-08-21 NOTE — Telephone Encounter (Signed)
Spoke with pt's son. He states pt's legs seem weaker over the last 2 weeks. Seems to walk ok but has difficulty getting up from sitting positions. Had another fall Saturday trying to get off the toilet. Continues to have difficulty breathing from previous fall but states is not worse just doesn't seem better. Notes increase in pt's confusion. Denies dysuria or urinary frequency. Reports that pt has been losing her bowels before she can make it to the bathroom as well. Not sure if due to pt's difficulty rising from seated position or if pt can't tell that she needs to go until it's too late. Scheduled pt appt with Dr Birdie Riddle for tomorrow at [place] and asked pt's son to arrive at 12:45pm and he voices understanding.

## 2014-08-21 NOTE — Telephone Encounter (Signed)
Please call pt or pt's son to determine if pt needs to go to ER for evaluation.  Increased confusion and incontinence is concerning and if something is going on, I don't want them waiting until 1 pm.

## 2014-08-22 ENCOUNTER — Ambulatory Visit: Payer: Medicare Other | Admitting: Family Medicine

## 2014-08-22 DIAGNOSIS — Z0289 Encounter for other administrative examinations: Secondary | ICD-10-CM

## 2014-08-22 NOTE — Telephone Encounter (Signed)
Spoke to patient's son who stated she has been confused, talking to her deceased mother and calling him by the wrong name.  She was incontinent of both 2-3 times yesterday.  He reports the patient has been saying she needs to go to a nursing home since it is too much for her son/daughter.  She is unable to stand without assistance and son states she has been complaining of having trouble breathing, even while wearing oxygen.  Notified son that it was best to take her to ED and not wait until appointment.  Son states that she lives far away so he will try to get there soon, but please do not cancel the appointment.

## 2014-08-22 NOTE — Telephone Encounter (Signed)
Patient's son called stating that as they were driving to the appointment, she vomited in the car.  Reiterated to Louie Casa that it was best for them to go to ED, especially in light of new vomiting.  Son stated understanding and agreed.

## 2014-08-22 NOTE — Telephone Encounter (Signed)
Called and spoke to patient to follow-up.  Patient did not provide additional information, but stated she is sleepy from pain medicine.  Called patient's son, Louie Casa and left message for him to return call when available.

## 2014-08-22 NOTE — Telephone Encounter (Signed)
Called Louie Casa to notify him that I spoke with Dr. Birdie Riddle and she was very concerned and believes patient needs to go to the ED.  Louie Casa stated understanding and said he would try his best.

## 2014-08-26 ENCOUNTER — Other Ambulatory Visit: Payer: Self-pay | Admitting: Medical

## 2014-08-26 ENCOUNTER — Other Ambulatory Visit: Payer: Self-pay | Admitting: Family Medicine

## 2014-08-26 DIAGNOSIS — J449 Chronic obstructive pulmonary disease, unspecified: Secondary | ICD-10-CM | POA: Diagnosis not present

## 2014-08-27 NOTE — Telephone Encounter (Signed)
Rx printed, awaiting signature by Dr. Charlett Blake.

## 2014-08-27 NOTE — Telephone Encounter (Signed)
Pt is requesting refill on Alprazolam.  Last OV: 07/11/2014 Last Fill: 05/20/2014 # [id number] 2RF UDS: None  Please advise.

## 2014-08-27 NOTE — Telephone Encounter (Signed)
F[facility]ed to [facility].

## 2014-08-27 NOTE — Telephone Encounter (Signed)
OK to refill, please print

## 2014-08-28 ENCOUNTER — Telehealth: Payer: Self-pay | Admitting: *Deleted

## 2014-08-28 ENCOUNTER — Telehealth: Payer: Self-pay | Admitting: Family Medicine

## 2014-08-28 ENCOUNTER — Encounter (HOSPITAL_COMMUNITY): Payer: Self-pay | Admitting: Emergency Medicine

## 2014-08-28 ENCOUNTER — Emergency Department (HOSPITAL_COMMUNITY)
Admission: EM | Admit: 2014-08-28 | Discharge: 2014-08-28 | Disposition: A | Discharge status: Home / Self Care | Payer: Medicare Other | Attending: Emergency Medicine | Admitting: Emergency Medicine

## 2014-08-28 DIAGNOSIS — Z8781 Personal history of (healed) traumatic fracture: Secondary | ICD-10-CM | POA: Diagnosis not present

## 2014-08-28 DIAGNOSIS — Z87891 Personal history of nicotine dependence: Secondary | ICD-10-CM | POA: Diagnosis not present

## 2014-08-28 DIAGNOSIS — Z8739 Personal history of other diseases of the musculoskeletal system and connective tissue: Secondary | ICD-10-CM | POA: Insufficient documentation

## 2014-08-28 DIAGNOSIS — Z79899 Other long term (current) drug therapy: Secondary | ICD-10-CM | POA: Insufficient documentation

## 2014-08-28 DIAGNOSIS — R197 Diarrhea, unspecified: Secondary | ICD-10-CM | POA: Insufficient documentation

## 2014-08-28 DIAGNOSIS — E669 Obesity, unspecified: Secondary | ICD-10-CM | POA: Insufficient documentation

## 2014-08-28 DIAGNOSIS — F419 Anxiety disorder, unspecified: Secondary | ICD-10-CM | POA: Insufficient documentation

## 2014-08-28 DIAGNOSIS — Z862 Personal history of diseases of the blood and blood-forming organs and certain disorders involving the immune mechanism: Secondary | ICD-10-CM | POA: Diagnosis not present

## 2014-08-28 DIAGNOSIS — I10 Essential (primary) hypertension: Secondary | ICD-10-CM | POA: Insufficient documentation

## 2014-08-28 DIAGNOSIS — Z880 Allergy status to penicillin: Secondary | ICD-10-CM | POA: Insufficient documentation

## 2014-08-28 DIAGNOSIS — R531 Weakness: Secondary | ICD-10-CM | POA: Diagnosis not present

## 2014-08-28 DIAGNOSIS — J449 Chronic obstructive pulmonary disease, unspecified: Secondary | ICD-10-CM | POA: Insufficient documentation

## 2014-08-28 DIAGNOSIS — F333 Major depressive disorder, recurrent, severe with psychotic symptoms: Secondary | ICD-10-CM | POA: Insufficient documentation

## 2014-08-28 DIAGNOSIS — Z9049 Acquired absence of other specified parts of digestive tract: Secondary | ICD-10-CM | POA: Insufficient documentation

## 2014-08-28 DIAGNOSIS — K219 Gastro-esophageal reflux disease without esophagitis: Secondary | ICD-10-CM | POA: Diagnosis not present

## 2014-08-28 DIAGNOSIS — R1110 Vomiting, unspecified: Secondary | ICD-10-CM

## 2014-08-28 DIAGNOSIS — R112 Nausea with vomiting, unspecified: Secondary | ICD-10-CM | POA: Diagnosis not present

## 2014-08-28 DIAGNOSIS — R030 Elevated blood-pressure reading, without diagnosis of hypertension: Secondary | ICD-10-CM | POA: Diagnosis not present

## 2014-08-28 LAB — COMPREHENSIVE METABOLIC PANEL
ALBUMIN: 3.4 g/dL — AB (ref 3.5–5.2)
ALK PHOS: 85 U/L (ref 39–117)
ALT: 11 U/L (ref 0–35)
AST: 25 U/L (ref 0–37)
Anion gap: 9 (ref 5–15)
BUN: 11 mg/dL (ref 6–23)
CHLORIDE: 102 mmol/L (ref 96–112)
CO2: 29 mmol/L (ref 19–32)
Calcium: 8.9 mg/dL (ref 8.4–10.5)
Creatinine, Ser: 0.69 mg/dL (ref 0.50–1.10)
GFR calc Af Amer: >90 mL/min (ref >90)
GFR calc non Af Amer: 82 mL/min — ABNORMAL LOW (ref >90)
Glucose, Bld: 97 mg/dL (ref 70–99)
POTASSIUM: 4 mmol/L (ref 3.5–5.1)
Sodium: 140 mmol/L (ref 135–145)
Total Bilirubin: 1.1 mg/dL (ref 0.3–1.2)
Total Protein: 6.7 g/dL (ref 6.0–8.3)

## 2014-08-28 LAB — URINALYSIS W MICROSCOPIC (NOT AT ARMC)
BILIRUBIN URINE: NEGATIVE
GLUCOSE, UA: NEGATIVE mg/dL
Hgb urine dipstick: NEGATIVE
KETONES UR: NEGATIVE mg/dL
Leukocytes, UA: NEGATIVE
Nitrite: NEGATIVE
PROTEIN: NEGATIVE mg/dL
Specific Gravity, Urine: 1.014 (ref 1.005–1.030)
UROBILINOGEN UA: 0.2 mg/dL (ref 0.0–1.0)
pH: 7.5 (ref 5.0–8.0)

## 2014-08-28 LAB — CBC
HCT: 38 % (ref 36.0–46.0)
Hemoglobin: 12 g/dL (ref 12.0–15.0)
MCH: 28.8 pg (ref 26.0–34.0)
MCHC: 31.6 g/dL (ref 30.0–36.0)
MCV: 91.3 fL (ref 78.0–100.0)
PLATELETS: 266 10*3/uL (ref 150–400)
RBC: 4.16 MIL/uL (ref 3.87–5.11)
RDW: 14 % (ref 11.5–15.5)
WBC: 5.1 10*3/uL (ref 4.0–10.5)

## 2014-08-28 MED ORDER — ONDANSETRON HCL 4 MG/2ML IJ SOLN
4.0000 mg | Freq: Once | INTRAMUSCULAR | Status: AC
  Administered 2014-08-28: 4 mg via INTRAVENOUS
  Filled 2014-08-28: qty 2

## 2014-08-28 MED ORDER — ONDANSETRON HCL 4 MG PO TABS: 4.0000 mg | ORAL_TABLET | Freq: Four times a day (QID) | ORAL | Status: DC

## 2014-08-28 MED ORDER — SODIUM CHLORIDE 0.9 % IV BOLUS (SEPSIS)
500.0000 mL | Freq: Once | INTRAVENOUS | Status: AC
  Administered 2014-08-28: 500 mL via INTRAVENOUS

## 2014-08-28 NOTE — ED Notes (Signed)
She has drank ginger ale without difficulty and without vomiting.

## 2014-08-28 NOTE — ED Notes (Signed)
Bed: [id number] Expected date:  Expected time:  Means of arrival:  Comments: EMS-N/V

## 2014-08-28 NOTE — Telephone Encounter (Signed)
Received a refill request for doxycycline 100 mg , pt aparently never picked up the Doxycycline that was sent on 08/15/14 . Pt still feels sick. Also on patients allergy list has doxycycline (vomit).

## 2014-08-28 NOTE — ED Notes (Signed)
Randy/son [phone]

## 2014-08-28 NOTE — Telephone Encounter (Signed)
Yes- charge.  We called pt/family at least 3 times that day to stress that she needed to be seen in either our office or ER, they never went.

## 2014-08-28 NOTE — ED Notes (Signed)
Per EMS-vomiting for 3 days-was here last week for a fall

## 2014-08-28 NOTE — Telephone Encounter (Signed)
Pt was no show for acute visit on 08/22/14- she was admitted to hospital today per notes - charge?
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
## 2014-08-28 NOTE — Discharge Instructions (Signed)
Return to the ED with any concerns including vomiting and not able to keep down liquids, abdominal pain, fever/chills, fainting, decreased level of alertness/lethargy, or any other alarming symptoms °

## 2014-08-30 ENCOUNTER — Other Ambulatory Visit: Payer: Self-pay | Admitting: Family Medicine

## 2014-08-30 ENCOUNTER — Other Ambulatory Visit: Payer: Self-pay | Admitting: Medical

## 2014-08-30 NOTE — Telephone Encounter (Signed)
pls contact pt re: edward's note. Thanks.

## 2014-09-03 ENCOUNTER — Other Ambulatory Visit: Payer: Self-pay | Admitting: Family Medicine

## 2014-09-03 NOTE — Telephone Encounter (Signed)
I have approved refill.

## 2014-09-05 MED ORDER — TRAMADOL HCL 50 MG PO TABS: 50.0000 mg | ORAL_TABLET | Freq: Two times a day (BID) | ORAL | Status: DC

## 2014-09-05 NOTE — Telephone Encounter (Signed)
Faxed hardcopy to [facility]. 

## 2014-09-05 NOTE — Telephone Encounter (Signed)
For Tramadol 50 mg #30 with 0 refills.

## 2014-09-05 NOTE — Addendum Note (Signed)
Addended by: Sharon Seller B on: 09/05/2014 07:20 AM   Modules accepted: Orders

## 2014-09-05 NOTE — Telephone Encounter (Signed)
Printed and put on counter for signature

## 2014-09-09 ENCOUNTER — Ambulatory Visit (INDEPENDENT_AMBULATORY_CARE_PROVIDER_SITE_OTHER): Payer: Medicare Other | Admitting: Family Medicine

## 2014-09-09 DIAGNOSIS — K219 Gastro-esophageal reflux disease without esophagitis: Secondary | ICD-10-CM | POA: Diagnosis not present

## 2014-09-09 DIAGNOSIS — R1310 Dysphagia, unspecified: Secondary | ICD-10-CM | POA: Diagnosis not present

## 2014-09-09 DIAGNOSIS — M5417 Radiculopathy, lumbosacral region: Secondary | ICD-10-CM

## 2014-09-09 DIAGNOSIS — K589 Irritable bowel syndrome without diarrhea: Secondary | ICD-10-CM | POA: Diagnosis not present

## 2014-09-09 DIAGNOSIS — M5416 Radiculopathy, lumbar region: Secondary | ICD-10-CM

## 2014-09-09 DIAGNOSIS — I10 Essential (primary) hypertension: Secondary | ICD-10-CM

## 2014-09-09 DIAGNOSIS — J45901 Unspecified asthma with (acute) exacerbation: Secondary | ICD-10-CM

## 2014-09-09 DIAGNOSIS — E669 Obesity, unspecified: Secondary | ICD-10-CM

## 2014-09-09 MED ORDER — TRAMADOL HCL 50 MG PO TABS: 50.0000 mg | ORAL_TABLET | Freq: Two times a day (BID) | ORAL | Status: DC | PRN

## 2014-09-09 NOTE — Patient Instructions (Addendum)
Fiber gummies twice daily  Probioitics daily such as [employer]estive [employer]vantage or [place]    Osteoarthritis Osteoarthritis is a disease that causes soreness and inflammation of a joint. It occurs when the cartilage at the affected joint wears down. Cartilage acts as a cushion, covering the ends of bones where they meet to form a joint. Osteoarthritis is the most common form of arthritis. It often occurs in older people. The joints affected most often by this condition include those in the:  Ends of the fingers.  Thumbs.  Neck.  Lower back.  Knees.  Hips. CAUSES  Over time, the cartilage that covers the ends of bones begins to wear away. This causes bone to rub on bone, producing pain and stiffness in the affected joints.  RISK FACTORS Certain factors can increase your chances of having osteoarthritis, including:  Older age.  Excessive body weight.  Overuse of joints.  Previous joint injury. SIGNS AND SYMPTOMS   Pain, swelling, and stiffness in the joint.  Over time, the joint may lose its normal shape.  Small deposits of bone (osteophytes) may grow on the edges of the joint.  Bits of bone or cartilage can break off and float inside the joint space. This may cause more pain and damage. DIAGNOSIS  Your health care provider will do a physical exam and ask about your symptoms. Various tests may be ordered, such as:  X-rays of the affected joint.  An MRI scan.  Blood tests to rule out other types of arthritis.  Joint fluid tests. This involves using a needle to draw fluid from the joint and examining the fluid under a microscope. TREATMENT  Goals of treatment are to control pain and improve joint function. Treatment plans may include:  A prescribed exercise program that allows for rest and joint relief.  A weight control plan.  Pain relief techniques, such as:  Properly applied heat and cold.  Electric pulses delivered to nerve endings under the skin  (transcutaneous electrical nerve stimulation [TENS]).  Massage.  Certain nutritional supplements.  Medicines to control pain, such as:  Acetaminophen.  Nonsteroidal anti-inflammatory drugs (NSAIDs), such as naproxen.  Narcotic or central-acting agents, such as tramadol.  Corticosteroids. These can be given orally or as an injection.  Surgery to reposition the bones and relieve pain (osteotomy) or to remove loose pieces of bone and cartilage. Joint replacement may be needed in advanced states of osteoarthritis. HOME CARE INSTRUCTIONS   Take medicines only as directed by your health care provider.  Maintain a healthy weight. Follow your health care provider's instructions for weight control. This may include dietary instructions.  Exercise as directed. Your health care provider can recommend specific types of exercise. These may include:  Strengthening exercises. These are done to strengthen the muscles that support joints affected by arthritis. They can be performed with weights or with exercise bands to add resistance.  Aerobic activities. These are exercises, such as brisk walking or low-impact aerobics, that get your heart pumping.  Range-of-motion activities. These keep your joints limber.  Balance and agility exercises. These help you maintain daily living skills.  Rest your affected joints as directed by your health care provider.  Keep all follow-up visits as directed by your health care provider. SEEK MEDICAL CARE IF:   Your skin turns red.  You develop a rash in addition to your joint pain.  You have worsening joint pain.  You have a fever along with joint or muscle aches. [place]  IF:  You have a significant loss of weight or appetite.  You have night sweats. [place] of Arthritis and Musculoskeletal and Skin Diseases: www.niams.SouthExposed.es  [employer] on Aging: http://kim-miller.com/  American College  of Rheumatology: www.rheumatology.org Document Released: 05/10/2005 Document Revised: 09/24/2013 Document Reviewed: 01/15/2013 [facility] Patient Information 2015 [place], [place]. This information is not intended to replace advice given to you by your health care provider. Make sure you discuss any questions you have with your health care provider.

## 2014-09-09 NOTE — Progress Notes (Signed)
Pre visit review using our clinic review tool, if applicable. No additional management support is needed unless otherwise documented below in the visit note. 

## 2014-09-10 ENCOUNTER — Other Ambulatory Visit: Payer: Self-pay | Admitting: Family Medicine

## 2014-09-10 ENCOUNTER — Other Ambulatory Visit: Payer: Self-pay | Admitting: Medical

## 2014-09-11 ENCOUNTER — Other Ambulatory Visit: Payer: Self-pay | Admitting: Family Medicine

## 2014-09-11 NOTE — Telephone Encounter (Signed)
Not filling rx.And tried to get off med/order section.

## 2014-09-12 NOTE — Telephone Encounter (Signed)
Called patient 2x no answer.

## 2014-09-14 DIAGNOSIS — J449 Chronic obstructive pulmonary disease, unspecified: Secondary | ICD-10-CM | POA: Diagnosis not present

## 2014-09-15 NOTE — Assessment & Plan Note (Signed)
Her symptoms have improved. No changes today

## 2014-09-15 NOTE — Assessment & Plan Note (Signed)
Avoid offending foods, start probiotics. Do not eat large meals in late evening and consider raising head of bed. Doing well on Protonix
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
## 2014-09-15 NOTE — Assessment & Plan Note (Signed)
Well controlled, no changes to meds. Encouraged heart healthy diet such as the DASH diet and exercise as tolerated.  °

## 2014-09-15 NOTE — Assessment & Plan Note (Signed)
Encouraged DASH diet, decrease po intake and increase exercise as tolerated. Needs 7-8 hours of sleep nightly. Avoid trans fats, eat small, frequent meals every 4-5 hours with lean proteins, complex carbs and healthy fats. Minimize simple carbs 

## 2014-09-15 NOTE — Assessment & Plan Note (Signed)
Improved with addition of fiber gummies and increased fluids.

## 2014-09-15 NOTE — Assessment & Plan Note (Signed)
Struggles with daily pain. Encouraged moist heat and gentle stretching as tolerated. May try NSAIDs and prescription meds as directed and report if symptoms worsen or seek immediate care. Allowed refill on Tramadol

## 2014-09-16 ENCOUNTER — Other Ambulatory Visit: Payer: Self-pay | Admitting: Family Medicine

## 2014-09-17 ENCOUNTER — Encounter (INDEPENDENT_AMBULATORY_CARE_PROVIDER_SITE_OTHER): Payer: Self-pay | Admitting: *Deleted

## 2014-09-18 ENCOUNTER — Other Ambulatory Visit: Payer: Self-pay | Admitting: *Deleted

## 2014-09-18 NOTE — Patient Outreach (Signed)
[name]) Care Management  09/18/2014  [name] 1934/08/21 [id number]  MD referral:  Telephone outreach call to patient. Patient advised of reason for call and of [facility] care management services. Patient voices that she had recent hospitalization  in Feb. 2016 with asthma flair up but no difficulty now. States she had several weeks of home health services that have been completed.   States she gets medications from local pharmacy without problems. Grandson prepares her medications and she takes as directed by doctor. [name] takes her to all of doctors' appointments.   Patient is aware that she is at risk for falls and is aware of ways to try to prevent.   Currently she states she has no major health concerns and states she does not feel she needs case management or educational information on any of her medical issues. She has declined [facility] care management services.   Plan: will close case.  MD closure letter sent.  Sherrin Daisy, RN BSN [place] Management Coordinator [facility] Care Management  [phone]

## 2014-09-25 DIAGNOSIS — J449 Chronic obstructive pulmonary disease, unspecified: Secondary | ICD-10-CM | POA: Diagnosis not present

## 2014-09-26 ENCOUNTER — Encounter: Payer: Self-pay | Admitting: *Deleted

## 2014-09-30 NOTE — Patient Outreach (Signed)
[facility]) Care Management  09/30/2014  [name] May 03, 1935 [id number]   Received notification from Sherrin Daisy, RN to close case due to patient declined services with [place] Management.  Ronnell Freshwater. [place] CM Assistant Phone: [phone] Fax: [phone]

## 2014-10-01 ENCOUNTER — Ambulatory Visit (INDEPENDENT_AMBULATORY_CARE_PROVIDER_SITE_OTHER): Payer: Medicare Other | Admitting: Internal Medicine

## 2014-10-01 ENCOUNTER — Encounter (INDEPENDENT_AMBULATORY_CARE_PROVIDER_SITE_OTHER): Payer: Self-pay | Admitting: *Deleted

## 2014-10-01 ENCOUNTER — Encounter (INDEPENDENT_AMBULATORY_CARE_PROVIDER_SITE_OTHER): Payer: Self-pay | Admitting: Internal Medicine

## 2014-10-01 VITALS — BP 118/74 | HR 84 | Temp 97.6°F | Ht 63.0 in | Wt 172.7 lb

## 2014-10-01 DIAGNOSIS — R1314 Dysphagia, pharyngoesophageal phase: Secondary | ICD-10-CM

## 2014-10-01 NOTE — Patient Instructions (Signed)
DG esophagram.   
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
## 2014-10-02 ENCOUNTER — Other Ambulatory Visit: Payer: Self-pay | Admitting: Family Medicine

## 2014-10-02 NOTE — Telephone Encounter (Signed)
I signed rx so she can p/u on 10/04/14

## 2014-10-02 NOTE — Telephone Encounter (Signed)
Attempted both number no answering services available. Pt recently had 60 pills 09/09/14. Pt was rx'd for 2 pills daily. It is too soon to refill meds. Pleae assist   Lumbar back pain with radiculopathy affecting left lower extremity - Mosie Lukes, MD at 09/15/2014 6:28 PM     Status: Written Related Problem: Lumbar back pain with radiculopathy affecting left lower extremity   Expand All Collapse All   Struggles with daily pain. Encouraged moist heat and gentle stretching as tolerated. May try NSAIDs and prescription meds as directed and report if symptoms worsen or seek immediate care. Allowed refill on Tramadol

## 2014-10-03 NOTE — Telephone Encounter (Signed)
Faxed hardcopy for Tramadol #[phone] with 0 refills to [facility]

## 2014-10-03 NOTE — Telephone Encounter (Signed)
Attempted to call the patient to inform but numbers no answer and no voice mail to leave a msg.

## 2014-10-04 ENCOUNTER — Ambulatory Visit (HOSPITAL_COMMUNITY)
Admission: RE | Admit: 2014-10-04 | Discharge: 2014-10-04 | Disposition: A | Discharge status: Home / Self Care | Payer: Medicare Other | Source: Ambulatory Visit | Attending: Internal Medicine | Admitting: Internal Medicine

## 2014-10-04 DIAGNOSIS — J449 Chronic obstructive pulmonary disease, unspecified: Secondary | ICD-10-CM | POA: Insufficient documentation

## 2014-10-04 DIAGNOSIS — J45909 Unspecified asthma, uncomplicated: Secondary | ICD-10-CM | POA: Insufficient documentation

## 2014-10-04 DIAGNOSIS — I10 Essential (primary) hypertension: Secondary | ICD-10-CM | POA: Insufficient documentation

## 2014-10-04 DIAGNOSIS — K219 Gastro-esophageal reflux disease without esophagitis: Secondary | ICD-10-CM | POA: Insufficient documentation

## 2014-10-04 DIAGNOSIS — R1314 Dysphagia, pharyngoesophageal phase: Secondary | ICD-10-CM | POA: Diagnosis not present

## 2014-10-11 ENCOUNTER — Telehealth (INDEPENDENT_AMBULATORY_CARE_PROVIDER_SITE_OTHER): Payer: Self-pay | Admitting: Internal Medicine

## 2014-10-11 NOTE — Telephone Encounter (Signed)
error 

## 2014-10-14 DIAGNOSIS — J449 Chronic obstructive pulmonary disease, unspecified: Secondary | ICD-10-CM | POA: Diagnosis not present

## 2014-10-15 ENCOUNTER — Telehealth (INDEPENDENT_AMBULATORY_CARE_PROVIDER_SITE_OTHER): Payer: Self-pay | Admitting: Internal Medicine

## 2014-10-15 NOTE — Telephone Encounter (Signed)
error 

## 2014-10-17 ENCOUNTER — Encounter: Payer: 59 | Admitting: Family Medicine

## 2014-10-20 ENCOUNTER — Other Ambulatory Visit: Payer: Self-pay | Admitting: Family Medicine

## 2014-10-20 NOTE — Telephone Encounter (Signed)
Ok to refill the Tramadol, same sig, same number

## 2014-10-22 NOTE — Telephone Encounter (Signed)
Faxed hardcopy for Tramadol to [facility] [place]

## 2014-10-26 DIAGNOSIS — J449 Chronic obstructive pulmonary disease, unspecified: Secondary | ICD-10-CM | POA: Diagnosis not present

## 2014-10-28 ENCOUNTER — Other Ambulatory Visit: Payer: Self-pay | Admitting: Medical

## 2014-11-04 ENCOUNTER — Ambulatory Visit (HOSPITAL_BASED_OUTPATIENT_CLINIC_OR_DEPARTMENT_OTHER)
Admission: RE | Admit: 2014-11-04 | Discharge: 2014-11-04 | Disposition: A | Discharge status: Home / Self Care | Payer: Medicare Other | Source: Ambulatory Visit | Attending: Medical | Admitting: Medical

## 2014-11-04 ENCOUNTER — Encounter: Payer: Self-pay | Admitting: Medical

## 2014-11-04 ENCOUNTER — Ambulatory Visit (INDEPENDENT_AMBULATORY_CARE_PROVIDER_SITE_OTHER): Payer: Medicare Other | Admitting: Medical

## 2014-11-04 VITALS — BP 122/80 | HR 93 | Temp 98.6°F | Ht 63.0 in | Wt 170.8 lb

## 2014-11-04 DIAGNOSIS — J209 Acute bronchitis, unspecified: Secondary | ICD-10-CM

## 2014-11-04 DIAGNOSIS — R062 Wheezing: Secondary | ICD-10-CM

## 2014-11-04 DIAGNOSIS — R918 Other nonspecific abnormal finding of lung field: Secondary | ICD-10-CM | POA: Diagnosis not present

## 2014-11-04 DIAGNOSIS — R05 Cough: Secondary | ICD-10-CM | POA: Diagnosis not present

## 2014-11-04 MED ORDER — BENZONATATE 100 MG PO CAPS: 100.0000 mg | ORAL_CAPSULE | Freq: Three times a day (TID) | ORAL | Status: DC | PRN

## 2014-11-04 MED ORDER — SULFAMETHOXAZOLE-TRIMETHOPRIM 800-160 MG PO TABS: 1.0000 | ORAL_TABLET | Freq: Two times a day (BID) | ORAL | Status: DC

## 2014-11-04 MED ORDER — IPRATROPIUM-ALBUTEROL 0.5-2.5 (3) MG/3ML IN SOLN: 3.0000 mL | RESPIRATORY_TRACT | Status: AC
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
## 2014-11-04 NOTE — Patient Instructions (Addendum)
Wheezing Advised pt to use o2 at home. Neb tx every 6-8 hours and advair. Cxr today. If breathing worsens then ED evaluation.   With your chronic o2 use and not being on oxygen now, I do think this played role in low 02 sat readings in office.  Acute bronchitis Bronchtitis with  sinusitis. Rx bactrim. Benzonatate for cough. Get cxr today.    Could not give prednisone today due to pt allergy to this.   Follow up in 1 wk or as needed

## 2014-11-04 NOTE — Progress Notes (Signed)
Pre visit review using our clinic review tool, if applicable. No additional management support is needed unless otherwise documented below in the visit note. 

## 2014-11-04 NOTE — Assessment & Plan Note (Addendum)
Bronchtitis with  sinusitis. Rx bactrim. Benzonatate for cough. Get cxr today.

## 2014-11-04 NOTE — Assessment & Plan Note (Signed)
Advised pt to use o2 at home. Neb tx every 6-8 hours and advair. Cxr today. If breathing worsens then ED evaluation.   With your chronic o2 use and not being on oxygen now, I do think this played role in low 02 sat readings in office.

## 2014-11-05 DIAGNOSIS — T1490 Injury, unspecified: Secondary | ICD-10-CM | POA: Diagnosis not present

## 2014-11-08 DIAGNOSIS — B370 Candidal stomatitis: Secondary | ICD-10-CM | POA: Diagnosis not present

## 2014-11-11 ENCOUNTER — Other Ambulatory Visit: Payer: Self-pay | Admitting: Family Medicine

## 2014-11-11 NOTE — Telephone Encounter (Signed)
Requesting: alprazolam Contract   07/12/14 UDS  07/12/14 low risk OV 09/06/14 Last Refill   08/27/14 #90  2 refills

## 2014-11-11 NOTE — Telephone Encounter (Signed)
Not due til early July

## 2014-11-14 DIAGNOSIS — J449 Chronic obstructive pulmonary disease, unspecified: Secondary | ICD-10-CM | POA: Diagnosis not present

## 2014-11-19 ENCOUNTER — Encounter (HOSPITAL_COMMUNITY): Payer: Self-pay | Admitting: *Deleted

## 2014-11-19 ENCOUNTER — Emergency Department (HOSPITAL_COMMUNITY): Payer: Medicare Other

## 2014-11-19 ENCOUNTER — Emergency Department (HOSPITAL_COMMUNITY)
Admission: EM | Admit: 2014-11-19 | Discharge: 2014-11-20 | Disposition: A | Discharge status: Home / Self Care | Payer: Medicare Other | Attending: Emergency Medicine | Admitting: Emergency Medicine

## 2014-11-19 DIAGNOSIS — F32A Depression, unspecified: Secondary | ICD-10-CM

## 2014-11-19 DIAGNOSIS — Z792 Long term (current) use of antibiotics: Secondary | ICD-10-CM | POA: Insufficient documentation

## 2014-11-19 DIAGNOSIS — D649 Anemia, unspecified: Secondary | ICD-10-CM | POA: Insufficient documentation

## 2014-11-19 DIAGNOSIS — K219 Gastro-esophageal reflux disease without esophagitis: Secondary | ICD-10-CM | POA: Insufficient documentation

## 2014-11-19 DIAGNOSIS — F0390 Unspecified dementia without behavioral disturbance: Secondary | ICD-10-CM | POA: Diagnosis not present

## 2014-11-19 DIAGNOSIS — R600 Localized edema: Secondary | ICD-10-CM | POA: Diagnosis not present

## 2014-11-19 DIAGNOSIS — Z880 Allergy status to penicillin: Secondary | ICD-10-CM | POA: Insufficient documentation

## 2014-11-19 DIAGNOSIS — Z87891 Personal history of nicotine dependence: Secondary | ICD-10-CM | POA: Insufficient documentation

## 2014-11-19 DIAGNOSIS — R45851 Suicidal ideations: Secondary | ICD-10-CM | POA: Diagnosis present

## 2014-11-19 DIAGNOSIS — I10 Essential (primary) hypertension: Secondary | ICD-10-CM | POA: Insufficient documentation

## 2014-11-19 DIAGNOSIS — X838XXA Intentional self-harm by other specified means, initial encounter: Secondary | ICD-10-CM

## 2014-11-19 DIAGNOSIS — E669 Obesity, unspecified: Secondary | ICD-10-CM | POA: Diagnosis not present

## 2014-11-19 DIAGNOSIS — F329 Major depressive disorder, single episode, unspecified: Secondary | ICD-10-CM

## 2014-11-19 DIAGNOSIS — R062 Wheezing: Secondary | ICD-10-CM | POA: Diagnosis not present

## 2014-11-19 DIAGNOSIS — J441 Chronic obstructive pulmonary disease with (acute) exacerbation: Secondary | ICD-10-CM | POA: Insufficient documentation

## 2014-11-19 DIAGNOSIS — Z8739 Personal history of other diseases of the musculoskeletal system and connective tissue: Secondary | ICD-10-CM | POA: Diagnosis not present

## 2014-11-19 DIAGNOSIS — Z79899 Other long term (current) drug therapy: Secondary | ICD-10-CM | POA: Diagnosis not present

## 2014-11-19 DIAGNOSIS — R05 Cough: Secondary | ICD-10-CM | POA: Diagnosis not present

## 2014-11-19 DIAGNOSIS — F419 Anxiety disorder, unspecified: Secondary | ICD-10-CM | POA: Diagnosis not present

## 2014-11-19 DIAGNOSIS — T1491 Suicide attempt: Secondary | ICD-10-CM | POA: Diagnosis not present

## 2014-11-19 LAB — URINALYSIS, ROUTINE W REFLEX MICROSCOPIC
Bilirubin Urine: NEGATIVE
Glucose, UA: NEGATIVE mg/dL
Hgb urine dipstick: NEGATIVE
Ketones, ur: NEGATIVE mg/dL
NITRITE: NEGATIVE
PROTEIN: NEGATIVE mg/dL
Specific Gravity, Urine: 1.003 — ABNORMAL LOW (ref 1.005–1.030)
UROBILINOGEN UA: 0.2 mg/dL (ref 0.0–1.0)
pH: 7 (ref 5.0–8.0)

## 2014-11-19 LAB — CBC WITH DIFFERENTIAL/PLATELET
Basophils Absolute: 0 10*3/uL (ref 0.0–0.1)
Basophils Relative: 1 % (ref 0–1)
EOS ABS: 0.2 10*3/uL (ref 0.0–0.7)
Eosinophils Relative: 3 % (ref 0–5)
HEMATOCRIT: 38.2 % (ref 36.0–46.0)
Hemoglobin: 12.1 g/dL (ref 12.0–15.0)
Lymphocytes Relative: 46 % (ref 12–46)
Lymphs Abs: 3.3 10*3/uL (ref 0.7–4.0)
MCH: 29.5 pg (ref 26.0–34.0)
MCHC: 31.7 g/dL (ref 30.0–36.0)
MCV: 93.2 fL (ref 78.0–100.0)
MONO ABS: 0.5 10*3/uL (ref 0.1–1.0)
MONOS PCT: 7 % (ref 3–12)
Neutro Abs: 3.1 10*3/uL (ref 1.7–7.7)
Neutrophils Relative %: 43 % (ref 43–77)
Platelets: 427 10*3/uL — ABNORMAL HIGH (ref 150–400)
RBC: 4.1 MIL/uL (ref 3.87–5.11)
RDW: 13.6 % (ref 11.5–15.5)
WBC: 7.1 10*3/uL (ref 4.0–10.5)

## 2014-11-19 LAB — BASIC METABOLIC PANEL
Anion gap: 5 (ref 5–15)
BUN: 6 mg/dL (ref 6–20)
CALCIUM: 9.1 mg/dL (ref 8.9–10.3)
CO2: 34 mmol/L — ABNORMAL HIGH (ref 22–32)
Chloride: 104 mmol/L (ref 101–111)
Creatinine, Ser: 0.84 mg/dL (ref 0.44–1.00)
GLUCOSE: 82 mg/dL (ref 65–99)
POTASSIUM: 4.2 mmol/L (ref 3.5–5.1)
SODIUM: 143 mmol/L (ref 135–145)

## 2014-11-19 LAB — SALICYLATE LEVEL

## 2014-11-19 LAB — TROPONIN I: Troponin I: <0.03 ng/mL (ref <0.031)

## 2014-11-19 LAB — BRAIN NATRIURETIC PEPTIDE: B NATRIURETIC PEPTIDE 5: 129.9 pg/mL — AB (ref 0.0–100.0)

## 2014-11-19 LAB — URINE MICROSCOPIC-ADD ON

## 2014-11-19 LAB — ACETAMINOPHEN LEVEL

## 2014-11-19 MED ORDER — ALUM & MAG HYDROXIDE-SIMETH 200-200-20 MG/5ML PO SUSP: 30.0000 mL | ORAL | Status: DC | PRN

## 2014-11-19 MED ORDER — ALPRAZOLAM 0.25 MG PO TABS
0.2500 mg | ORAL_TABLET | Freq: Three times a day (TID) | ORAL | Status: DC | PRN
  Administered 2014-11-20 (×2): 0.25 mg via ORAL
  Filled 2014-11-19 (×2): qty 1

## 2014-11-19 MED ORDER — QUETIAPINE FUMARATE 25 MG PO TABS
12.5000 mg | ORAL_TABLET | Freq: Every day | ORAL | Status: DC
  Administered 2014-11-19: 12.5 mg via ORAL
  Filled 2014-11-19: qty 1

## 2014-11-19 MED ORDER — ACETAMINOPHEN 325 MG PO TABS
650.0000 mg | ORAL_TABLET | ORAL | Status: DC | PRN
  Administered 2014-11-20: 650 mg via ORAL
  Filled 2014-11-19: qty 2

## 2014-11-19 MED ORDER — IPRATROPIUM-ALBUTEROL 0.5-2.5 (3) MG/3ML IN SOLN: 3.0000 mL | RESPIRATORY_TRACT | Status: DC

## 2014-11-19 MED ORDER — FUROSEMIDE 40 MG PO TABS
20.0000 mg | ORAL_TABLET | Freq: Once | ORAL | Status: AC
  Administered 2014-11-19: 20 mg via ORAL
  Filled 2014-11-19: qty 1

## 2014-11-19 MED ORDER — LORAZEPAM 1 MG PO TABS: 1.0000 mg | ORAL_TABLET | Freq: Three times a day (TID) | ORAL | Status: DC | PRN

## 2014-11-19 MED ORDER — FUROSEMIDE 40 MG PO TABS
20.0000 mg | ORAL_TABLET | Freq: Every day | ORAL | Status: DC
  Administered 2014-11-20: 20 mg via ORAL
  Filled 2014-11-19: qty 1

## 2014-11-19 MED ORDER — DONEPEZIL HCL 10 MG PO TABS
10.0000 mg | ORAL_TABLET | Freq: Every day | ORAL | Status: DC
  Administered 2014-11-19: 10 mg via ORAL
  Filled 2014-11-19 (×2): qty 1

## 2014-11-19 MED ORDER — PANTOPRAZOLE SODIUM 40 MG PO TBEC
40.0000 mg | DELAYED_RELEASE_TABLET | Freq: Once | ORAL | Status: AC
  Administered 2014-11-19: 40 mg via ORAL
  Filled 2014-11-19: qty 1

## 2014-11-19 MED ORDER — IPRATROPIUM-ALBUTEROL 0.5-2.5 (3) MG/3ML IN SOLN
3.0000 mL | Freq: Once | RESPIRATORY_TRACT | Status: AC
  Administered 2014-11-19: 3 mL via RESPIRATORY_TRACT
  Filled 2014-11-19: qty 3

## 2014-11-19 NOTE — ED Notes (Signed)
Pts o2 sat assessed at 88% ra, 3 liters applied, increased 94%

## 2014-11-19 NOTE — ED Notes (Signed)
Moving patient to room 7 to perform ekg- per amber.

## 2014-11-19 NOTE — ED Notes (Signed)
Patient is tearful, continues to repeat "just let me die, please"
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
## 2014-11-20 ENCOUNTER — Telehealth: Payer: Self-pay | Admitting: Family Medicine

## 2014-11-20 DIAGNOSIS — F329 Major depressive disorder, single episode, unspecified: Secondary | ICD-10-CM | POA: Diagnosis present

## 2014-11-20 DIAGNOSIS — F0390 Unspecified dementia without behavioral disturbance: Secondary | ICD-10-CM | POA: Insufficient documentation

## 2014-11-20 DIAGNOSIS — X838XXA Intentional self-harm by other specified means, initial encounter: Secondary | ICD-10-CM | POA: Insufficient documentation

## 2014-11-20 DIAGNOSIS — R45851 Suicidal ideations: Secondary | ICD-10-CM

## 2014-11-20 DIAGNOSIS — F322 Major depressive disorder, single episode, severe without psychotic features: Secondary | ICD-10-CM

## 2014-11-20 LAB — RAPID URINE DRUG SCREEN, HOSP PERFORMED
Amphetamines: NOT DETECTED
Barbiturates: NOT DETECTED
Benzodiazepines: POSITIVE — AB
Cocaine: NOT DETECTED
OPIATES: NOT DETECTED
Tetrahydrocannabinol: NOT DETECTED

## 2014-11-20 MED ORDER — FLUOXETINE HCL 10 MG PO CAPS
10.0000 mg | ORAL_CAPSULE | Freq: Every day | ORAL | Status: DC
  Administered 2014-11-20: 10 mg via ORAL
  Filled 2014-11-20: qty 1

## 2014-11-20 MED ORDER — IPRATROPIUM-ALBUTEROL 0.5-2.5 (3) MG/3ML IN SOLN: 3.0000 mL | RESPIRATORY_TRACT | Status: DC | PRN
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
## 2014-11-20 NOTE — Progress Notes (Signed)
Pt signed voluntary admission form for [place]. Patient to be transported voluntarily by [facility]. CSW awaiting accepting MD name.   Belia Heman, [place] Work  [employer] [phone]
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
## 2014-11-20 NOTE — ED Notes (Signed)
Pt belongings taken home by pt's son per pt request. Pt's walker transported to TCU bed 26 with pt.

## 2014-11-20 NOTE — Progress Notes (Signed)
Pt accepted to [facility] per Nunzio Cory. At this time waiting for bed to be ready.   Belia Heman, [place] Work  [employer] [phone]

## 2014-11-20 NOTE — Telephone Encounter (Signed)
Caller name: Cristela Blue Relation to pt: son  Call back number: [phone]    Reason for call:  Son wanted to inform you pt was admitted to [facility] due to her trying to commit suicide. Pt is currently admitted to [facility]
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
## 2014-11-20 NOTE — Progress Notes (Signed)
CSW spoke with Nunzio Cory of [facility] who confirms that the pt has been accepted.  CSW made nurse aware.  Accepting Physician: Odis Luster Nurse Report Number: [phone]

## 2014-11-20 NOTE — BH Assessment (Signed)
Seeking inpt gero placement. Sent referrals to [facility], [facility], [facility], [facility], [facility], and [facility].   Lear Ng, [facility] Triage Specialist 11/20/2014 1:47 AM

## 2014-11-20 NOTE — ED Notes (Signed)
Pt anxious in room, upset about grandson, pt given xanax, pt also having difficulty w/ gait, steady used to take pt to restroom.

## 2014-11-20 NOTE — Progress Notes (Signed)
CSW consulted with nurse. [facility] is present to transport patient. They are waiting for her to finish eating.  Willette Brace [phone] ED CSW 11/20/2014 6:57 PM

## 2014-11-20 NOTE — ED Notes (Signed)
Tele-psych in progress at bedside.

## 2014-11-21 DIAGNOSIS — I10 Essential (primary) hypertension: Secondary | ICD-10-CM | POA: Diagnosis not present

## 2014-11-21 DIAGNOSIS — Z9181 History of falling: Secondary | ICD-10-CM | POA: Diagnosis not present

## 2014-11-21 DIAGNOSIS — J439 Emphysema, unspecified: Secondary | ICD-10-CM | POA: Diagnosis not present

## 2014-11-21 DIAGNOSIS — J9612 Chronic respiratory failure with hypercapnia: Secondary | ICD-10-CM | POA: Diagnosis not present

## 2014-11-23 DIAGNOSIS — J9612 Chronic respiratory failure with hypercapnia: Secondary | ICD-10-CM | POA: Diagnosis not present

## 2014-11-23 DIAGNOSIS — J439 Emphysema, unspecified: Secondary | ICD-10-CM | POA: Diagnosis not present

## 2014-11-23 DIAGNOSIS — I10 Essential (primary) hypertension: Secondary | ICD-10-CM | POA: Diagnosis not present

## 2014-11-23 DIAGNOSIS — Z9181 History of falling: Secondary | ICD-10-CM | POA: Diagnosis not present

## 2014-11-24 DIAGNOSIS — J439 Emphysema, unspecified: Secondary | ICD-10-CM | POA: Diagnosis not present

## 2014-11-24 DIAGNOSIS — J9612 Chronic respiratory failure with hypercapnia: Secondary | ICD-10-CM | POA: Diagnosis not present

## 2014-11-24 DIAGNOSIS — Z9181 History of falling: Secondary | ICD-10-CM | POA: Diagnosis not present

## 2014-11-24 DIAGNOSIS — I10 Essential (primary) hypertension: Secondary | ICD-10-CM | POA: Diagnosis not present

## 2014-11-25 DIAGNOSIS — R112 Nausea with vomiting, unspecified: Secondary | ICD-10-CM | POA: Diagnosis not present

## 2014-11-25 DIAGNOSIS — K5901 Slow transit constipation: Secondary | ICD-10-CM | POA: Diagnosis not present

## 2014-11-25 DIAGNOSIS — Z9181 History of falling: Secondary | ICD-10-CM | POA: Diagnosis not present

## 2014-11-25 DIAGNOSIS — J449 Chronic obstructive pulmonary disease, unspecified: Secondary | ICD-10-CM | POA: Diagnosis not present

## 2014-11-25 DIAGNOSIS — J9612 Chronic respiratory failure with hypercapnia: Secondary | ICD-10-CM | POA: Diagnosis not present

## 2014-11-25 DIAGNOSIS — G43A0 Cyclical vomiting, not intractable: Secondary | ICD-10-CM | POA: Diagnosis not present

## 2014-11-25 DIAGNOSIS — I10 Essential (primary) hypertension: Secondary | ICD-10-CM | POA: Diagnosis not present

## 2014-11-26 DIAGNOSIS — F332 Major depressive disorder, recurrent severe without psychotic features: Secondary | ICD-10-CM | POA: Diagnosis not present

## 2014-11-26 DIAGNOSIS — I10 Essential (primary) hypertension: Secondary | ICD-10-CM | POA: Diagnosis not present

## 2014-11-26 DIAGNOSIS — J9612 Chronic respiratory failure with hypercapnia: Secondary | ICD-10-CM | POA: Diagnosis not present

## 2014-11-26 DIAGNOSIS — D649 Anemia, unspecified: Secondary | ICD-10-CM | POA: Diagnosis not present

## 2014-11-28 DIAGNOSIS — D649 Anemia, unspecified: Secondary | ICD-10-CM | POA: Diagnosis not present

## 2014-11-28 DIAGNOSIS — I10 Essential (primary) hypertension: Secondary | ICD-10-CM | POA: Diagnosis not present

## 2014-11-28 DIAGNOSIS — J9612 Chronic respiratory failure with hypercapnia: Secondary | ICD-10-CM | POA: Diagnosis not present

## 2014-11-28 DIAGNOSIS — F332 Major depressive disorder, recurrent severe without psychotic features: Secondary | ICD-10-CM | POA: Diagnosis not present

## 2014-11-30 DIAGNOSIS — I10 Essential (primary) hypertension: Secondary | ICD-10-CM | POA: Diagnosis not present

## 2014-11-30 DIAGNOSIS — F332 Major depressive disorder, recurrent severe without psychotic features: Secondary | ICD-10-CM | POA: Diagnosis not present

## 2014-11-30 DIAGNOSIS — D649 Anemia, unspecified: Secondary | ICD-10-CM | POA: Diagnosis not present

## 2014-11-30 DIAGNOSIS — J9612 Chronic respiratory failure with hypercapnia: Secondary | ICD-10-CM | POA: Diagnosis not present

## 2014-12-03 ENCOUNTER — Other Ambulatory Visit: Payer: Self-pay | Admitting: Family Medicine

## 2014-12-03 NOTE — Telephone Encounter (Signed)
Requesting: Tramadol Contract None UDS  Given Low Risk Last OV   09/09/14 Last Refill   #30 with 0 refills on 10/22/14  Please Advise

## 2014-12-03 NOTE — Telephone Encounter (Signed)
OK to give refill on Tramadol, same sig, same strength same number

## 2014-12-03 NOTE — Telephone Encounter (Signed)
Caller name: randy Relation to pt: son Call back number: [phone] Pharmacy: [facility] in [place]  Reason for call:   Requesting tramadol refill for patient. Louie Casa states that patient has now moved to [facility]

## 2014-12-04 ENCOUNTER — Telehealth: Payer: Self-pay | Admitting: *Deleted

## 2014-12-04 MED ORDER — TRAMADOL HCL 50 MG PO TABS: 50.0000 mg | ORAL_TABLET | Freq: Two times a day (BID) | ORAL | Status: DC

## 2014-12-04 NOTE — Telephone Encounter (Signed)
Rx refilled and faxed ?

## 2014-12-04 NOTE — Telephone Encounter (Signed)
Unable to reach patient at time of TCM Call.  Phone rang continuously without connecting to voicemail, will retry call.

## 2014-12-09 ENCOUNTER — Ambulatory Visit (INDEPENDENT_AMBULATORY_CARE_PROVIDER_SITE_OTHER): Payer: Medicare Other | Admitting: Physician Assistant

## 2014-12-09 ENCOUNTER — Encounter: Payer: Self-pay | Admitting: Physician Assistant

## 2014-12-09 VITALS — BP 102/60 | HR 65 | Temp 98.2°F | Ht 63.0 in | Wt 166.0 lb

## 2014-12-09 DIAGNOSIS — F0280 Dementia in other diseases classified elsewhere without behavioral disturbance: Secondary | ICD-10-CM | POA: Diagnosis not present

## 2014-12-09 DIAGNOSIS — F329 Major depressive disorder, single episode, unspecified: Secondary | ICD-10-CM | POA: Diagnosis not present

## 2014-12-09 DIAGNOSIS — F0393 Unspecified dementia, unspecified severity, with mood disturbance: Secondary | ICD-10-CM

## 2014-12-09 MED ORDER — ALBUTEROL SULFATE (2.5 MG/3ML) 0.083% IN NEBU: 2.5000 mg | INHALATION_SOLUTION | Freq: Four times a day (QID) | RESPIRATORY_TRACT | Status: AC | PRN

## 2014-12-09 MED ORDER — CLONAZEPAM 0.5 MG PO TABS: 0.5000 mg | ORAL_TABLET | Freq: Every day | ORAL | Status: DC

## 2014-12-09 MED ORDER — OMEPRAZOLE 40 MG PO CPDR: 40.0000 mg | DELAYED_RELEASE_CAPSULE | Freq: Every day | ORAL | Status: AC

## 2014-12-09 MED ORDER — DONEPEZIL HCL 10 MG PO TABS: 10.0000 mg | ORAL_TABLET | Freq: Every day | ORAL | Status: AC

## 2014-12-09 NOTE — Progress Notes (Signed)
Pre visit review using our clinic review tool, if applicable. No additional management support is needed unless otherwise documented below in the visit note. 

## 2014-12-09 NOTE — Patient Instructions (Signed)
Please continue medications as directed. Start the Klonopin at bedtime for sleep.  Call to schedule appointment with Psychiatry for further management. Follow-up with Dr. Charlett Blake in 1 month.

## 2014-12-14 DIAGNOSIS — J449 Chronic obstructive pulmonary disease, unspecified: Secondary | ICD-10-CM | POA: Diagnosis not present

## 2014-12-15 DIAGNOSIS — F329 Major depressive disorder, single episode, unspecified: Principal | ICD-10-CM

## 2014-12-15 DIAGNOSIS — F0393 Unspecified dementia, unspecified severity, with mood disturbance: Secondary | ICD-10-CM | POA: Insufficient documentation

## 2014-12-15 DIAGNOSIS — F0280 Dementia in other diseases classified elsewhere without behavioral disturbance: Secondary | ICD-10-CM | POA: Insufficient documentation

## 2014-12-15 NOTE — Assessment & Plan Note (Signed)
With recent suicide gesture and subsequent hospitalization. Patient followed by psychiatry. Son encouraged to schedule follow-up appointment. He is to make our office aware if he has any difficulty scheduling follow-up within the next 2 weeks. Continue Remeron, sertraline and trazodone as directed by psychiatrist. Continue Aricept 10 mg daily. Giving nighttime agitation, fill it is okay to start Klonopin 0.5 mg nightly if needed. Discussed with PCP, Dr. Jolayne Haines who is in agreement. Patient and son are both aware that this medication regimen may be changed when a follow-up with psychiatrist. Alarm signs and symptoms reviewed with patient and son. They're to call 911 if any of these occur.

## 2014-12-15 NOTE — Progress Notes (Signed)
Patient presents to clinic today with her son for hospital follow-up. Patient was hospitalized recently for severe depression with suicidal ideation and intent. Patient was found by her nephew with a gun in her hand. Was sent to the emergency department and subsequently admitted to inpatient behavioral health treatment. Patient's medications were altered and patient was stabilized and discharged with outpatient psychiatric follow-up.  Patient states she is doing well overall. Still suffering from depressed mood. Denies suicidal ideation or plan, but does feel very depressed due to her dementia. Patient endorses frustration over not being able to take care of herself. Is currently residing with her son who is helping to manage her medications and care. Patient's current medication regimen includes Remeron 15 mg, trazodone 50 mg and Zoloft 100 mg daily. Patient endorses she still gets agitated at night. Was previously on Xanax to help with agitation, but was taken off medication during inpatient care. Patient's son endorses that he is trying to get a follow-up appointment with his mother scheduled. Had an appointment yesterday, but was missed due to son's work schedule.  Past Medical History  Diagnosis Date  . Major depressive disorder, recurrent episode, severe, specified as with psychotic behavior   . Personal history of other mental disorder   . GERD (gastroesophageal reflux disease)   . Anxiety   . Osteoarthrosis, unspecified whether generalized or localized, unspecified site   . Unspecified essential hypertension   . Obesity, unspecified   . Unspecified asthma(493.90)   . COPD (chronic obstructive pulmonary disease)   . Diverticulitis   . Nodule of esophagus   . Anemia 07/22/2014    Current Outpatient Prescriptions on File Prior to Visit  Medication Sig Dispense Refill  . ADVAIR DISKUS 100-50 MCG/DOSE AEPB INHALE 1 PUFF INTO THE LUNGS 2 (TWO) TIMES DAILY. 60 each 4  .  ipratropium-albuterol (DUONEB) 0.5-2.5 (3) MG/3ML SOLN Take 3 mLs by nebulization every 6 (six) hours as needed. (Patient taking differently: Take 3 mLs by nebulization every 6 (six) hours as needed (wheezing, shortness of breath). ) 360 mL 1  . potassium chloride SA (KLOR-CON M20) 20 MEQ tablet Take 1 tablet (20 mEq total) by mouth daily. Take once a day as needed you need to take the potassium pill every day you take the furosemide (Patient taking differently: Take 20 mEq by mouth daily. Take once a day as needed. Must take the potassium pill every day you take the furosemide) 30 tablet 2  . propranolol (INDERAL) 20 MG tablet TAKE 1 TABLET BY MOUTH TWICE A DAY 60 tablet 2  . traMADol (ULTRAM) 50 MG tablet Take 1 tablet (50 mg total) by mouth 2 (two) times daily. 30 tablet 0  . vitamin B-12 (CYANOCOBALAMIN) 1000 MCG tablet Take 1,000 mcg by mouth daily.    . Vitamin D, Ergocalciferol, (DRISDOL) 50000 UNITS CAPS capsule TAKE 1 CAPSULE (50,000 UNITS TOTAL) BY MOUTH EVERY 7 (SEVEN) DAYS. TAKES ON WEDNESDAYS. 12 capsule 0  . FIBER SELECT GUMMIES PO Take 1 tablet by mouth daily.     . furosemide (LASIX) 20 MG tablet TAKE 1 TABLET BY MOUTH EVERY DAY AND TAKE 2 TABLETS AS NEEDED FOR SWELLING (Patient not taking: Reported on 12/09/2014) 40 tablet 8  . loperamide (IMODIUM) 2 MG capsule Take by mouth as needed for diarrhea or loose stools.     Current Facility-Administered Medications on File Prior to Visit  Medication Dose Route Frequency Provider Last Rate Last Dose  . ipratropium-albuterol (DUONEB) 0.5-2.5 (3) MG/3ML nebulizer solution  3 mL  3 mL Nebulization Q4H Edward Saguier, PA-C        Allergies  Allergen Reactions  . Ciprofloxacin Nausea And Vomiting  . Diltiazem Nausea And Vomiting  . Doxycycline Other (See Comments)    Vomit   . Eggs Or Egg-Derived Products Other (See Comments)    Patient is unsure of reaction   . Levofloxacin Other (See Comments)    Patient does not recall reaction   .  Morphine     REACTION: pt states that she decreases in mental capacity when taking morphine  . Codeine Anxiety  . Penicillins Swelling    Patient unsure of symptoms  . Prednisone Rash    Ulcers in mouth     Family History  Problem Relation Age of Onset  . Heart attack Mother   . Cancer Mother     cervical   . Varicose Veins Mother   . Hypothyroidism Sister     brittle bone disease   . Leukemia Brother   . Hypertension Father     History   Social History  . Marital Status: Widowed    Spouse Name: N/A  . Number of Children: 4  . Years of Education: N/A   Occupational History  . disabled     Social History Main Topics  . Smoking status: Former Smoker    Quit date: 10/12/2003  . Smokeless tobacco: Not on file  . Alcohol Use: No  . Drug Use: No  . Sexual Activity: Not on file   Other Topics Concern  . None   Social History Narrative    Review of Systems - See HPI.  All other ROS are negative.  BP 102/60 mmHg  Pulse 65  Temp(Src) 98.2 F (36.8 C) (Oral)  Ht 5' 3"  (1.6 m)  Wt 166 lb (75.297 kg)  BMI 29.41 kg/m2  SpO2 91%  Physical Exam  Constitutional: She is well-developed, well-nourished, and in no distress.  HENT:  Head: Normocephalic and atraumatic.  Eyes: Conjunctivae are normal.  Neck: Neck supple.  Cardiovascular: Normal rate, regular rhythm, normal heart sounds and intact distal pulses.   Pulmonary/Chest: Effort normal and breath sounds normal. No respiratory distress. She has no wheezes. She has no rales. She exhibits no tenderness.  Neurological: She is alert.  Skin: Skin is warm and dry. No rash noted.  Psychiatric: She exhibits a depressed mood. She expresses no homicidal and no suicidal ideation. She expresses no suicidal plans and no homicidal plans. She exhibits abnormal remote memory. She has a flat affect.  Vitals reviewed.   Recent Results (from the past 2160 hour(s))  Basic metabolic panel     Status: Abnormal   Collection Time:  11/19/14 10:04 PM  Result Value Ref Range   Sodium 143 135 - 145 mmol/L   Potassium 4.2 3.5 - 5.1 mmol/L   Chloride 104 101 - 111 mmol/L   CO2 34 (H) 22 - 32 mmol/L   Glucose, Bld 82 65 - 99 mg/dL   BUN 6 6 - 20 mg/dL   Creatinine, Ser 0.84 0.44 - 1.00 mg/dL   Calcium 9.1 8.9 - 10.3 mg/dL   GFR calc non Af Amer >60 >60 mL/min   GFR calc Af Amer >60 >60 mL/min    Comment: (NOTE) The eGFR has been calculated using the CKD EPI equation. This calculation has not been validated in all clinical situations. eGFR's persistently <60 mL/min signify possible Chronic Kidney Disease.    Anion gap 5 5 - 15  Brain natriuretic peptide     Status: Abnormal   Collection Time: 11/19/14 10:04 PM  Result Value Ref Range   B Natriuretic Peptide 129.9 (H) 0.0 - 100.0 pg/mL  Troponin I     Status: None   Collection Time: 11/19/14 10:04 PM  Result Value Ref Range   Troponin I <0.03 <0.031 ng/mL    Comment:        NO INDICATION OF MYOCARDIAL INJURY.   CBC with Differential     Status: Abnormal   Collection Time: 11/19/14 10:04 PM  Result Value Ref Range   WBC 7.1 4.0 - 10.5 K/uL   RBC 4.10 3.87 - 5.11 MIL/uL   Hemoglobin 12.1 12.0 - 15.0 g/dL   HCT 38.2 36.0 - 46.0 %   MCV 93.2 78.0 - 100.0 fL   MCH 29.5 26.0 - 34.0 pg   MCHC 31.7 30.0 - 36.0 g/dL   RDW 13.6 11.5 - 15.5 %   Platelets 427 (H) 150 - 400 K/uL   Neutrophils Relative % 43 43 - 77 %   Neutro Abs 3.1 1.7 - 7.7 K/uL   Lymphocytes Relative 46 12 - 46 %   Lymphs Abs 3.3 0.7 - 4.0 K/uL   Monocytes Relative 7 3 - 12 %   Monocytes Absolute 0.5 0.1 - 1.0 K/uL   Eosinophils Relative 3 0 - 5 %   Eosinophils Absolute 0.2 0.0 - 0.7 K/uL   Basophils Relative 1 0 - 1 %   Basophils Absolute 0.0 0.0 - 0.1 K/uL  Salicylate level     Status: None   Collection Time: 11/19/14 10:04 PM  Result Value Ref Range   Salicylate Lvl <2.7 2.8 - 30.0 mg/dL  Acetaminophen level     Status: Abnormal   Collection Time: 11/19/14 10:04 PM  Result Value Ref  Range   Acetaminophen (Tylenol), Serum <10 (L) 10 - 30 ug/mL    Comment:        THERAPEUTIC CONCENTRATIONS VARY SIGNIFICANTLY. A RANGE OF 10-30 ug/mL MAY BE AN EFFECTIVE CONCENTRATION FOR MANY PATIENTS. HOWEVER, SOME ARE BEST TREATED AT CONCENTRATIONS OUTSIDE THIS RANGE. ACETAMINOPHEN CONCENTRATIONS >150 ug/mL AT 4 HOURS AFTER INGESTION AND >50 ug/mL AT 12 HOURS AFTER INGESTION ARE OFTEN ASSOCIATED WITH TOXIC REACTIONS.   Urinalysis, Routine w reflex microscopic (not at [facility])     Status: Abnormal   Collection Time: 11/19/14 10:32 PM  Result Value Ref Range   Color, Urine YELLOW YELLOW   APPearance CLEAR CLEAR   Specific Gravity, Urine 1.003 (L) 1.005 - 1.030   pH 7.0 5.0 - 8.0   Glucose, UA NEGATIVE NEGATIVE mg/dL   Hgb urine dipstick NEGATIVE NEGATIVE   Bilirubin Urine NEGATIVE NEGATIVE   Ketones, ur NEGATIVE NEGATIVE mg/dL   Protein, ur NEGATIVE NEGATIVE mg/dL   Urobilinogen, UA 0.2 0.0 - 1.0 mg/dL   Nitrite NEGATIVE NEGATIVE   Leukocytes, UA TRACE (A) NEGATIVE  Urine rapid drug screen (hosp performed)     Status: Abnormal   Collection Time: 11/19/14 10:32 PM  Result Value Ref Range   Opiates NONE DETECTED NONE DETECTED   Cocaine NONE DETECTED NONE DETECTED   Benzodiazepines POSITIVE (A) NONE DETECTED   Amphetamines NONE DETECTED NONE DETECTED   Tetrahydrocannabinol NONE DETECTED NONE DETECTED   Barbiturates NONE DETECTED NONE DETECTED    Comment:        DRUG SCREEN FOR MEDICAL PURPOSES ONLY.  IF CONFIRMATION IS NEEDED FOR ANY PURPOSE, NOTIFY LAB WITHIN 5 DAYS.  LOWEST DETECTABLE LIMITS FOR URINE DRUG SCREEN Drug Class       Cutoff (ng/mL) Amphetamine      1000 Barbiturate      200 Benzodiazepine   001 Tricyclics       809 Opiates          300 Cocaine          300 THC              50   Urine microscopic-add on     Status: Abnormal   Collection Time: 11/19/14 10:32 PM  Result Value Ref Range   Squamous Epithelial / LPF FEW (A) RARE   WBC, UA 0-2 <3  WBC/hpf    Assessment/Plan: Depression due to dementia With recent suicide gesture and subsequent hospitalization. Patient followed by psychiatry. Son encouraged to schedule follow-up appointment. He is to make our office aware if he has any difficulty scheduling follow-up within the next 2 weeks. Continue Remeron, sertraline and trazodone as directed by psychiatrist. Continue Aricept 10 mg daily. Giving nighttime agitation, fill it is okay to start Klonopin 0.5 mg nightly if needed. Discussed with PCP, Dr. Jolayne Haines who is in agreement. Patient and son are both aware that this medication regimen may be changed when a follow-up with psychiatrist. Alarm signs and symptoms reviewed with patient and son. They're to call 911 if any of these occur.

## 2014-12-17 ENCOUNTER — Ambulatory Visit: Payer: Medicare Other | Admitting: Family Medicine

## 2014-12-18 ENCOUNTER — Telehealth: Payer: Self-pay | Admitting: Family Medicine

## 2014-12-18 NOTE — Telephone Encounter (Signed)
Reason for call: Pt has a cough. Family is afraid to give her OTC meds b/c she has so many RXs. Please call son Louie Casa at [phone].

## 2014-12-18 NOTE — Telephone Encounter (Signed)
If they can find Elderberry liquid OTC I find that can sooth a cough and that will not interact with her meds but we also can call in Tessalon Perles 100 mg caps 1 cap po bid prn cough, disp #20

## 2014-12-18 NOTE — Telephone Encounter (Signed)
Please advise 

## 2014-12-19 MED ORDER — BENZONATATE 100 MG PO CAPS: 100.0000 mg | ORAL_CAPSULE | Freq: Two times a day (BID) | ORAL | Status: AC | PRN

## 2014-12-19 NOTE — Telephone Encounter (Signed)
[name] has been informed of PCP instructions.  Sent in [name] to the [facility] in [place] as well.

## 2014-12-22 ENCOUNTER — Other Ambulatory Visit: Payer: Self-pay | Admitting: Family Medicine

## 2014-12-22 NOTE — Telephone Encounter (Signed)
OK to refill the Tramadol

## 2014-12-23 NOTE — Telephone Encounter (Signed)
PCP did Ok refill of Tramadol.  Printed and on counter for PCP to sign on 12/24/14 when arrives back in the office.  Once signature obtained by PCP will then fax to [facility] in [place].

## 2014-12-24 DIAGNOSIS — F418 Other specified anxiety disorders: Secondary | ICD-10-CM | POA: Diagnosis not present

## 2014-12-24 DIAGNOSIS — F0390 Unspecified dementia without behavioral disturbance: Secondary | ICD-10-CM | POA: Diagnosis not present

## 2014-12-24 DIAGNOSIS — G4700 Insomnia, unspecified: Secondary | ICD-10-CM | POA: Diagnosis not present

## 2014-12-24 DIAGNOSIS — J449 Chronic obstructive pulmonary disease, unspecified: Secondary | ICD-10-CM | POA: Diagnosis not present

## 2014-12-24 DIAGNOSIS — K529 Noninfective gastroenteritis and colitis, unspecified: Secondary | ICD-10-CM | POA: Diagnosis not present

## 2014-12-24 DIAGNOSIS — I10 Essential (primary) hypertension: Secondary | ICD-10-CM | POA: Diagnosis not present

## 2014-12-24 DIAGNOSIS — G250 Essential tremor: Secondary | ICD-10-CM | POA: Diagnosis not present

## 2014-12-26 DIAGNOSIS — J449 Chronic obstructive pulmonary disease, unspecified: Secondary | ICD-10-CM | POA: Diagnosis not present

## 2015-01-03 DIAGNOSIS — E876 Hypokalemia: Secondary | ICD-10-CM | POA: Diagnosis not present

## 2015-01-03 DIAGNOSIS — J449 Chronic obstructive pulmonary disease, unspecified: Secondary | ICD-10-CM | POA: Diagnosis not present

## 2015-01-03 DIAGNOSIS — R5381 Other malaise: Secondary | ICD-10-CM | POA: Diagnosis not present

## 2015-01-03 DIAGNOSIS — R197 Diarrhea, unspecified: Secondary | ICD-10-CM | POA: Diagnosis not present

## 2015-01-03 DIAGNOSIS — F3341 Major depressive disorder, recurrent, in partial remission: Secondary | ICD-10-CM | POA: Diagnosis not present

## 2015-01-08 DIAGNOSIS — F419 Anxiety disorder, unspecified: Secondary | ICD-10-CM | POA: Diagnosis not present

## 2015-01-08 DIAGNOSIS — M6281 Muscle weakness (generalized): Secondary | ICD-10-CM | POA: Diagnosis not present

## 2015-01-08 DIAGNOSIS — R2689 Other abnormalities of gait and mobility: Secondary | ICD-10-CM | POA: Diagnosis not present

## 2015-01-08 DIAGNOSIS — E876 Hypokalemia: Secondary | ICD-10-CM | POA: Diagnosis not present

## 2015-01-08 DIAGNOSIS — F3341 Major depressive disorder, recurrent, in partial remission: Secondary | ICD-10-CM | POA: Diagnosis not present

## 2015-01-08 DIAGNOSIS — Z9181 History of falling: Secondary | ICD-10-CM | POA: Diagnosis not present

## 2015-01-08 DIAGNOSIS — Z7982 Long term (current) use of aspirin: Secondary | ICD-10-CM | POA: Diagnosis not present

## 2015-01-08 DIAGNOSIS — J449 Chronic obstructive pulmonary disease, unspecified: Secondary | ICD-10-CM | POA: Diagnosis not present

## 2015-01-08 DIAGNOSIS — M419 Scoliosis, unspecified: Secondary | ICD-10-CM | POA: Diagnosis not present

## 2015-01-09 ENCOUNTER — Other Ambulatory Visit: Payer: Self-pay | Admitting: Family Medicine

## 2015-01-09 NOTE — Telephone Encounter (Signed)
Tramadol not due til September 1

## 2015-01-09 NOTE — Telephone Encounter (Signed)
Requesting: Tramadol Contract  07/11/14 UDS  Low risk Last OV   09/09/14 Last Refill   #30 on 12/23/14  Please Advise

## 2015-01-14 DIAGNOSIS — F419 Anxiety disorder, unspecified: Secondary | ICD-10-CM | POA: Diagnosis not present

## 2015-01-14 DIAGNOSIS — Z7982 Long term (current) use of aspirin: Secondary | ICD-10-CM | POA: Diagnosis not present

## 2015-01-14 DIAGNOSIS — M6281 Muscle weakness (generalized): Secondary | ICD-10-CM | POA: Diagnosis not present

## 2015-01-14 DIAGNOSIS — M419 Scoliosis, unspecified: Secondary | ICD-10-CM | POA: Diagnosis not present

## 2015-01-14 DIAGNOSIS — E876 Hypokalemia: Secondary | ICD-10-CM | POA: Diagnosis not present

## 2015-01-14 DIAGNOSIS — J449 Chronic obstructive pulmonary disease, unspecified: Secondary | ICD-10-CM | POA: Diagnosis not present

## 2015-01-14 DIAGNOSIS — Z9181 History of falling: Secondary | ICD-10-CM | POA: Diagnosis not present

## 2015-01-14 DIAGNOSIS — F3341 Major depressive disorder, recurrent, in partial remission: Secondary | ICD-10-CM | POA: Diagnosis not present

## 2015-01-14 DIAGNOSIS — R2689 Other abnormalities of gait and mobility: Secondary | ICD-10-CM | POA: Diagnosis not present

## 2015-01-16 DIAGNOSIS — F3341 Major depressive disorder, recurrent, in partial remission: Secondary | ICD-10-CM | POA: Diagnosis not present

## 2015-01-16 DIAGNOSIS — Z7982 Long term (current) use of aspirin: Secondary | ICD-10-CM | POA: Diagnosis not present

## 2015-01-16 DIAGNOSIS — Z9181 History of falling: Secondary | ICD-10-CM | POA: Diagnosis not present

## 2015-01-16 DIAGNOSIS — F419 Anxiety disorder, unspecified: Secondary | ICD-10-CM | POA: Diagnosis not present

## 2015-01-16 DIAGNOSIS — J449 Chronic obstructive pulmonary disease, unspecified: Secondary | ICD-10-CM | POA: Diagnosis not present

## 2015-01-16 DIAGNOSIS — M6281 Muscle weakness (generalized): Secondary | ICD-10-CM | POA: Diagnosis not present

## 2015-01-16 DIAGNOSIS — R2689 Other abnormalities of gait and mobility: Secondary | ICD-10-CM | POA: Diagnosis not present

## 2015-01-16 DIAGNOSIS — E876 Hypokalemia: Secondary | ICD-10-CM | POA: Diagnosis not present

## 2015-01-16 DIAGNOSIS — M419 Scoliosis, unspecified: Secondary | ICD-10-CM | POA: Diagnosis not present

## 2015-01-23 DIAGNOSIS — F419 Anxiety disorder, unspecified: Secondary | ICD-10-CM | POA: Diagnosis not present

## 2015-01-23 DIAGNOSIS — M6281 Muscle weakness (generalized): Secondary | ICD-10-CM | POA: Diagnosis not present

## 2015-01-23 DIAGNOSIS — E876 Hypokalemia: Secondary | ICD-10-CM | POA: Diagnosis not present

## 2015-01-23 DIAGNOSIS — J449 Chronic obstructive pulmonary disease, unspecified: Secondary | ICD-10-CM | POA: Diagnosis not present

## 2015-01-23 DIAGNOSIS — F3341 Major depressive disorder, recurrent, in partial remission: Secondary | ICD-10-CM | POA: Diagnosis not present

## 2015-01-23 DIAGNOSIS — Z9181 History of falling: Secondary | ICD-10-CM | POA: Diagnosis not present

## 2015-01-23 DIAGNOSIS — Z7982 Long term (current) use of aspirin: Secondary | ICD-10-CM | POA: Diagnosis not present

## 2015-01-23 DIAGNOSIS — R2689 Other abnormalities of gait and mobility: Secondary | ICD-10-CM | POA: Diagnosis not present

## 2015-01-23 DIAGNOSIS — M419 Scoliosis, unspecified: Secondary | ICD-10-CM | POA: Diagnosis not present

## 2015-01-25 ENCOUNTER — Other Ambulatory Visit: Payer: Self-pay | Admitting: Family Medicine

## 2015-01-26 DIAGNOSIS — J449 Chronic obstructive pulmonary disease, unspecified: Secondary | ICD-10-CM | POA: Diagnosis not present

## 2015-01-26 NOTE — Telephone Encounter (Signed)
OK to refill Tramadol same sig, same number 

## 2015-01-28 NOTE — Telephone Encounter (Signed)
Printed and on counter for signature. Will fax to [facility]

## 2015-01-30 DIAGNOSIS — Z9181 History of falling: Secondary | ICD-10-CM | POA: Diagnosis not present

## 2015-01-30 DIAGNOSIS — R2689 Other abnormalities of gait and mobility: Secondary | ICD-10-CM | POA: Diagnosis not present

## 2015-01-30 DIAGNOSIS — M6281 Muscle weakness (generalized): Secondary | ICD-10-CM | POA: Diagnosis not present

## 2015-01-30 DIAGNOSIS — F3341 Major depressive disorder, recurrent, in partial remission: Secondary | ICD-10-CM | POA: Diagnosis not present

## 2015-01-30 DIAGNOSIS — J449 Chronic obstructive pulmonary disease, unspecified: Secondary | ICD-10-CM | POA: Diagnosis not present

## 2015-01-30 DIAGNOSIS — F419 Anxiety disorder, unspecified: Secondary | ICD-10-CM | POA: Diagnosis not present

## 2015-01-30 DIAGNOSIS — E876 Hypokalemia: Secondary | ICD-10-CM | POA: Diagnosis not present

## 2015-01-30 DIAGNOSIS — Z7982 Long term (current) use of aspirin: Secondary | ICD-10-CM | POA: Diagnosis not present

## 2015-01-30 DIAGNOSIS — M419 Scoliosis, unspecified: Secondary | ICD-10-CM | POA: Diagnosis not present

## 2015-01-31 DIAGNOSIS — K649 Unspecified hemorrhoids: Secondary | ICD-10-CM | POA: Diagnosis not present

## 2015-02-06 DIAGNOSIS — Z7982 Long term (current) use of aspirin: Secondary | ICD-10-CM | POA: Diagnosis not present

## 2015-02-06 DIAGNOSIS — F419 Anxiety disorder, unspecified: Secondary | ICD-10-CM | POA: Diagnosis not present

## 2015-02-06 DIAGNOSIS — J449 Chronic obstructive pulmonary disease, unspecified: Secondary | ICD-10-CM | POA: Diagnosis not present

## 2015-02-06 DIAGNOSIS — Z9181 History of falling: Secondary | ICD-10-CM | POA: Diagnosis not present

## 2015-02-06 DIAGNOSIS — E876 Hypokalemia: Secondary | ICD-10-CM | POA: Diagnosis not present

## 2015-02-06 DIAGNOSIS — M6281 Muscle weakness (generalized): Secondary | ICD-10-CM | POA: Diagnosis not present

## 2015-02-06 DIAGNOSIS — M419 Scoliosis, unspecified: Secondary | ICD-10-CM | POA: Diagnosis not present

## 2015-02-06 DIAGNOSIS — R2689 Other abnormalities of gait and mobility: Secondary | ICD-10-CM | POA: Diagnosis not present

## 2015-02-06 DIAGNOSIS — F3341 Major depressive disorder, recurrent, in partial remission: Secondary | ICD-10-CM | POA: Diagnosis not present

## 2015-02-09 ENCOUNTER — Other Ambulatory Visit: Payer: Self-pay | Admitting: Physician Assistant

## 2015-02-09 NOTE — Telephone Encounter (Signed)
Will defer further refills to PCP. 

## 2015-02-10 NOTE — Telephone Encounter (Signed)
Faxed hardcopy for Clonazepam to [facility]

## 2015-02-10 NOTE — Telephone Encounter (Signed)
Faxed hardcopy for clonazepam to [facility]

## 2015-02-13 DIAGNOSIS — S098XXA Other specified injuries of head, initial encounter: Secondary | ICD-10-CM | POA: Diagnosis not present

## 2015-02-14 DIAGNOSIS — J449 Chronic obstructive pulmonary disease, unspecified: Secondary | ICD-10-CM | POA: Diagnosis not present

## 2015-02-24 ENCOUNTER — Other Ambulatory Visit: Payer: Self-pay | Admitting: Physician Assistant

## 2015-02-24 NOTE — Telephone Encounter (Signed)
Will defer further refills of patient's medications to PCP  

## 2015-02-25 DIAGNOSIS — J449 Chronic obstructive pulmonary disease, unspecified: Secondary | ICD-10-CM | POA: Diagnosis not present

## 2015-03-13 DIAGNOSIS — R410 Disorientation, unspecified: Secondary | ICD-10-CM | POA: Diagnosis not present

## 2015-03-13 DIAGNOSIS — F419 Anxiety disorder, unspecified: Secondary | ICD-10-CM | POA: Diagnosis not present

## 2015-03-13 DIAGNOSIS — R454 Irritability and anger: Secondary | ICD-10-CM | POA: Diagnosis not present

## 2015-03-16 DIAGNOSIS — J449 Chronic obstructive pulmonary disease, unspecified: Secondary | ICD-10-CM | POA: Diagnosis not present

## 2015-03-28 DIAGNOSIS — J449 Chronic obstructive pulmonary disease, unspecified: Secondary | ICD-10-CM | POA: Diagnosis not present

## 2015-04-01 DIAGNOSIS — D72829 Elevated white blood cell count, unspecified: Secondary | ICD-10-CM | POA: Diagnosis not present

## 2015-04-01 DIAGNOSIS — N8110 Cystocele, unspecified: Secondary | ICD-10-CM | POA: Diagnosis not present

## 2015-04-01 DIAGNOSIS — E876 Hypokalemia: Secondary | ICD-10-CM | POA: Diagnosis not present

## 2015-04-16 DIAGNOSIS — J449 Chronic obstructive pulmonary disease, unspecified: Secondary | ICD-10-CM | POA: Diagnosis not present

## 2015-04-27 DIAGNOSIS — J449 Chronic obstructive pulmonary disease, unspecified: Secondary | ICD-10-CM | POA: Diagnosis not present

## 2015-05-16 DIAGNOSIS — J449 Chronic obstructive pulmonary disease, unspecified: Secondary | ICD-10-CM | POA: Diagnosis not present

## 2015-05-28 DIAGNOSIS — J449 Chronic obstructive pulmonary disease, unspecified: Secondary | ICD-10-CM | POA: Diagnosis not present

## 2015-06-09 DIAGNOSIS — R600 Localized edema: Secondary | ICD-10-CM | POA: Diagnosis not present

## 2015-06-09 DIAGNOSIS — M7062 Trochanteric bursitis, left hip: Secondary | ICD-10-CM | POA: Diagnosis not present

## 2015-06-28 DIAGNOSIS — J449 Chronic obstructive pulmonary disease, unspecified: Secondary | ICD-10-CM | POA: Diagnosis not present

## 2015-07-07 DIAGNOSIS — J302 Other seasonal allergic rhinitis: Secondary | ICD-10-CM | POA: Diagnosis not present

## 2015-07-07 DIAGNOSIS — I10 Essential (primary) hypertension: Secondary | ICD-10-CM | POA: Diagnosis not present

## 2015-07-07 DIAGNOSIS — M25552 Pain in left hip: Secondary | ICD-10-CM | POA: Diagnosis not present

## 2015-07-26 DIAGNOSIS — J449 Chronic obstructive pulmonary disease, unspecified: Secondary | ICD-10-CM | POA: Diagnosis not present

## 2015-07-28 DIAGNOSIS — M130 Polyarthritis, unspecified: Secondary | ICD-10-CM | POA: Diagnosis not present

## 2015-07-28 DIAGNOSIS — Z23 Encounter for immunization: Secondary | ICD-10-CM | POA: Diagnosis not present

## 2015-07-28 DIAGNOSIS — F419 Anxiety disorder, unspecified: Secondary | ICD-10-CM | POA: Diagnosis not present

## 2015-07-28 DIAGNOSIS — I10 Essential (primary) hypertension: Secondary | ICD-10-CM | POA: Diagnosis not present

## 2015-07-28 DIAGNOSIS — R630 Anorexia: Secondary | ICD-10-CM | POA: Diagnosis not present

## 2015-07-28 DIAGNOSIS — J4540 Moderate persistent asthma, uncomplicated: Secondary | ICD-10-CM | POA: Diagnosis not present

## 2015-07-28 DIAGNOSIS — F5104 Psychophysiologic insomnia: Secondary | ICD-10-CM | POA: Diagnosis not present

## 2015-08-26 DIAGNOSIS — M150 Primary generalized (osteo)arthritis: Secondary | ICD-10-CM | POA: Diagnosis not present

## 2015-08-26 DIAGNOSIS — J449 Chronic obstructive pulmonary disease, unspecified: Secondary | ICD-10-CM | POA: Diagnosis not present

## 2015-09-25 DIAGNOSIS — J449 Chronic obstructive pulmonary disease, unspecified: Secondary | ICD-10-CM | POA: Diagnosis not present

## 2015-09-30 DIAGNOSIS — M19072 Primary osteoarthritis, left ankle and foot: Secondary | ICD-10-CM | POA: Diagnosis not present

## 2015-09-30 DIAGNOSIS — J449 Chronic obstructive pulmonary disease, unspecified: Secondary | ICD-10-CM | POA: Diagnosis not present

## 2015-09-30 DIAGNOSIS — M19071 Primary osteoarthritis, right ankle and foot: Secondary | ICD-10-CM | POA: Diagnosis not present

## 2015-10-22 IMAGING — CT CT CERVICAL SPINE W/O CM
2 of 4 series · 6 of 14 positions shown, 7 images · non-contrast
Comparison: 06/24/2014 head CT.  Cervical spine CT 07/09/2007

CLINICAL DATA: Fall on deck this morning with right rib pain and
hematoma.

EXAM:
CT HEAD WITHOUT CONTRAST
CT CERVICAL SPINE WITHOUT CONTRAST
TECHNIQUE: Multidetector CT imaging of the head and cervical spine was
performed following the standard protocol without intravenous
contrast. Multiplanar CT image reconstructions of the cervical spine
were also generated.

[Series 5: c-spine st · axial · 0.36mm/px · z∈[+1260,+1348]mm · 3 of 88 slices shown]
[im 22/88  bone]
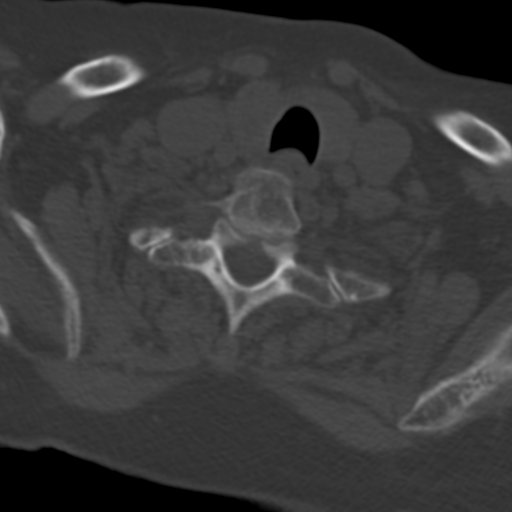
[im 44/88  bone]
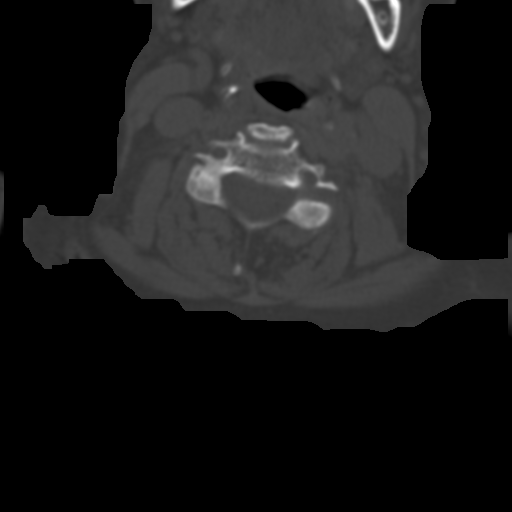
[im 66/88  bone]
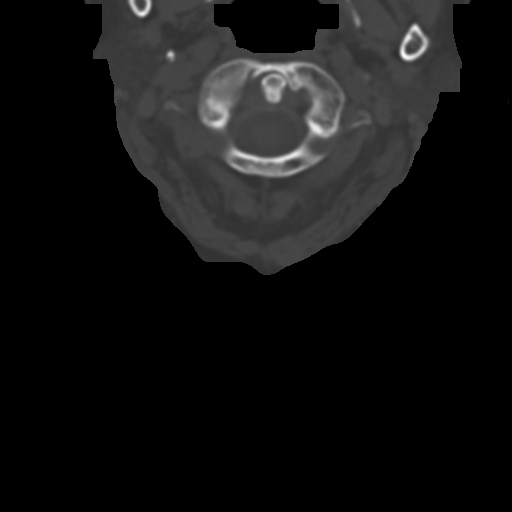

[Series 10: axial recon · axial · 0.23mm/px · z∈[+1229,+1325]mm · 3 of 100 slices shown, 4 images]
[im 25/100  soft-tissue]
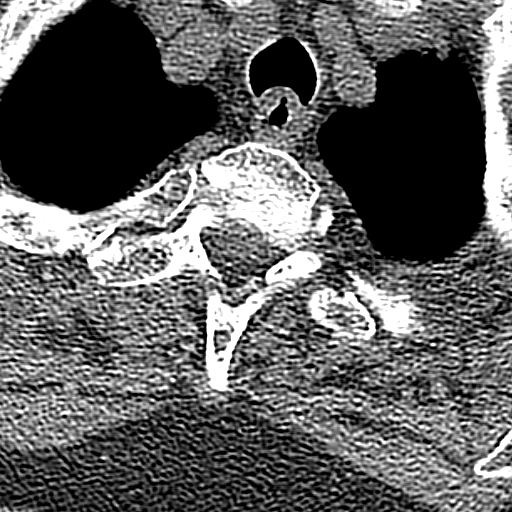
[im 25/100  bone]
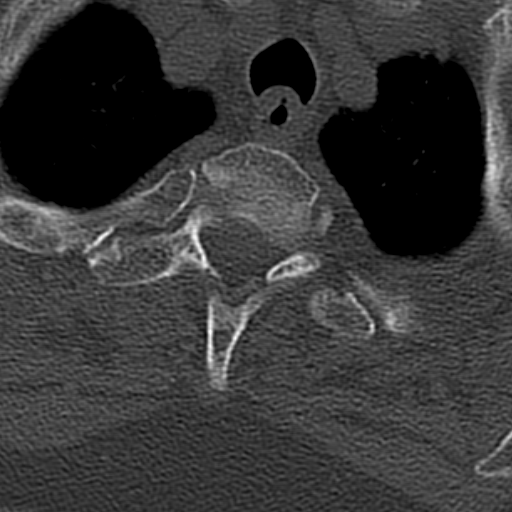
[im 50/100  bone]
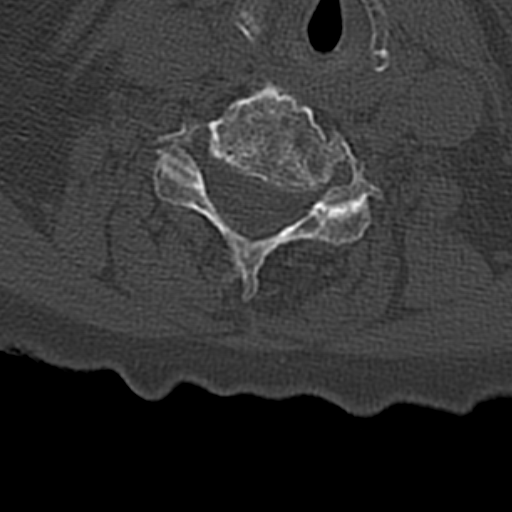
[im 75/100  bone]
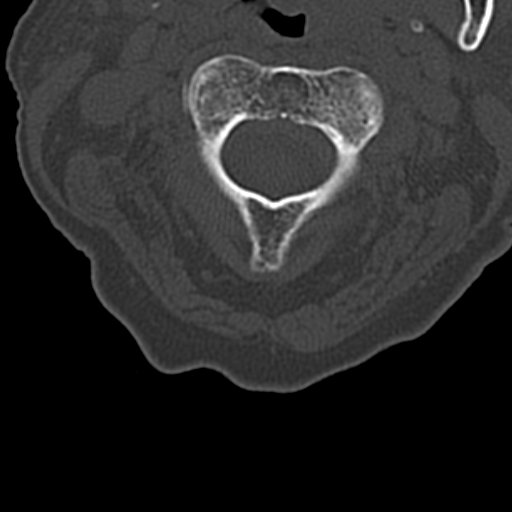

[6 of 14 positions shown; findings below may reference images not displayed]

FINDINGS: CT HEAD FINDINGS

Skull and Sinuses:Negative for fracture or destructive process. The
mastoids, middle ears, and imaged paranasal sinuses are clear.

Orbits: Bilateral cataract resection.  No traumatic findings.

Brain: No evidence of acute infarction, hemorrhage, hydrocephalus,
or mass lesion/mass effect. Brain atrophy with sparing of sulcal
widening at the vertex and in the occipital regions, stable from
9449. Stable white matter disease around the atria of the lateral
ventricles, mild for age.

CT CERVICAL SPINE FINDINGS

No acute fracture or traumatic malalignment. C5-6 and C6-7 anterior
cervical discectomy with complete bony fusion. Adjacent level
degenerative change has progressed from 8669, especially notable in
the right C7-T1 facet where there is remodeling and new subchondral
cystic formation. These changes account for mild anterolisthesis at
the cervicothoracic junction. Degenerative disc changes have also
advanced at C3-4 and C4-5. No gross cervical canal hematoma or
prevertebral edema.

Mild medialization of the right vocal cord with laryngeal ventricle
and piriform sinus enlargement, findings which suggest focal cord
paresis. This finding is stable from 8669 and without visible cause
in the neck or upper chest.
IMPRESSION: 1. No evidence of acute intracranial or cervical spine injury.
2. Incidental findings are noted above.

## 2015-10-25 IMAGING — DX DG CHEST 2V
2 series · 2 of 2 positions shown · non-contrast
Comparison: 08/12/2014

CLINICAL DATA: RIGHT side rib mid chest pain, upper back pain,
cough, fell onto RIGHT side on 08/12/2014 ; history asthma, COPD,
GERD

EXAM:
CHEST  2 VIEW

[chest lat]
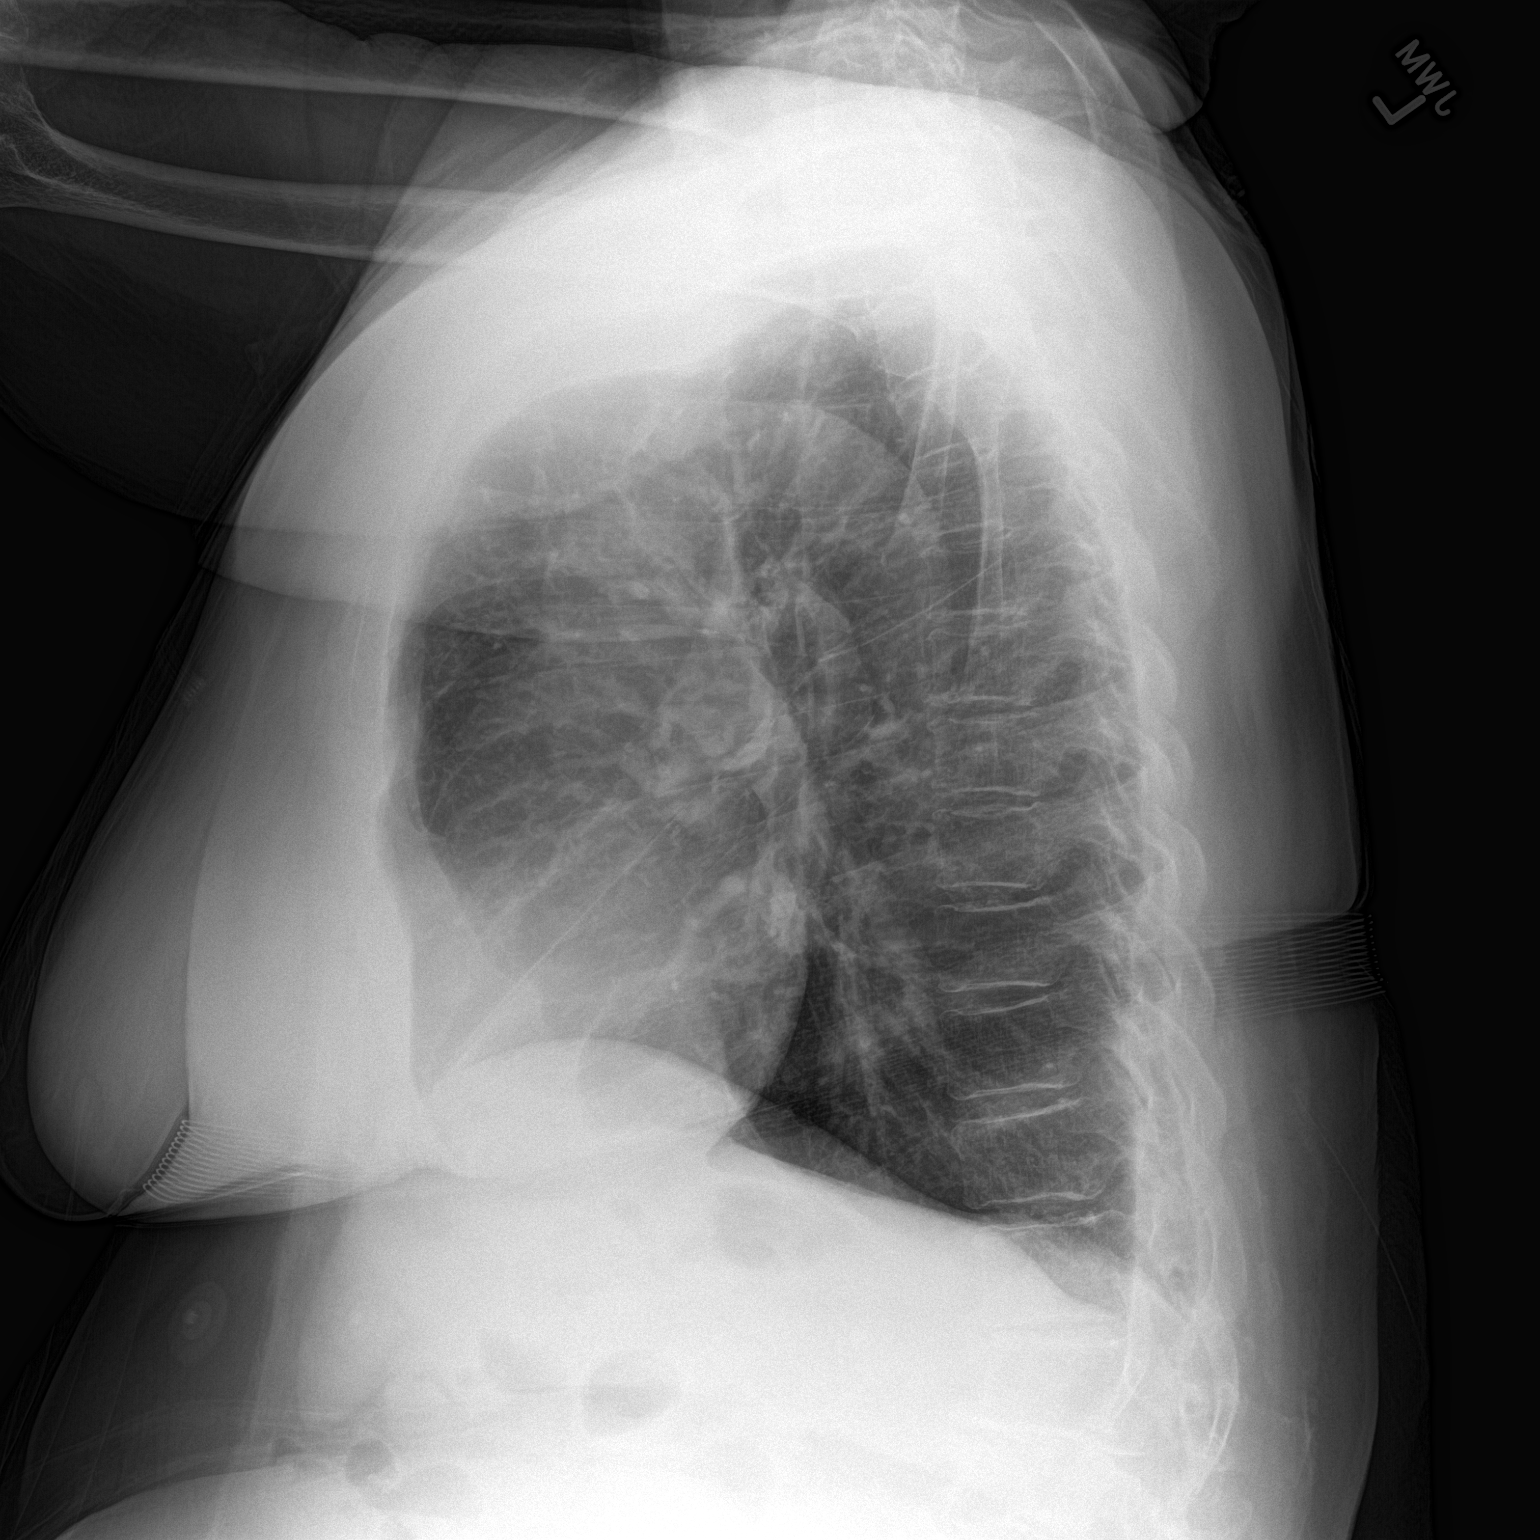

[chest ap]
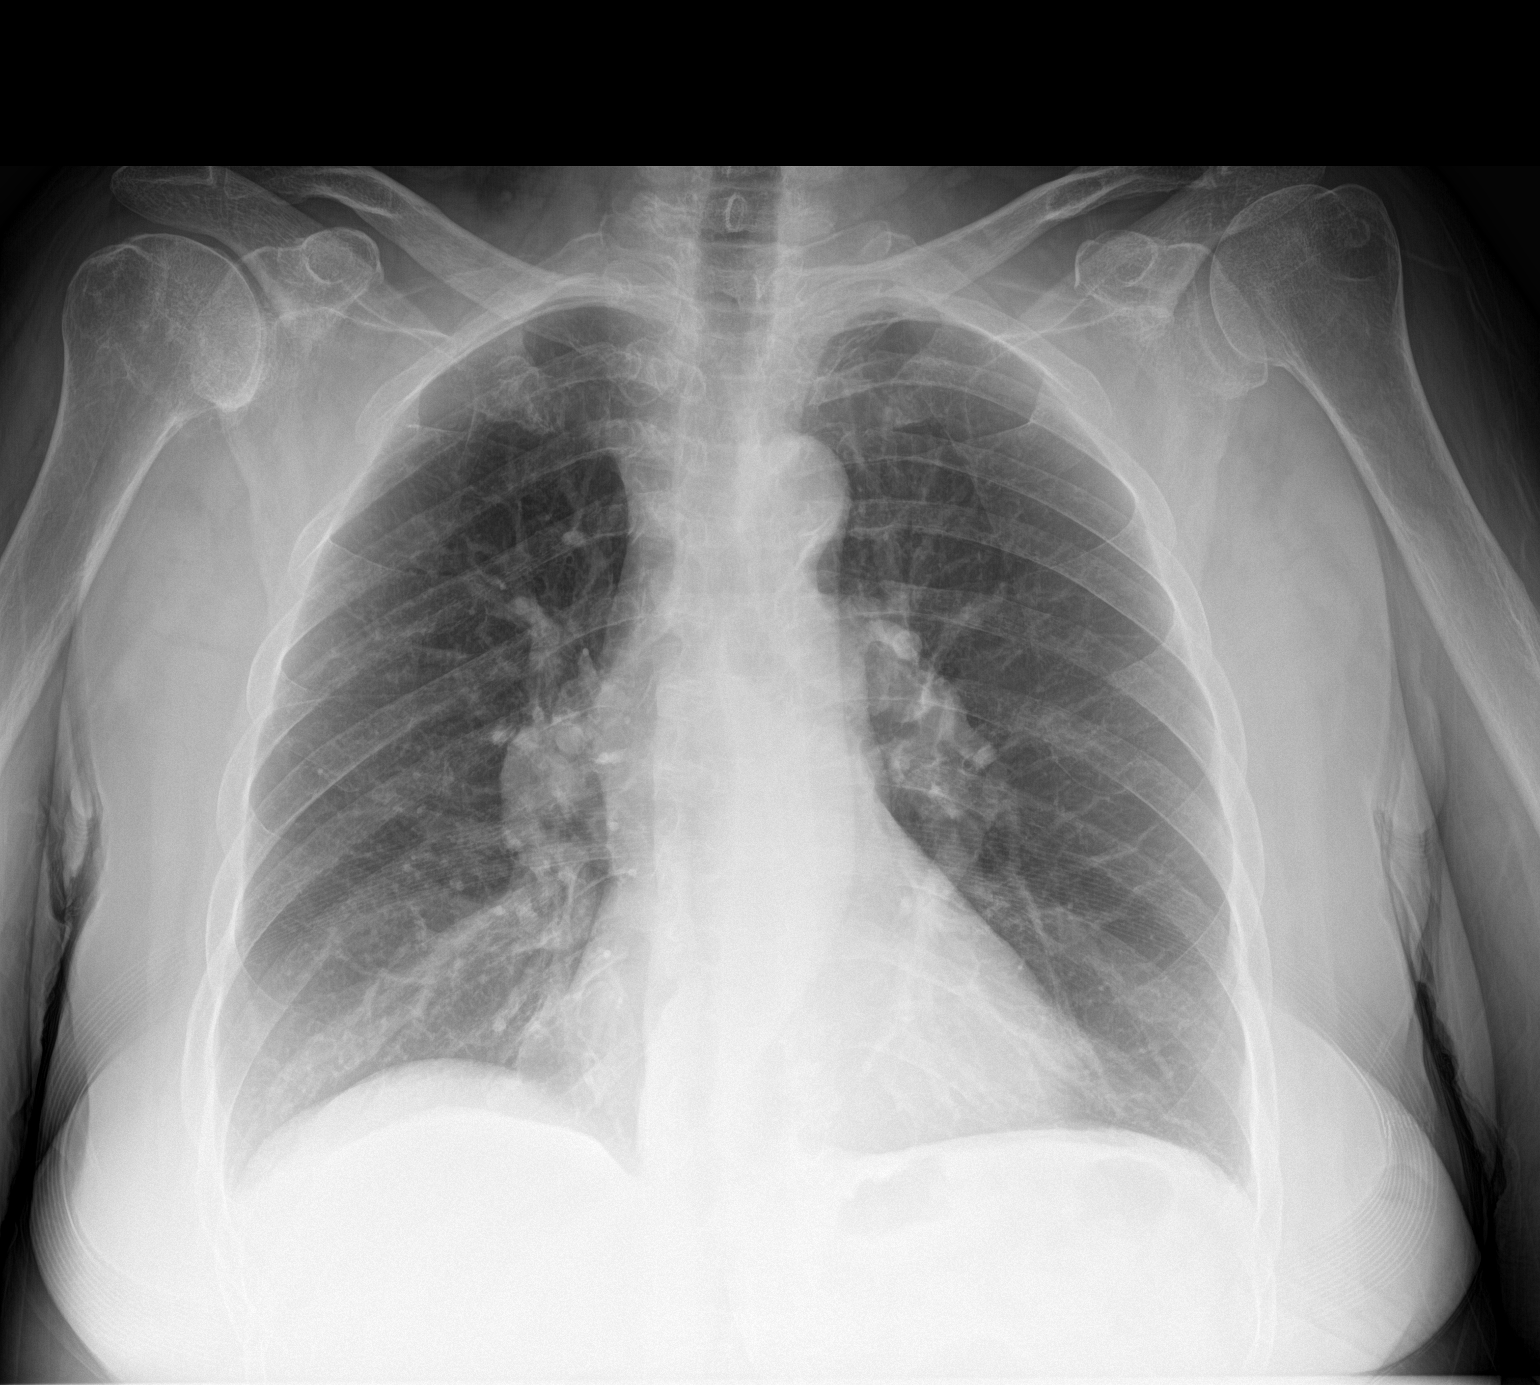

[2 of 2 positions shown; findings below may reference images not displayed]

FINDINGS: Upper normal heart size.

Calcification and mild elongation of thoracic aorta.

Mediastinal contours normal.

Mildly enlarged central pulmonary arteries.

Lungs clear.

No infiltrate, pleural effusion or pneumothorax.

Bones demineralized.

No definite rib fractures of bone destruction.
IMPRESSION: No acute abnormalities.

## 2015-10-26 DIAGNOSIS — J449 Chronic obstructive pulmonary disease, unspecified: Secondary | ICD-10-CM | POA: Diagnosis not present

## 2015-11-18 DIAGNOSIS — F419 Anxiety disorder, unspecified: Secondary | ICD-10-CM | POA: Diagnosis not present

## 2015-11-18 DIAGNOSIS — L089 Local infection of the skin and subcutaneous tissue, unspecified: Secondary | ICD-10-CM | POA: Diagnosis not present

## 2015-11-18 DIAGNOSIS — R454 Irritability and anger: Secondary | ICD-10-CM | POA: Diagnosis not present

## 2015-11-18 DIAGNOSIS — R8290 Unspecified abnormal findings in urine: Secondary | ICD-10-CM | POA: Diagnosis not present

## 2015-11-18 DIAGNOSIS — R4182 Altered mental status, unspecified: Secondary | ICD-10-CM | POA: Diagnosis not present

## 2015-11-18 DIAGNOSIS — F329 Major depressive disorder, single episode, unspecified: Secondary | ICD-10-CM | POA: Diagnosis not present

## 2015-11-25 DIAGNOSIS — J449 Chronic obstructive pulmonary disease, unspecified: Secondary | ICD-10-CM | POA: Diagnosis not present

## 2015-11-28 DIAGNOSIS — L03031 Cellulitis of right toe: Secondary | ICD-10-CM | POA: Diagnosis not present

## 2015-11-28 DIAGNOSIS — B961 Klebsiella pneumoniae [K. pneumoniae] as the cause of diseases classified elsewhere: Secondary | ICD-10-CM | POA: Diagnosis not present

## 2015-11-28 DIAGNOSIS — J9611 Chronic respiratory failure with hypoxia: Secondary | ICD-10-CM | POA: Diagnosis not present

## 2015-11-28 DIAGNOSIS — J441 Chronic obstructive pulmonary disease with (acute) exacerbation: Secondary | ICD-10-CM | POA: Diagnosis not present

## 2015-11-28 DIAGNOSIS — Z79899 Other long term (current) drug therapy: Secondary | ICD-10-CM | POA: Diagnosis not present

## 2015-11-28 DIAGNOSIS — J440 Chronic obstructive pulmonary disease with acute lower respiratory infection: Secondary | ICD-10-CM | POA: Diagnosis not present

## 2015-11-28 DIAGNOSIS — L0390 Cellulitis, unspecified: Secondary | ICD-10-CM | POA: Diagnosis not present

## 2015-11-28 DIAGNOSIS — J9612 Chronic respiratory failure with hypercapnia: Secondary | ICD-10-CM | POA: Diagnosis not present

## 2015-11-28 DIAGNOSIS — R0600 Dyspnea, unspecified: Secondary | ICD-10-CM | POA: Diagnosis not present

## 2015-11-28 DIAGNOSIS — Z7409 Other reduced mobility: Secondary | ICD-10-CM | POA: Diagnosis not present

## 2015-11-28 DIAGNOSIS — Z9981 Dependence on supplemental oxygen: Secondary | ICD-10-CM | POA: Diagnosis not present

## 2015-11-28 DIAGNOSIS — N390 Urinary tract infection, site not specified: Secondary | ICD-10-CM | POA: Diagnosis not present

## 2015-11-28 DIAGNOSIS — J189 Pneumonia, unspecified organism: Secondary | ICD-10-CM | POA: Diagnosis not present

## 2015-11-28 DIAGNOSIS — K219 Gastro-esophageal reflux disease without esophagitis: Secondary | ICD-10-CM | POA: Diagnosis not present

## 2015-11-28 DIAGNOSIS — R5381 Other malaise: Secondary | ICD-10-CM | POA: Diagnosis not present

## 2015-12-11 DIAGNOSIS — E559 Vitamin D deficiency, unspecified: Secondary | ICD-10-CM | POA: Diagnosis not present

## 2015-12-11 DIAGNOSIS — I10 Essential (primary) hypertension: Secondary | ICD-10-CM | POA: Diagnosis not present

## 2015-12-11 DIAGNOSIS — K210 Gastro-esophageal reflux disease with esophagitis: Secondary | ICD-10-CM | POA: Diagnosis not present

## 2015-12-11 DIAGNOSIS — M1990 Unspecified osteoarthritis, unspecified site: Secondary | ICD-10-CM | POA: Diagnosis not present

## 2015-12-11 DIAGNOSIS — J449 Chronic obstructive pulmonary disease, unspecified: Secondary | ICD-10-CM | POA: Diagnosis not present

## 2015-12-13 DIAGNOSIS — Z993 Dependence on wheelchair: Secondary | ICD-10-CM | POA: Diagnosis not present

## 2015-12-13 DIAGNOSIS — M25552 Pain in left hip: Secondary | ICD-10-CM | POA: Diagnosis not present

## 2015-12-13 DIAGNOSIS — M1990 Unspecified osteoarthritis, unspecified site: Secondary | ICD-10-CM | POA: Diagnosis not present

## 2015-12-13 DIAGNOSIS — R2681 Unsteadiness on feet: Secondary | ICD-10-CM | POA: Diagnosis not present

## 2015-12-13 DIAGNOSIS — J189 Pneumonia, unspecified organism: Secondary | ICD-10-CM | POA: Diagnosis not present

## 2015-12-13 DIAGNOSIS — M6281 Muscle weakness (generalized): Secondary | ICD-10-CM | POA: Diagnosis not present

## 2015-12-13 DIAGNOSIS — G309 Alzheimer's disease, unspecified: Secondary | ICD-10-CM | POA: Diagnosis not present

## 2015-12-13 DIAGNOSIS — Z7952 Long term (current) use of systemic steroids: Secondary | ICD-10-CM | POA: Diagnosis not present

## 2015-12-13 DIAGNOSIS — Z7982 Long term (current) use of aspirin: Secondary | ICD-10-CM | POA: Diagnosis not present

## 2015-12-17 DIAGNOSIS — G309 Alzheimer's disease, unspecified: Secondary | ICD-10-CM | POA: Diagnosis not present

## 2015-12-17 DIAGNOSIS — Z7952 Long term (current) use of systemic steroids: Secondary | ICD-10-CM | POA: Diagnosis not present

## 2015-12-17 DIAGNOSIS — Z7982 Long term (current) use of aspirin: Secondary | ICD-10-CM | POA: Diagnosis not present

## 2015-12-17 DIAGNOSIS — M6281 Muscle weakness (generalized): Secondary | ICD-10-CM | POA: Diagnosis not present

## 2015-12-17 DIAGNOSIS — Z993 Dependence on wheelchair: Secondary | ICD-10-CM | POA: Diagnosis not present

## 2015-12-17 DIAGNOSIS — M25552 Pain in left hip: Secondary | ICD-10-CM | POA: Diagnosis not present

## 2015-12-17 DIAGNOSIS — M1990 Unspecified osteoarthritis, unspecified site: Secondary | ICD-10-CM | POA: Diagnosis not present

## 2015-12-17 DIAGNOSIS — R2681 Unsteadiness on feet: Secondary | ICD-10-CM | POA: Diagnosis not present

## 2015-12-17 DIAGNOSIS — J189 Pneumonia, unspecified organism: Secondary | ICD-10-CM | POA: Diagnosis not present

## 2015-12-19 DIAGNOSIS — M25552 Pain in left hip: Secondary | ICD-10-CM | POA: Diagnosis not present

## 2015-12-19 DIAGNOSIS — G309 Alzheimer's disease, unspecified: Secondary | ICD-10-CM | POA: Diagnosis not present

## 2015-12-19 DIAGNOSIS — Z7982 Long term (current) use of aspirin: Secondary | ICD-10-CM | POA: Diagnosis not present

## 2015-12-19 DIAGNOSIS — M1990 Unspecified osteoarthritis, unspecified site: Secondary | ICD-10-CM | POA: Diagnosis not present

## 2015-12-19 DIAGNOSIS — J189 Pneumonia, unspecified organism: Secondary | ICD-10-CM | POA: Diagnosis not present

## 2015-12-19 DIAGNOSIS — Z7952 Long term (current) use of systemic steroids: Secondary | ICD-10-CM | POA: Diagnosis not present

## 2015-12-19 DIAGNOSIS — M6281 Muscle weakness (generalized): Secondary | ICD-10-CM | POA: Diagnosis not present

## 2015-12-19 DIAGNOSIS — R2681 Unsteadiness on feet: Secondary | ICD-10-CM | POA: Diagnosis not present

## 2015-12-19 DIAGNOSIS — Z993 Dependence on wheelchair: Secondary | ICD-10-CM | POA: Diagnosis not present

## 2015-12-24 DIAGNOSIS — M25552 Pain in left hip: Secondary | ICD-10-CM | POA: Diagnosis not present

## 2015-12-24 DIAGNOSIS — Z7952 Long term (current) use of systemic steroids: Secondary | ICD-10-CM | POA: Diagnosis not present

## 2015-12-24 DIAGNOSIS — R2681 Unsteadiness on feet: Secondary | ICD-10-CM | POA: Diagnosis not present

## 2015-12-24 DIAGNOSIS — J189 Pneumonia, unspecified organism: Secondary | ICD-10-CM | POA: Diagnosis not present

## 2015-12-24 DIAGNOSIS — Z993 Dependence on wheelchair: Secondary | ICD-10-CM | POA: Diagnosis not present

## 2015-12-24 DIAGNOSIS — G309 Alzheimer's disease, unspecified: Secondary | ICD-10-CM | POA: Diagnosis not present

## 2015-12-24 DIAGNOSIS — Z7982 Long term (current) use of aspirin: Secondary | ICD-10-CM | POA: Diagnosis not present

## 2015-12-24 DIAGNOSIS — M6281 Muscle weakness (generalized): Secondary | ICD-10-CM | POA: Diagnosis not present

## 2015-12-24 DIAGNOSIS — M1990 Unspecified osteoarthritis, unspecified site: Secondary | ICD-10-CM | POA: Diagnosis not present

## 2015-12-26 DIAGNOSIS — J449 Chronic obstructive pulmonary disease, unspecified: Secondary | ICD-10-CM | POA: Diagnosis not present

## 2015-12-26 DIAGNOSIS — Z993 Dependence on wheelchair: Secondary | ICD-10-CM | POA: Diagnosis not present

## 2015-12-26 DIAGNOSIS — M25552 Pain in left hip: Secondary | ICD-10-CM | POA: Diagnosis not present

## 2015-12-26 DIAGNOSIS — R2681 Unsteadiness on feet: Secondary | ICD-10-CM | POA: Diagnosis not present

## 2015-12-26 DIAGNOSIS — M6281 Muscle weakness (generalized): Secondary | ICD-10-CM | POA: Diagnosis not present

## 2015-12-26 DIAGNOSIS — Z7952 Long term (current) use of systemic steroids: Secondary | ICD-10-CM | POA: Diagnosis not present

## 2015-12-26 DIAGNOSIS — M1990 Unspecified osteoarthritis, unspecified site: Secondary | ICD-10-CM | POA: Diagnosis not present

## 2015-12-26 DIAGNOSIS — J189 Pneumonia, unspecified organism: Secondary | ICD-10-CM | POA: Diagnosis not present

## 2015-12-26 DIAGNOSIS — G309 Alzheimer's disease, unspecified: Secondary | ICD-10-CM | POA: Diagnosis not present

## 2015-12-26 DIAGNOSIS — Z7982 Long term (current) use of aspirin: Secondary | ICD-10-CM | POA: Diagnosis not present

## 2015-12-28 DIAGNOSIS — Z885 Allergy status to narcotic agent status: Secondary | ICD-10-CM | POA: Diagnosis not present

## 2015-12-28 DIAGNOSIS — Z880 Allergy status to penicillin: Secondary | ICD-10-CM | POA: Diagnosis not present

## 2015-12-28 DIAGNOSIS — R062 Wheezing: Secondary | ICD-10-CM | POA: Diagnosis not present

## 2015-12-28 DIAGNOSIS — J45909 Unspecified asthma, uncomplicated: Secondary | ICD-10-CM | POA: Diagnosis not present

## 2015-12-28 DIAGNOSIS — Z23 Encounter for immunization: Secondary | ICD-10-CM | POA: Diagnosis not present

## 2015-12-28 DIAGNOSIS — Z7951 Long term (current) use of inhaled steroids: Secondary | ICD-10-CM | POA: Diagnosis not present

## 2015-12-28 DIAGNOSIS — J440 Chronic obstructive pulmonary disease with acute lower respiratory infection: Secondary | ICD-10-CM | POA: Diagnosis not present

## 2015-12-28 DIAGNOSIS — Z87891 Personal history of nicotine dependence: Secondary | ICD-10-CM | POA: Diagnosis not present

## 2015-12-28 DIAGNOSIS — Z791 Long term (current) use of non-steroidal anti-inflammatories (NSAID): Secondary | ICD-10-CM | POA: Diagnosis not present

## 2015-12-28 DIAGNOSIS — Z91012 Allergy to eggs: Secondary | ICD-10-CM | POA: Diagnosis not present

## 2015-12-28 DIAGNOSIS — R001 Bradycardia, unspecified: Secondary | ICD-10-CM | POA: Diagnosis not present

## 2015-12-28 DIAGNOSIS — R0602 Shortness of breath: Secondary | ICD-10-CM | POA: Diagnosis not present

## 2015-12-28 DIAGNOSIS — Z888 Allergy status to other drugs, medicaments and biological substances status: Secondary | ICD-10-CM | POA: Diagnosis not present

## 2015-12-28 DIAGNOSIS — Z8249 Family history of ischemic heart disease and other diseases of the circulatory system: Secondary | ICD-10-CM | POA: Diagnosis not present

## 2015-12-28 DIAGNOSIS — K219 Gastro-esophageal reflux disease without esophagitis: Secondary | ICD-10-CM | POA: Diagnosis not present

## 2015-12-28 DIAGNOSIS — Z9049 Acquired absence of other specified parts of digestive tract: Secondary | ICD-10-CM | POA: Diagnosis not present

## 2015-12-28 DIAGNOSIS — Z881 Allergy status to other antibiotic agents status: Secondary | ICD-10-CM | POA: Diagnosis not present

## 2015-12-28 DIAGNOSIS — R918 Other nonspecific abnormal finding of lung field: Secondary | ICD-10-CM | POA: Diagnosis not present

## 2015-12-28 DIAGNOSIS — R0902 Hypoxemia: Secondary | ICD-10-CM | POA: Diagnosis not present

## 2015-12-28 DIAGNOSIS — Z79899 Other long term (current) drug therapy: Secondary | ICD-10-CM | POA: Diagnosis not present

## 2015-12-28 DIAGNOSIS — I10 Essential (primary) hypertension: Secondary | ICD-10-CM | POA: Diagnosis not present

## 2015-12-28 DIAGNOSIS — J189 Pneumonia, unspecified organism: Secondary | ICD-10-CM | POA: Diagnosis not present

## 2015-12-28 DIAGNOSIS — Z825 Family history of asthma and other chronic lower respiratory diseases: Secondary | ICD-10-CM | POA: Diagnosis not present

## 2015-12-28 DIAGNOSIS — J9612 Chronic respiratory failure with hypercapnia: Secondary | ICD-10-CM | POA: Diagnosis not present

## 2015-12-28 DIAGNOSIS — J441 Chronic obstructive pulmonary disease with (acute) exacerbation: Secondary | ICD-10-CM | POA: Diagnosis not present

## 2015-12-28 DIAGNOSIS — Z7982 Long term (current) use of aspirin: Secondary | ICD-10-CM | POA: Diagnosis not present

## 2015-12-29 DIAGNOSIS — J9612 Chronic respiratory failure with hypercapnia: Secondary | ICD-10-CM | POA: Diagnosis not present

## 2015-12-29 DIAGNOSIS — J189 Pneumonia, unspecified organism: Secondary | ICD-10-CM | POA: Diagnosis not present

## 2015-12-29 DIAGNOSIS — Z9981 Dependence on supplemental oxygen: Secondary | ICD-10-CM | POA: Diagnosis not present

## 2015-12-29 DIAGNOSIS — J441 Chronic obstructive pulmonary disease with (acute) exacerbation: Secondary | ICD-10-CM | POA: Diagnosis not present

## 2015-12-29 DIAGNOSIS — Z7401 Bed confinement status: Secondary | ICD-10-CM | POA: Diagnosis not present

## 2015-12-31 DIAGNOSIS — M1990 Unspecified osteoarthritis, unspecified site: Secondary | ICD-10-CM | POA: Diagnosis not present

## 2015-12-31 DIAGNOSIS — G309 Alzheimer's disease, unspecified: Secondary | ICD-10-CM | POA: Diagnosis not present

## 2015-12-31 DIAGNOSIS — Z7982 Long term (current) use of aspirin: Secondary | ICD-10-CM | POA: Diagnosis not present

## 2015-12-31 DIAGNOSIS — M25552 Pain in left hip: Secondary | ICD-10-CM | POA: Diagnosis not present

## 2015-12-31 DIAGNOSIS — M6281 Muscle weakness (generalized): Secondary | ICD-10-CM | POA: Diagnosis not present

## 2015-12-31 DIAGNOSIS — Z7952 Long term (current) use of systemic steroids: Secondary | ICD-10-CM | POA: Diagnosis not present

## 2015-12-31 DIAGNOSIS — J189 Pneumonia, unspecified organism: Secondary | ICD-10-CM | POA: Diagnosis not present

## 2015-12-31 DIAGNOSIS — Z993 Dependence on wheelchair: Secondary | ICD-10-CM | POA: Diagnosis not present

## 2015-12-31 DIAGNOSIS — R2681 Unsteadiness on feet: Secondary | ICD-10-CM | POA: Diagnosis not present

## 2016-01-02 DIAGNOSIS — Z7952 Long term (current) use of systemic steroids: Secondary | ICD-10-CM | POA: Diagnosis not present

## 2016-01-02 DIAGNOSIS — R2681 Unsteadiness on feet: Secondary | ICD-10-CM | POA: Diagnosis not present

## 2016-01-02 DIAGNOSIS — M1990 Unspecified osteoarthritis, unspecified site: Secondary | ICD-10-CM | POA: Diagnosis not present

## 2016-01-02 DIAGNOSIS — J189 Pneumonia, unspecified organism: Secondary | ICD-10-CM | POA: Diagnosis not present

## 2016-01-02 DIAGNOSIS — Z7982 Long term (current) use of aspirin: Secondary | ICD-10-CM | POA: Diagnosis not present

## 2016-01-02 DIAGNOSIS — G309 Alzheimer's disease, unspecified: Secondary | ICD-10-CM | POA: Diagnosis not present

## 2016-01-02 DIAGNOSIS — M25552 Pain in left hip: Secondary | ICD-10-CM | POA: Diagnosis not present

## 2016-01-02 DIAGNOSIS — Z993 Dependence on wheelchair: Secondary | ICD-10-CM | POA: Diagnosis not present

## 2016-01-02 DIAGNOSIS — M6281 Muscle weakness (generalized): Secondary | ICD-10-CM | POA: Diagnosis not present

## 2016-01-05 DIAGNOSIS — R2681 Unsteadiness on feet: Secondary | ICD-10-CM | POA: Diagnosis not present

## 2016-01-05 DIAGNOSIS — M1990 Unspecified osteoarthritis, unspecified site: Secondary | ICD-10-CM | POA: Diagnosis not present

## 2016-01-05 DIAGNOSIS — Z7952 Long term (current) use of systemic steroids: Secondary | ICD-10-CM | POA: Diagnosis not present

## 2016-01-05 DIAGNOSIS — M6281 Muscle weakness (generalized): Secondary | ICD-10-CM | POA: Diagnosis not present

## 2016-01-05 DIAGNOSIS — Z7982 Long term (current) use of aspirin: Secondary | ICD-10-CM | POA: Diagnosis not present

## 2016-01-05 DIAGNOSIS — G309 Alzheimer's disease, unspecified: Secondary | ICD-10-CM | POA: Diagnosis not present

## 2016-01-05 DIAGNOSIS — Z993 Dependence on wheelchair: Secondary | ICD-10-CM | POA: Diagnosis not present

## 2016-01-05 DIAGNOSIS — J189 Pneumonia, unspecified organism: Secondary | ICD-10-CM | POA: Diagnosis not present

## 2016-01-05 DIAGNOSIS — M25552 Pain in left hip: Secondary | ICD-10-CM | POA: Diagnosis not present

## 2016-01-06 DIAGNOSIS — G309 Alzheimer's disease, unspecified: Secondary | ICD-10-CM | POA: Diagnosis not present

## 2016-01-06 DIAGNOSIS — Z7982 Long term (current) use of aspirin: Secondary | ICD-10-CM | POA: Diagnosis not present

## 2016-01-06 DIAGNOSIS — Z7952 Long term (current) use of systemic steroids: Secondary | ICD-10-CM | POA: Diagnosis not present

## 2016-01-06 DIAGNOSIS — R2681 Unsteadiness on feet: Secondary | ICD-10-CM | POA: Diagnosis not present

## 2016-01-06 DIAGNOSIS — M6281 Muscle weakness (generalized): Secondary | ICD-10-CM | POA: Diagnosis not present

## 2016-01-06 DIAGNOSIS — M1990 Unspecified osteoarthritis, unspecified site: Secondary | ICD-10-CM | POA: Diagnosis not present

## 2016-01-06 DIAGNOSIS — Z993 Dependence on wheelchair: Secondary | ICD-10-CM | POA: Diagnosis not present

## 2016-01-06 DIAGNOSIS — J189 Pneumonia, unspecified organism: Secondary | ICD-10-CM | POA: Diagnosis not present

## 2016-01-06 DIAGNOSIS — M25552 Pain in left hip: Secondary | ICD-10-CM | POA: Diagnosis not present

## 2016-01-07 DIAGNOSIS — Z993 Dependence on wheelchair: Secondary | ICD-10-CM | POA: Diagnosis not present

## 2016-01-07 DIAGNOSIS — Z7952 Long term (current) use of systemic steroids: Secondary | ICD-10-CM | POA: Diagnosis not present

## 2016-01-07 DIAGNOSIS — J189 Pneumonia, unspecified organism: Secondary | ICD-10-CM | POA: Diagnosis not present

## 2016-01-07 DIAGNOSIS — R2681 Unsteadiness on feet: Secondary | ICD-10-CM | POA: Diagnosis not present

## 2016-01-07 DIAGNOSIS — M1990 Unspecified osteoarthritis, unspecified site: Secondary | ICD-10-CM | POA: Diagnosis not present

## 2016-01-07 DIAGNOSIS — Z7982 Long term (current) use of aspirin: Secondary | ICD-10-CM | POA: Diagnosis not present

## 2016-01-07 DIAGNOSIS — M6281 Muscle weakness (generalized): Secondary | ICD-10-CM | POA: Diagnosis not present

## 2016-01-07 DIAGNOSIS — M25552 Pain in left hip: Secondary | ICD-10-CM | POA: Diagnosis not present

## 2016-01-07 DIAGNOSIS — G309 Alzheimer's disease, unspecified: Secondary | ICD-10-CM | POA: Diagnosis not present

## 2016-01-08 DIAGNOSIS — R2681 Unsteadiness on feet: Secondary | ICD-10-CM | POA: Diagnosis not present

## 2016-01-08 DIAGNOSIS — Z7952 Long term (current) use of systemic steroids: Secondary | ICD-10-CM | POA: Diagnosis not present

## 2016-01-08 DIAGNOSIS — M1990 Unspecified osteoarthritis, unspecified site: Secondary | ICD-10-CM | POA: Diagnosis not present

## 2016-01-08 DIAGNOSIS — M6281 Muscle weakness (generalized): Secondary | ICD-10-CM | POA: Diagnosis not present

## 2016-01-08 DIAGNOSIS — E559 Vitamin D deficiency, unspecified: Secondary | ICD-10-CM | POA: Diagnosis not present

## 2016-01-08 DIAGNOSIS — G309 Alzheimer's disease, unspecified: Secondary | ICD-10-CM | POA: Diagnosis not present

## 2016-01-08 DIAGNOSIS — J189 Pneumonia, unspecified organism: Secondary | ICD-10-CM | POA: Diagnosis not present

## 2016-01-08 DIAGNOSIS — J449 Chronic obstructive pulmonary disease, unspecified: Secondary | ICD-10-CM | POA: Diagnosis not present

## 2016-01-08 DIAGNOSIS — Z7982 Long term (current) use of aspirin: Secondary | ICD-10-CM | POA: Diagnosis not present

## 2016-01-08 DIAGNOSIS — S91302D Unspecified open wound, left foot, subsequent encounter: Secondary | ICD-10-CM | POA: Diagnosis not present

## 2016-01-08 DIAGNOSIS — Z993 Dependence on wheelchair: Secondary | ICD-10-CM | POA: Diagnosis not present

## 2016-01-08 DIAGNOSIS — K210 Gastro-esophageal reflux disease with esophagitis: Secondary | ICD-10-CM | POA: Diagnosis not present

## 2016-01-08 DIAGNOSIS — M25552 Pain in left hip: Secondary | ICD-10-CM | POA: Diagnosis not present

## 2016-01-12 DIAGNOSIS — Z7982 Long term (current) use of aspirin: Secondary | ICD-10-CM | POA: Diagnosis not present

## 2016-01-12 DIAGNOSIS — J189 Pneumonia, unspecified organism: Secondary | ICD-10-CM | POA: Diagnosis not present

## 2016-01-12 DIAGNOSIS — M1990 Unspecified osteoarthritis, unspecified site: Secondary | ICD-10-CM | POA: Diagnosis not present

## 2016-01-12 DIAGNOSIS — M6281 Muscle weakness (generalized): Secondary | ICD-10-CM | POA: Diagnosis not present

## 2016-01-12 DIAGNOSIS — R2681 Unsteadiness on feet: Secondary | ICD-10-CM | POA: Diagnosis not present

## 2016-01-12 DIAGNOSIS — Z993 Dependence on wheelchair: Secondary | ICD-10-CM | POA: Diagnosis not present

## 2016-01-12 DIAGNOSIS — G309 Alzheimer's disease, unspecified: Secondary | ICD-10-CM | POA: Diagnosis not present

## 2016-01-12 DIAGNOSIS — M25552 Pain in left hip: Secondary | ICD-10-CM | POA: Diagnosis not present

## 2016-01-12 DIAGNOSIS — Z7952 Long term (current) use of systemic steroids: Secondary | ICD-10-CM | POA: Diagnosis not present

## 2016-01-14 DIAGNOSIS — R2681 Unsteadiness on feet: Secondary | ICD-10-CM | POA: Diagnosis not present

## 2016-01-14 DIAGNOSIS — M6281 Muscle weakness (generalized): Secondary | ICD-10-CM | POA: Diagnosis not present

## 2016-01-14 DIAGNOSIS — J189 Pneumonia, unspecified organism: Secondary | ICD-10-CM | POA: Diagnosis not present

## 2016-01-14 DIAGNOSIS — M1990 Unspecified osteoarthritis, unspecified site: Secondary | ICD-10-CM | POA: Diagnosis not present

## 2016-01-14 DIAGNOSIS — Z7982 Long term (current) use of aspirin: Secondary | ICD-10-CM | POA: Diagnosis not present

## 2016-01-14 DIAGNOSIS — Z7952 Long term (current) use of systemic steroids: Secondary | ICD-10-CM | POA: Diagnosis not present

## 2016-01-14 DIAGNOSIS — Z993 Dependence on wheelchair: Secondary | ICD-10-CM | POA: Diagnosis not present

## 2016-01-14 DIAGNOSIS — M25552 Pain in left hip: Secondary | ICD-10-CM | POA: Diagnosis not present

## 2016-01-14 DIAGNOSIS — G309 Alzheimer's disease, unspecified: Secondary | ICD-10-CM | POA: Diagnosis not present

## 2016-01-16 DIAGNOSIS — Z993 Dependence on wheelchair: Secondary | ICD-10-CM | POA: Diagnosis not present

## 2016-01-16 DIAGNOSIS — Z7982 Long term (current) use of aspirin: Secondary | ICD-10-CM | POA: Diagnosis not present

## 2016-01-16 DIAGNOSIS — J189 Pneumonia, unspecified organism: Secondary | ICD-10-CM | POA: Diagnosis not present

## 2016-01-16 DIAGNOSIS — Z7952 Long term (current) use of systemic steroids: Secondary | ICD-10-CM | POA: Diagnosis not present

## 2016-01-16 DIAGNOSIS — R2681 Unsteadiness on feet: Secondary | ICD-10-CM | POA: Diagnosis not present

## 2016-01-16 DIAGNOSIS — M1990 Unspecified osteoarthritis, unspecified site: Secondary | ICD-10-CM | POA: Diagnosis not present

## 2016-01-16 DIAGNOSIS — G309 Alzheimer's disease, unspecified: Secondary | ICD-10-CM | POA: Diagnosis not present

## 2016-01-16 DIAGNOSIS — M25552 Pain in left hip: Secondary | ICD-10-CM | POA: Diagnosis not present

## 2016-01-16 DIAGNOSIS — M6281 Muscle weakness (generalized): Secondary | ICD-10-CM | POA: Diagnosis not present

## 2016-01-19 DIAGNOSIS — L853 Xerosis cutis: Secondary | ICD-10-CM | POA: Diagnosis not present

## 2016-01-19 DIAGNOSIS — B351 Tinea unguium: Secondary | ICD-10-CM | POA: Diagnosis not present

## 2016-01-19 DIAGNOSIS — J189 Pneumonia, unspecified organism: Secondary | ICD-10-CM | POA: Diagnosis not present

## 2016-01-19 DIAGNOSIS — G309 Alzheimer's disease, unspecified: Secondary | ICD-10-CM | POA: Diagnosis not present

## 2016-01-19 DIAGNOSIS — R2681 Unsteadiness on feet: Secondary | ICD-10-CM | POA: Diagnosis not present

## 2016-01-19 DIAGNOSIS — M25552 Pain in left hip: Secondary | ICD-10-CM | POA: Diagnosis not present

## 2016-01-19 DIAGNOSIS — M1990 Unspecified osteoarthritis, unspecified site: Secondary | ICD-10-CM | POA: Diagnosis not present

## 2016-01-19 DIAGNOSIS — Z7982 Long term (current) use of aspirin: Secondary | ICD-10-CM | POA: Diagnosis not present

## 2016-01-19 DIAGNOSIS — Z7952 Long term (current) use of systemic steroids: Secondary | ICD-10-CM | POA: Diagnosis not present

## 2016-01-19 DIAGNOSIS — L603 Nail dystrophy: Secondary | ICD-10-CM | POA: Diagnosis not present

## 2016-01-19 DIAGNOSIS — M6281 Muscle weakness (generalized): Secondary | ICD-10-CM | POA: Diagnosis not present

## 2016-01-19 DIAGNOSIS — Q845 Enlarged and hypertrophic nails: Secondary | ICD-10-CM | POA: Diagnosis not present

## 2016-01-19 DIAGNOSIS — M2010 Hallux valgus (acquired), unspecified foot: Secondary | ICD-10-CM | POA: Diagnosis not present

## 2016-01-19 DIAGNOSIS — Z993 Dependence on wheelchair: Secondary | ICD-10-CM | POA: Diagnosis not present

## 2016-01-21 DIAGNOSIS — J449 Chronic obstructive pulmonary disease, unspecified: Secondary | ICD-10-CM | POA: Diagnosis not present

## 2016-01-21 DIAGNOSIS — J90 Pleural effusion, not elsewhere classified: Secondary | ICD-10-CM | POA: Diagnosis not present

## 2016-01-21 DIAGNOSIS — I10 Essential (primary) hypertension: Secondary | ICD-10-CM | POA: Diagnosis not present

## 2016-01-21 DIAGNOSIS — R0600 Dyspnea, unspecified: Secondary | ICD-10-CM | POA: Diagnosis not present

## 2016-01-21 DIAGNOSIS — Z7952 Long term (current) use of systemic steroids: Secondary | ICD-10-CM | POA: Diagnosis not present

## 2016-01-21 DIAGNOSIS — M25552 Pain in left hip: Secondary | ICD-10-CM | POA: Diagnosis not present

## 2016-01-21 DIAGNOSIS — J811 Chronic pulmonary edema: Secondary | ICD-10-CM | POA: Diagnosis not present

## 2016-01-21 DIAGNOSIS — J690 Pneumonitis due to inhalation of food and vomit: Secondary | ICD-10-CM | POA: Diagnosis not present

## 2016-01-21 DIAGNOSIS — J9612 Chronic respiratory failure with hypercapnia: Secondary | ICD-10-CM | POA: Diagnosis not present

## 2016-01-21 DIAGNOSIS — K219 Gastro-esophageal reflux disease without esophagitis: Secondary | ICD-10-CM | POA: Diagnosis not present

## 2016-01-21 DIAGNOSIS — R938 Abnormal findings on diagnostic imaging of other specified body structures: Secondary | ICD-10-CM | POA: Diagnosis not present

## 2016-01-21 DIAGNOSIS — R1310 Dysphagia, unspecified: Secondary | ICD-10-CM | POA: Diagnosis not present

## 2016-01-21 DIAGNOSIS — J189 Pneumonia, unspecified organism: Secondary | ICD-10-CM | POA: Diagnosis not present

## 2016-01-21 DIAGNOSIS — Z993 Dependence on wheelchair: Secondary | ICD-10-CM | POA: Diagnosis not present

## 2016-01-21 DIAGNOSIS — M1990 Unspecified osteoarthritis, unspecified site: Secondary | ICD-10-CM | POA: Diagnosis not present

## 2016-01-21 DIAGNOSIS — Z91012 Allergy to eggs: Secondary | ICD-10-CM | POA: Diagnosis not present

## 2016-01-21 DIAGNOSIS — R2681 Unsteadiness on feet: Secondary | ICD-10-CM | POA: Diagnosis not present

## 2016-01-21 DIAGNOSIS — Z880 Allergy status to penicillin: Secondary | ICD-10-CM | POA: Diagnosis not present

## 2016-01-21 DIAGNOSIS — R918 Other nonspecific abnormal finding of lung field: Secondary | ICD-10-CM | POA: Diagnosis not present

## 2016-01-21 DIAGNOSIS — Z9989 Dependence on other enabling machines and devices: Secondary | ICD-10-CM | POA: Diagnosis not present

## 2016-01-21 DIAGNOSIS — M6281 Muscle weakness (generalized): Secondary | ICD-10-CM | POA: Diagnosis not present

## 2016-01-21 DIAGNOSIS — R0602 Shortness of breath: Secondary | ICD-10-CM | POA: Diagnosis not present

## 2016-01-21 DIAGNOSIS — Z87891 Personal history of nicotine dependence: Secondary | ICD-10-CM | POA: Diagnosis not present

## 2016-01-21 DIAGNOSIS — Z9981 Dependence on supplemental oxygen: Secondary | ICD-10-CM | POA: Diagnosis not present

## 2016-01-21 DIAGNOSIS — G309 Alzheimer's disease, unspecified: Secondary | ICD-10-CM | POA: Diagnosis not present

## 2016-01-21 DIAGNOSIS — Z7982 Long term (current) use of aspirin: Secondary | ICD-10-CM | POA: Diagnosis not present

## 2016-01-21 DIAGNOSIS — R05 Cough: Secondary | ICD-10-CM | POA: Diagnosis not present

## 2016-01-21 DIAGNOSIS — J441 Chronic obstructive pulmonary disease with (acute) exacerbation: Secondary | ICD-10-CM | POA: Diagnosis not present

## 2016-01-30 DIAGNOSIS — M1991 Primary osteoarthritis, unspecified site: Secondary | ICD-10-CM | POA: Diagnosis not present

## 2016-01-30 DIAGNOSIS — M25511 Pain in right shoulder: Secondary | ICD-10-CM | POA: Diagnosis not present

## 2016-01-30 DIAGNOSIS — J189 Pneumonia, unspecified organism: Secondary | ICD-10-CM | POA: Diagnosis not present

## 2016-01-30 DIAGNOSIS — G309 Alzheimer's disease, unspecified: Secondary | ICD-10-CM | POA: Diagnosis not present

## 2016-01-30 DIAGNOSIS — J690 Pneumonitis due to inhalation of food and vomit: Secondary | ICD-10-CM | POA: Diagnosis not present

## 2016-01-30 DIAGNOSIS — R2681 Unsteadiness on feet: Secondary | ICD-10-CM | POA: Diagnosis not present

## 2016-01-30 DIAGNOSIS — Z7952 Long term (current) use of systemic steroids: Secondary | ICD-10-CM | POA: Diagnosis not present

## 2016-01-30 DIAGNOSIS — Z993 Dependence on wheelchair: Secondary | ICD-10-CM | POA: Diagnosis not present

## 2016-01-30 DIAGNOSIS — R251 Tremor, unspecified: Secondary | ICD-10-CM | POA: Diagnosis not present

## 2016-01-30 DIAGNOSIS — M25552 Pain in left hip: Secondary | ICD-10-CM | POA: Diagnosis not present

## 2016-01-30 DIAGNOSIS — J9612 Chronic respiratory failure with hypercapnia: Secondary | ICD-10-CM | POA: Diagnosis not present

## 2016-01-30 DIAGNOSIS — Z7982 Long term (current) use of aspirin: Secondary | ICD-10-CM | POA: Diagnosis not present

## 2016-01-30 DIAGNOSIS — M6281 Muscle weakness (generalized): Secondary | ICD-10-CM | POA: Diagnosis not present

## 2016-01-30 DIAGNOSIS — J441 Chronic obstructive pulmonary disease with (acute) exacerbation: Secondary | ICD-10-CM | POA: Diagnosis not present

## 2016-01-30 DIAGNOSIS — D649 Anemia, unspecified: Secondary | ICD-10-CM | POA: Diagnosis not present

## 2016-01-30 DIAGNOSIS — S91302D Unspecified open wound, left foot, subsequent encounter: Secondary | ICD-10-CM | POA: Diagnosis not present

## 2016-01-30 DIAGNOSIS — M1990 Unspecified osteoarthritis, unspecified site: Secondary | ICD-10-CM | POA: Diagnosis not present

## 2016-01-30 DIAGNOSIS — I10 Essential (primary) hypertension: Secondary | ICD-10-CM | POA: Diagnosis not present

## 2016-02-02 DIAGNOSIS — J9612 Chronic respiratory failure with hypercapnia: Secondary | ICD-10-CM | POA: Diagnosis not present

## 2016-02-02 DIAGNOSIS — S91302D Unspecified open wound, left foot, subsequent encounter: Secondary | ICD-10-CM | POA: Diagnosis not present

## 2016-02-02 DIAGNOSIS — Z7952 Long term (current) use of systemic steroids: Secondary | ICD-10-CM | POA: Diagnosis not present

## 2016-02-02 DIAGNOSIS — R2681 Unsteadiness on feet: Secondary | ICD-10-CM | POA: Diagnosis not present

## 2016-02-02 DIAGNOSIS — R251 Tremor, unspecified: Secondary | ICD-10-CM | POA: Diagnosis not present

## 2016-02-02 DIAGNOSIS — J690 Pneumonitis due to inhalation of food and vomit: Secondary | ICD-10-CM | POA: Diagnosis not present

## 2016-02-02 DIAGNOSIS — Z7982 Long term (current) use of aspirin: Secondary | ICD-10-CM | POA: Diagnosis not present

## 2016-02-02 DIAGNOSIS — J441 Chronic obstructive pulmonary disease with (acute) exacerbation: Secondary | ICD-10-CM | POA: Diagnosis not present

## 2016-02-02 DIAGNOSIS — M25552 Pain in left hip: Secondary | ICD-10-CM | POA: Diagnosis not present

## 2016-02-02 DIAGNOSIS — M25511 Pain in right shoulder: Secondary | ICD-10-CM | POA: Diagnosis not present

## 2016-02-02 DIAGNOSIS — M1991 Primary osteoarthritis, unspecified site: Secondary | ICD-10-CM | POA: Diagnosis not present

## 2016-02-02 DIAGNOSIS — D649 Anemia, unspecified: Secondary | ICD-10-CM | POA: Diagnosis not present

## 2016-02-02 DIAGNOSIS — J189 Pneumonia, unspecified organism: Secondary | ICD-10-CM | POA: Diagnosis not present

## 2016-02-02 DIAGNOSIS — Z993 Dependence on wheelchair: Secondary | ICD-10-CM | POA: Diagnosis not present

## 2016-02-02 DIAGNOSIS — M1990 Unspecified osteoarthritis, unspecified site: Secondary | ICD-10-CM | POA: Diagnosis not present

## 2016-02-02 DIAGNOSIS — M6281 Muscle weakness (generalized): Secondary | ICD-10-CM | POA: Diagnosis not present

## 2016-02-02 DIAGNOSIS — I10 Essential (primary) hypertension: Secondary | ICD-10-CM | POA: Diagnosis not present

## 2016-02-02 DIAGNOSIS — J154 Pneumonia due to other streptococci: Secondary | ICD-10-CM | POA: Diagnosis not present

## 2016-02-02 DIAGNOSIS — G309 Alzheimer's disease, unspecified: Secondary | ICD-10-CM | POA: Diagnosis not present

## 2016-02-03 DIAGNOSIS — M25552 Pain in left hip: Secondary | ICD-10-CM | POA: Diagnosis not present

## 2016-02-03 DIAGNOSIS — I10 Essential (primary) hypertension: Secondary | ICD-10-CM | POA: Diagnosis not present

## 2016-02-03 DIAGNOSIS — J9612 Chronic respiratory failure with hypercapnia: Secondary | ICD-10-CM | POA: Diagnosis not present

## 2016-02-03 DIAGNOSIS — M1991 Primary osteoarthritis, unspecified site: Secondary | ICD-10-CM | POA: Diagnosis not present

## 2016-02-03 DIAGNOSIS — R2681 Unsteadiness on feet: Secondary | ICD-10-CM | POA: Diagnosis not present

## 2016-02-03 DIAGNOSIS — Z7982 Long term (current) use of aspirin: Secondary | ICD-10-CM | POA: Diagnosis not present

## 2016-02-03 DIAGNOSIS — R251 Tremor, unspecified: Secondary | ICD-10-CM | POA: Diagnosis not present

## 2016-02-03 DIAGNOSIS — J441 Chronic obstructive pulmonary disease with (acute) exacerbation: Secondary | ICD-10-CM | POA: Diagnosis not present

## 2016-02-03 DIAGNOSIS — G309 Alzheimer's disease, unspecified: Secondary | ICD-10-CM | POA: Diagnosis not present

## 2016-02-03 DIAGNOSIS — M25511 Pain in right shoulder: Secondary | ICD-10-CM | POA: Diagnosis not present

## 2016-02-03 DIAGNOSIS — M6281 Muscle weakness (generalized): Secondary | ICD-10-CM | POA: Diagnosis not present

## 2016-02-03 DIAGNOSIS — S91302D Unspecified open wound, left foot, subsequent encounter: Secondary | ICD-10-CM | POA: Diagnosis not present

## 2016-02-03 DIAGNOSIS — J690 Pneumonitis due to inhalation of food and vomit: Secondary | ICD-10-CM | POA: Diagnosis not present

## 2016-02-03 DIAGNOSIS — Z993 Dependence on wheelchair: Secondary | ICD-10-CM | POA: Diagnosis not present

## 2016-02-03 DIAGNOSIS — Z7952 Long term (current) use of systemic steroids: Secondary | ICD-10-CM | POA: Diagnosis not present

## 2016-02-03 DIAGNOSIS — D649 Anemia, unspecified: Secondary | ICD-10-CM | POA: Diagnosis not present

## 2016-02-03 DIAGNOSIS — J189 Pneumonia, unspecified organism: Secondary | ICD-10-CM | POA: Diagnosis not present

## 2016-02-03 DIAGNOSIS — M1990 Unspecified osteoarthritis, unspecified site: Secondary | ICD-10-CM | POA: Diagnosis not present

## 2016-02-04 DIAGNOSIS — G309 Alzheimer's disease, unspecified: Secondary | ICD-10-CM | POA: Diagnosis not present

## 2016-02-04 DIAGNOSIS — R2681 Unsteadiness on feet: Secondary | ICD-10-CM | POA: Diagnosis not present

## 2016-02-04 DIAGNOSIS — Z7982 Long term (current) use of aspirin: Secondary | ICD-10-CM | POA: Diagnosis not present

## 2016-02-04 DIAGNOSIS — Z993 Dependence on wheelchair: Secondary | ICD-10-CM | POA: Diagnosis not present

## 2016-02-04 DIAGNOSIS — J441 Chronic obstructive pulmonary disease with (acute) exacerbation: Secondary | ICD-10-CM | POA: Diagnosis not present

## 2016-02-04 DIAGNOSIS — J189 Pneumonia, unspecified organism: Secondary | ICD-10-CM | POA: Diagnosis not present

## 2016-02-04 DIAGNOSIS — M25552 Pain in left hip: Secondary | ICD-10-CM | POA: Diagnosis not present

## 2016-02-04 DIAGNOSIS — S91302D Unspecified open wound, left foot, subsequent encounter: Secondary | ICD-10-CM | POA: Diagnosis not present

## 2016-02-04 DIAGNOSIS — R251 Tremor, unspecified: Secondary | ICD-10-CM | POA: Diagnosis not present

## 2016-02-04 DIAGNOSIS — M1990 Unspecified osteoarthritis, unspecified site: Secondary | ICD-10-CM | POA: Diagnosis not present

## 2016-02-04 DIAGNOSIS — Z7952 Long term (current) use of systemic steroids: Secondary | ICD-10-CM | POA: Diagnosis not present

## 2016-02-04 DIAGNOSIS — J690 Pneumonitis due to inhalation of food and vomit: Secondary | ICD-10-CM | POA: Diagnosis not present

## 2016-02-04 DIAGNOSIS — D649 Anemia, unspecified: Secondary | ICD-10-CM | POA: Diagnosis not present

## 2016-02-04 DIAGNOSIS — J9612 Chronic respiratory failure with hypercapnia: Secondary | ICD-10-CM | POA: Diagnosis not present

## 2016-02-04 DIAGNOSIS — M25511 Pain in right shoulder: Secondary | ICD-10-CM | POA: Diagnosis not present

## 2016-02-04 DIAGNOSIS — I10 Essential (primary) hypertension: Secondary | ICD-10-CM | POA: Diagnosis not present

## 2016-02-04 DIAGNOSIS — M6281 Muscle weakness (generalized): Secondary | ICD-10-CM | POA: Diagnosis not present

## 2016-02-04 DIAGNOSIS — M1991 Primary osteoarthritis, unspecified site: Secondary | ICD-10-CM | POA: Diagnosis not present

## 2016-02-05 DIAGNOSIS — M25552 Pain in left hip: Secondary | ICD-10-CM | POA: Diagnosis not present

## 2016-02-05 DIAGNOSIS — Z7982 Long term (current) use of aspirin: Secondary | ICD-10-CM | POA: Diagnosis not present

## 2016-02-05 DIAGNOSIS — S81802A Unspecified open wound, left lower leg, initial encounter: Secondary | ICD-10-CM | POA: Diagnosis not present

## 2016-02-05 DIAGNOSIS — M1990 Unspecified osteoarthritis, unspecified site: Secondary | ICD-10-CM | POA: Diagnosis not present

## 2016-02-05 DIAGNOSIS — D649 Anemia, unspecified: Secondary | ICD-10-CM | POA: Diagnosis not present

## 2016-02-05 DIAGNOSIS — J449 Chronic obstructive pulmonary disease, unspecified: Secondary | ICD-10-CM | POA: Diagnosis not present

## 2016-02-05 DIAGNOSIS — M6281 Muscle weakness (generalized): Secondary | ICD-10-CM | POA: Diagnosis not present

## 2016-02-05 DIAGNOSIS — R2681 Unsteadiness on feet: Secondary | ICD-10-CM | POA: Diagnosis not present

## 2016-02-05 DIAGNOSIS — J441 Chronic obstructive pulmonary disease with (acute) exacerbation: Secondary | ICD-10-CM | POA: Diagnosis not present

## 2016-02-05 DIAGNOSIS — Z993 Dependence on wheelchair: Secondary | ICD-10-CM | POA: Diagnosis not present

## 2016-02-05 DIAGNOSIS — R251 Tremor, unspecified: Secondary | ICD-10-CM | POA: Diagnosis not present

## 2016-02-05 DIAGNOSIS — S91302D Unspecified open wound, left foot, subsequent encounter: Secondary | ICD-10-CM | POA: Diagnosis not present

## 2016-02-05 DIAGNOSIS — J690 Pneumonitis due to inhalation of food and vomit: Secondary | ICD-10-CM | POA: Diagnosis not present

## 2016-02-05 DIAGNOSIS — J189 Pneumonia, unspecified organism: Secondary | ICD-10-CM | POA: Diagnosis not present

## 2016-02-05 DIAGNOSIS — B372 Candidiasis of skin and nail: Secondary | ICD-10-CM | POA: Diagnosis not present

## 2016-02-05 DIAGNOSIS — M1991 Primary osteoarthritis, unspecified site: Secondary | ICD-10-CM | POA: Diagnosis not present

## 2016-02-05 DIAGNOSIS — G309 Alzheimer's disease, unspecified: Secondary | ICD-10-CM | POA: Diagnosis not present

## 2016-02-05 DIAGNOSIS — M25511 Pain in right shoulder: Secondary | ICD-10-CM | POA: Diagnosis not present

## 2016-02-05 DIAGNOSIS — J9612 Chronic respiratory failure with hypercapnia: Secondary | ICD-10-CM | POA: Diagnosis not present

## 2016-02-05 DIAGNOSIS — I10 Essential (primary) hypertension: Secondary | ICD-10-CM | POA: Diagnosis not present

## 2016-02-05 DIAGNOSIS — Z7952 Long term (current) use of systemic steroids: Secondary | ICD-10-CM | POA: Diagnosis not present

## 2016-02-06 DIAGNOSIS — M25511 Pain in right shoulder: Secondary | ICD-10-CM | POA: Diagnosis not present

## 2016-02-06 DIAGNOSIS — Z7952 Long term (current) use of systemic steroids: Secondary | ICD-10-CM | POA: Diagnosis not present

## 2016-02-06 DIAGNOSIS — J189 Pneumonia, unspecified organism: Secondary | ICD-10-CM | POA: Diagnosis not present

## 2016-02-06 DIAGNOSIS — G309 Alzheimer's disease, unspecified: Secondary | ICD-10-CM | POA: Diagnosis not present

## 2016-02-06 DIAGNOSIS — I10 Essential (primary) hypertension: Secondary | ICD-10-CM | POA: Diagnosis not present

## 2016-02-06 DIAGNOSIS — S91302D Unspecified open wound, left foot, subsequent encounter: Secondary | ICD-10-CM | POA: Diagnosis not present

## 2016-02-06 DIAGNOSIS — M1991 Primary osteoarthritis, unspecified site: Secondary | ICD-10-CM | POA: Diagnosis not present

## 2016-02-06 DIAGNOSIS — D649 Anemia, unspecified: Secondary | ICD-10-CM | POA: Diagnosis not present

## 2016-02-06 DIAGNOSIS — R2681 Unsteadiness on feet: Secondary | ICD-10-CM | POA: Diagnosis not present

## 2016-02-06 DIAGNOSIS — M6281 Muscle weakness (generalized): Secondary | ICD-10-CM | POA: Diagnosis not present

## 2016-02-06 DIAGNOSIS — M1990 Unspecified osteoarthritis, unspecified site: Secondary | ICD-10-CM | POA: Diagnosis not present

## 2016-02-06 DIAGNOSIS — J441 Chronic obstructive pulmonary disease with (acute) exacerbation: Secondary | ICD-10-CM | POA: Diagnosis not present

## 2016-02-06 DIAGNOSIS — M25552 Pain in left hip: Secondary | ICD-10-CM | POA: Diagnosis not present

## 2016-02-06 DIAGNOSIS — R251 Tremor, unspecified: Secondary | ICD-10-CM | POA: Diagnosis not present

## 2016-02-06 DIAGNOSIS — J9612 Chronic respiratory failure with hypercapnia: Secondary | ICD-10-CM | POA: Diagnosis not present

## 2016-02-06 DIAGNOSIS — Z993 Dependence on wheelchair: Secondary | ICD-10-CM | POA: Diagnosis not present

## 2016-02-06 DIAGNOSIS — Z7982 Long term (current) use of aspirin: Secondary | ICD-10-CM | POA: Diagnosis not present

## 2016-02-06 DIAGNOSIS — J690 Pneumonitis due to inhalation of food and vomit: Secondary | ICD-10-CM | POA: Diagnosis not present

## 2016-02-08 DIAGNOSIS — J9612 Chronic respiratory failure with hypercapnia: Secondary | ICD-10-CM | POA: Diagnosis not present

## 2016-02-08 DIAGNOSIS — S91302D Unspecified open wound, left foot, subsequent encounter: Secondary | ICD-10-CM | POA: Diagnosis not present

## 2016-02-08 DIAGNOSIS — M1991 Primary osteoarthritis, unspecified site: Secondary | ICD-10-CM | POA: Diagnosis not present

## 2016-02-08 DIAGNOSIS — D649 Anemia, unspecified: Secondary | ICD-10-CM | POA: Diagnosis not present

## 2016-02-08 DIAGNOSIS — R251 Tremor, unspecified: Secondary | ICD-10-CM | POA: Diagnosis not present

## 2016-02-08 DIAGNOSIS — M25511 Pain in right shoulder: Secondary | ICD-10-CM | POA: Diagnosis not present

## 2016-02-08 DIAGNOSIS — J441 Chronic obstructive pulmonary disease with (acute) exacerbation: Secondary | ICD-10-CM | POA: Diagnosis not present

## 2016-02-08 DIAGNOSIS — G309 Alzheimer's disease, unspecified: Secondary | ICD-10-CM | POA: Diagnosis not present

## 2016-02-08 DIAGNOSIS — J690 Pneumonitis due to inhalation of food and vomit: Secondary | ICD-10-CM | POA: Diagnosis not present

## 2016-02-08 DIAGNOSIS — I10 Essential (primary) hypertension: Secondary | ICD-10-CM | POA: Diagnosis not present

## 2016-02-09 DIAGNOSIS — J9612 Chronic respiratory failure with hypercapnia: Secondary | ICD-10-CM | POA: Diagnosis not present

## 2016-02-09 DIAGNOSIS — R251 Tremor, unspecified: Secondary | ICD-10-CM | POA: Diagnosis not present

## 2016-02-09 DIAGNOSIS — M1991 Primary osteoarthritis, unspecified site: Secondary | ICD-10-CM | POA: Diagnosis not present

## 2016-02-09 DIAGNOSIS — M25511 Pain in right shoulder: Secondary | ICD-10-CM | POA: Diagnosis not present

## 2016-02-09 DIAGNOSIS — I10 Essential (primary) hypertension: Secondary | ICD-10-CM | POA: Diagnosis not present

## 2016-02-09 DIAGNOSIS — J690 Pneumonitis due to inhalation of food and vomit: Secondary | ICD-10-CM | POA: Diagnosis not present

## 2016-02-09 DIAGNOSIS — D649 Anemia, unspecified: Secondary | ICD-10-CM | POA: Diagnosis not present

## 2016-02-09 DIAGNOSIS — J441 Chronic obstructive pulmonary disease with (acute) exacerbation: Secondary | ICD-10-CM | POA: Diagnosis not present

## 2016-02-09 DIAGNOSIS — G309 Alzheimer's disease, unspecified: Secondary | ICD-10-CM | POA: Diagnosis not present

## 2016-02-09 DIAGNOSIS — S91302D Unspecified open wound, left foot, subsequent encounter: Secondary | ICD-10-CM | POA: Diagnosis not present

## 2016-02-11 DIAGNOSIS — J9612 Chronic respiratory failure with hypercapnia: Secondary | ICD-10-CM | POA: Diagnosis not present

## 2016-02-11 DIAGNOSIS — G309 Alzheimer's disease, unspecified: Secondary | ICD-10-CM | POA: Diagnosis not present

## 2016-02-11 DIAGNOSIS — D649 Anemia, unspecified: Secondary | ICD-10-CM | POA: Diagnosis not present

## 2016-02-11 DIAGNOSIS — J690 Pneumonitis due to inhalation of food and vomit: Secondary | ICD-10-CM | POA: Diagnosis not present

## 2016-02-11 DIAGNOSIS — M1991 Primary osteoarthritis, unspecified site: Secondary | ICD-10-CM | POA: Diagnosis not present

## 2016-02-11 DIAGNOSIS — S91302D Unspecified open wound, left foot, subsequent encounter: Secondary | ICD-10-CM | POA: Diagnosis not present

## 2016-02-11 DIAGNOSIS — R1310 Dysphagia, unspecified: Secondary | ICD-10-CM | POA: Diagnosis not present

## 2016-02-11 DIAGNOSIS — J441 Chronic obstructive pulmonary disease with (acute) exacerbation: Secondary | ICD-10-CM | POA: Diagnosis not present

## 2016-02-11 DIAGNOSIS — J439 Emphysema, unspecified: Secondary | ICD-10-CM | POA: Diagnosis not present

## 2016-02-11 DIAGNOSIS — J154 Pneumonia due to other streptococci: Secondary | ICD-10-CM | POA: Diagnosis not present

## 2016-02-11 DIAGNOSIS — I10 Essential (primary) hypertension: Secondary | ICD-10-CM | POA: Diagnosis not present

## 2016-02-11 DIAGNOSIS — Z79899 Other long term (current) drug therapy: Secondary | ICD-10-CM | POA: Diagnosis not present

## 2016-02-11 DIAGNOSIS — R251 Tremor, unspecified: Secondary | ICD-10-CM | POA: Diagnosis not present

## 2016-02-11 DIAGNOSIS — M25511 Pain in right shoulder: Secondary | ICD-10-CM | POA: Diagnosis not present

## 2016-02-12 DIAGNOSIS — I10 Essential (primary) hypertension: Secondary | ICD-10-CM | POA: Diagnosis not present

## 2016-02-12 DIAGNOSIS — R251 Tremor, unspecified: Secondary | ICD-10-CM | POA: Diagnosis not present

## 2016-02-12 DIAGNOSIS — G309 Alzheimer's disease, unspecified: Secondary | ICD-10-CM | POA: Diagnosis not present

## 2016-02-12 DIAGNOSIS — J9612 Chronic respiratory failure with hypercapnia: Secondary | ICD-10-CM | POA: Diagnosis not present

## 2016-02-12 DIAGNOSIS — M25511 Pain in right shoulder: Secondary | ICD-10-CM | POA: Diagnosis not present

## 2016-02-12 DIAGNOSIS — J690 Pneumonitis due to inhalation of food and vomit: Secondary | ICD-10-CM | POA: Diagnosis not present

## 2016-02-12 DIAGNOSIS — S91302D Unspecified open wound, left foot, subsequent encounter: Secondary | ICD-10-CM | POA: Diagnosis not present

## 2016-02-12 DIAGNOSIS — D649 Anemia, unspecified: Secondary | ICD-10-CM | POA: Diagnosis not present

## 2016-02-12 DIAGNOSIS — M1991 Primary osteoarthritis, unspecified site: Secondary | ICD-10-CM | POA: Diagnosis not present

## 2016-02-12 DIAGNOSIS — J441 Chronic obstructive pulmonary disease with (acute) exacerbation: Secondary | ICD-10-CM | POA: Diagnosis not present

## 2016-02-13 DIAGNOSIS — J441 Chronic obstructive pulmonary disease with (acute) exacerbation: Secondary | ICD-10-CM | POA: Diagnosis not present

## 2016-02-13 DIAGNOSIS — J9612 Chronic respiratory failure with hypercapnia: Secondary | ICD-10-CM | POA: Diagnosis not present

## 2016-02-13 DIAGNOSIS — I10 Essential (primary) hypertension: Secondary | ICD-10-CM | POA: Diagnosis not present

## 2016-02-13 DIAGNOSIS — S91302D Unspecified open wound, left foot, subsequent encounter: Secondary | ICD-10-CM | POA: Diagnosis not present

## 2016-02-13 DIAGNOSIS — M1991 Primary osteoarthritis, unspecified site: Secondary | ICD-10-CM | POA: Diagnosis not present

## 2016-02-13 DIAGNOSIS — R251 Tremor, unspecified: Secondary | ICD-10-CM | POA: Diagnosis not present

## 2016-02-13 DIAGNOSIS — J690 Pneumonitis due to inhalation of food and vomit: Secondary | ICD-10-CM | POA: Diagnosis not present

## 2016-02-13 DIAGNOSIS — D649 Anemia, unspecified: Secondary | ICD-10-CM | POA: Diagnosis not present

## 2016-02-13 DIAGNOSIS — M25511 Pain in right shoulder: Secondary | ICD-10-CM | POA: Diagnosis not present

## 2016-02-13 DIAGNOSIS — G309 Alzheimer's disease, unspecified: Secondary | ICD-10-CM | POA: Diagnosis not present

## 2016-02-16 DIAGNOSIS — S91302D Unspecified open wound, left foot, subsequent encounter: Secondary | ICD-10-CM | POA: Diagnosis not present

## 2016-02-16 DIAGNOSIS — J441 Chronic obstructive pulmonary disease with (acute) exacerbation: Secondary | ICD-10-CM | POA: Diagnosis not present

## 2016-02-16 DIAGNOSIS — M25511 Pain in right shoulder: Secondary | ICD-10-CM | POA: Diagnosis not present

## 2016-02-16 DIAGNOSIS — D649 Anemia, unspecified: Secondary | ICD-10-CM | POA: Diagnosis not present

## 2016-02-16 DIAGNOSIS — J9612 Chronic respiratory failure with hypercapnia: Secondary | ICD-10-CM | POA: Diagnosis not present

## 2016-02-16 DIAGNOSIS — R251 Tremor, unspecified: Secondary | ICD-10-CM | POA: Diagnosis not present

## 2016-02-16 DIAGNOSIS — I10 Essential (primary) hypertension: Secondary | ICD-10-CM | POA: Diagnosis not present

## 2016-02-16 DIAGNOSIS — M1991 Primary osteoarthritis, unspecified site: Secondary | ICD-10-CM | POA: Diagnosis not present

## 2016-02-16 DIAGNOSIS — G309 Alzheimer's disease, unspecified: Secondary | ICD-10-CM | POA: Diagnosis not present

## 2016-02-16 DIAGNOSIS — J690 Pneumonitis due to inhalation of food and vomit: Secondary | ICD-10-CM | POA: Diagnosis not present

## 2016-02-18 DIAGNOSIS — M25511 Pain in right shoulder: Secondary | ICD-10-CM | POA: Diagnosis not present

## 2016-02-18 DIAGNOSIS — M1991 Primary osteoarthritis, unspecified site: Secondary | ICD-10-CM | POA: Diagnosis not present

## 2016-02-18 DIAGNOSIS — G309 Alzheimer's disease, unspecified: Secondary | ICD-10-CM | POA: Diagnosis not present

## 2016-02-18 DIAGNOSIS — J9612 Chronic respiratory failure with hypercapnia: Secondary | ICD-10-CM | POA: Diagnosis not present

## 2016-02-18 DIAGNOSIS — I10 Essential (primary) hypertension: Secondary | ICD-10-CM | POA: Diagnosis not present

## 2016-02-18 DIAGNOSIS — J441 Chronic obstructive pulmonary disease with (acute) exacerbation: Secondary | ICD-10-CM | POA: Diagnosis not present

## 2016-02-18 DIAGNOSIS — D649 Anemia, unspecified: Secondary | ICD-10-CM | POA: Diagnosis not present

## 2016-02-18 DIAGNOSIS — R251 Tremor, unspecified: Secondary | ICD-10-CM | POA: Diagnosis not present

## 2016-02-18 DIAGNOSIS — S91302D Unspecified open wound, left foot, subsequent encounter: Secondary | ICD-10-CM | POA: Diagnosis not present

## 2016-02-18 DIAGNOSIS — J690 Pneumonitis due to inhalation of food and vomit: Secondary | ICD-10-CM | POA: Diagnosis not present

## 2016-02-20 DIAGNOSIS — G309 Alzheimer's disease, unspecified: Secondary | ICD-10-CM | POA: Diagnosis not present

## 2016-02-20 DIAGNOSIS — M1991 Primary osteoarthritis, unspecified site: Secondary | ICD-10-CM | POA: Diagnosis not present

## 2016-02-20 DIAGNOSIS — J690 Pneumonitis due to inhalation of food and vomit: Secondary | ICD-10-CM | POA: Diagnosis not present

## 2016-02-20 DIAGNOSIS — M25511 Pain in right shoulder: Secondary | ICD-10-CM | POA: Diagnosis not present

## 2016-02-20 DIAGNOSIS — R251 Tremor, unspecified: Secondary | ICD-10-CM | POA: Diagnosis not present

## 2016-02-20 DIAGNOSIS — I10 Essential (primary) hypertension: Secondary | ICD-10-CM | POA: Diagnosis not present

## 2016-02-20 DIAGNOSIS — S91302D Unspecified open wound, left foot, subsequent encounter: Secondary | ICD-10-CM | POA: Diagnosis not present

## 2016-02-20 DIAGNOSIS — J441 Chronic obstructive pulmonary disease with (acute) exacerbation: Secondary | ICD-10-CM | POA: Diagnosis not present

## 2016-02-20 DIAGNOSIS — D649 Anemia, unspecified: Secondary | ICD-10-CM | POA: Diagnosis not present

## 2016-02-20 DIAGNOSIS — J9612 Chronic respiratory failure with hypercapnia: Secondary | ICD-10-CM | POA: Diagnosis not present

## 2016-02-23 DIAGNOSIS — J690 Pneumonitis due to inhalation of food and vomit: Secondary | ICD-10-CM | POA: Diagnosis not present

## 2016-02-23 DIAGNOSIS — M25511 Pain in right shoulder: Secondary | ICD-10-CM | POA: Diagnosis not present

## 2016-02-23 DIAGNOSIS — G309 Alzheimer's disease, unspecified: Secondary | ICD-10-CM | POA: Diagnosis not present

## 2016-02-23 DIAGNOSIS — J9612 Chronic respiratory failure with hypercapnia: Secondary | ICD-10-CM | POA: Diagnosis not present

## 2016-02-23 DIAGNOSIS — J441 Chronic obstructive pulmonary disease with (acute) exacerbation: Secondary | ICD-10-CM | POA: Diagnosis not present

## 2016-02-23 DIAGNOSIS — S91302D Unspecified open wound, left foot, subsequent encounter: Secondary | ICD-10-CM | POA: Diagnosis not present

## 2016-02-23 DIAGNOSIS — M1991 Primary osteoarthritis, unspecified site: Secondary | ICD-10-CM | POA: Diagnosis not present

## 2016-02-23 DIAGNOSIS — D649 Anemia, unspecified: Secondary | ICD-10-CM | POA: Diagnosis not present

## 2016-02-23 DIAGNOSIS — I10 Essential (primary) hypertension: Secondary | ICD-10-CM | POA: Diagnosis not present

## 2016-02-23 DIAGNOSIS — R251 Tremor, unspecified: Secondary | ICD-10-CM | POA: Diagnosis not present

## 2016-02-24 DIAGNOSIS — D649 Anemia, unspecified: Secondary | ICD-10-CM | POA: Diagnosis not present

## 2016-02-24 DIAGNOSIS — I10 Essential (primary) hypertension: Secondary | ICD-10-CM | POA: Diagnosis not present

## 2016-02-24 DIAGNOSIS — J9612 Chronic respiratory failure with hypercapnia: Secondary | ICD-10-CM | POA: Diagnosis not present

## 2016-02-24 DIAGNOSIS — G309 Alzheimer's disease, unspecified: Secondary | ICD-10-CM | POA: Diagnosis not present

## 2016-02-24 DIAGNOSIS — R251 Tremor, unspecified: Secondary | ICD-10-CM | POA: Diagnosis not present

## 2016-02-24 DIAGNOSIS — J441 Chronic obstructive pulmonary disease with (acute) exacerbation: Secondary | ICD-10-CM | POA: Diagnosis not present

## 2016-02-24 DIAGNOSIS — M1991 Primary osteoarthritis, unspecified site: Secondary | ICD-10-CM | POA: Diagnosis not present

## 2016-02-24 DIAGNOSIS — J690 Pneumonitis due to inhalation of food and vomit: Secondary | ICD-10-CM | POA: Diagnosis not present

## 2016-02-24 DIAGNOSIS — S91302D Unspecified open wound, left foot, subsequent encounter: Secondary | ICD-10-CM | POA: Diagnosis not present

## 2016-02-24 DIAGNOSIS — M25511 Pain in right shoulder: Secondary | ICD-10-CM | POA: Diagnosis not present

## 2016-02-25 DIAGNOSIS — S91302D Unspecified open wound, left foot, subsequent encounter: Secondary | ICD-10-CM | POA: Diagnosis not present

## 2016-02-25 DIAGNOSIS — M1991 Primary osteoarthritis, unspecified site: Secondary | ICD-10-CM | POA: Diagnosis not present

## 2016-02-25 DIAGNOSIS — I10 Essential (primary) hypertension: Secondary | ICD-10-CM | POA: Diagnosis not present

## 2016-02-25 DIAGNOSIS — J441 Chronic obstructive pulmonary disease with (acute) exacerbation: Secondary | ICD-10-CM | POA: Diagnosis not present

## 2016-02-25 DIAGNOSIS — J449 Chronic obstructive pulmonary disease, unspecified: Secondary | ICD-10-CM | POA: Diagnosis not present

## 2016-02-25 DIAGNOSIS — G309 Alzheimer's disease, unspecified: Secondary | ICD-10-CM | POA: Diagnosis not present

## 2016-02-25 DIAGNOSIS — J690 Pneumonitis due to inhalation of food and vomit: Secondary | ICD-10-CM | POA: Diagnosis not present

## 2016-02-25 DIAGNOSIS — D649 Anemia, unspecified: Secondary | ICD-10-CM | POA: Diagnosis not present

## 2016-02-25 DIAGNOSIS — R251 Tremor, unspecified: Secondary | ICD-10-CM | POA: Diagnosis not present

## 2016-02-25 DIAGNOSIS — J9612 Chronic respiratory failure with hypercapnia: Secondary | ICD-10-CM | POA: Diagnosis not present

## 2016-02-25 DIAGNOSIS — M25511 Pain in right shoulder: Secondary | ICD-10-CM | POA: Diagnosis not present

## 2016-02-27 DIAGNOSIS — S91302D Unspecified open wound, left foot, subsequent encounter: Secondary | ICD-10-CM | POA: Diagnosis not present

## 2016-02-27 DIAGNOSIS — I10 Essential (primary) hypertension: Secondary | ICD-10-CM | POA: Diagnosis not present

## 2016-02-27 DIAGNOSIS — J441 Chronic obstructive pulmonary disease with (acute) exacerbation: Secondary | ICD-10-CM | POA: Diagnosis not present

## 2016-02-27 DIAGNOSIS — G309 Alzheimer's disease, unspecified: Secondary | ICD-10-CM | POA: Diagnosis not present

## 2016-02-27 DIAGNOSIS — D649 Anemia, unspecified: Secondary | ICD-10-CM | POA: Diagnosis not present

## 2016-02-27 DIAGNOSIS — J690 Pneumonitis due to inhalation of food and vomit: Secondary | ICD-10-CM | POA: Diagnosis not present

## 2016-02-27 DIAGNOSIS — R251 Tremor, unspecified: Secondary | ICD-10-CM | POA: Diagnosis not present

## 2016-02-27 DIAGNOSIS — M1991 Primary osteoarthritis, unspecified site: Secondary | ICD-10-CM | POA: Diagnosis not present

## 2016-02-27 DIAGNOSIS — M25511 Pain in right shoulder: Secondary | ICD-10-CM | POA: Diagnosis not present

## 2016-02-27 DIAGNOSIS — J9612 Chronic respiratory failure with hypercapnia: Secondary | ICD-10-CM | POA: Diagnosis not present

## 2016-03-01 DIAGNOSIS — R251 Tremor, unspecified: Secondary | ICD-10-CM | POA: Diagnosis not present

## 2016-03-01 DIAGNOSIS — J441 Chronic obstructive pulmonary disease with (acute) exacerbation: Secondary | ICD-10-CM | POA: Diagnosis not present

## 2016-03-01 DIAGNOSIS — J9612 Chronic respiratory failure with hypercapnia: Secondary | ICD-10-CM | POA: Diagnosis not present

## 2016-03-01 DIAGNOSIS — I10 Essential (primary) hypertension: Secondary | ICD-10-CM | POA: Diagnosis not present

## 2016-03-01 DIAGNOSIS — J690 Pneumonitis due to inhalation of food and vomit: Secondary | ICD-10-CM | POA: Diagnosis not present

## 2016-03-01 DIAGNOSIS — S91302D Unspecified open wound, left foot, subsequent encounter: Secondary | ICD-10-CM | POA: Diagnosis not present

## 2016-03-01 DIAGNOSIS — M25511 Pain in right shoulder: Secondary | ICD-10-CM | POA: Diagnosis not present

## 2016-03-01 DIAGNOSIS — D649 Anemia, unspecified: Secondary | ICD-10-CM | POA: Diagnosis not present

## 2016-03-01 DIAGNOSIS — M1991 Primary osteoarthritis, unspecified site: Secondary | ICD-10-CM | POA: Diagnosis not present

## 2016-03-01 DIAGNOSIS — G309 Alzheimer's disease, unspecified: Secondary | ICD-10-CM | POA: Diagnosis not present

## 2016-03-03 DIAGNOSIS — D649 Anemia, unspecified: Secondary | ICD-10-CM | POA: Diagnosis not present

## 2016-03-03 DIAGNOSIS — M25511 Pain in right shoulder: Secondary | ICD-10-CM | POA: Diagnosis not present

## 2016-03-03 DIAGNOSIS — I10 Essential (primary) hypertension: Secondary | ICD-10-CM | POA: Diagnosis not present

## 2016-03-03 DIAGNOSIS — S91302D Unspecified open wound, left foot, subsequent encounter: Secondary | ICD-10-CM | POA: Diagnosis not present

## 2016-03-03 DIAGNOSIS — J441 Chronic obstructive pulmonary disease with (acute) exacerbation: Secondary | ICD-10-CM | POA: Diagnosis not present

## 2016-03-03 DIAGNOSIS — G309 Alzheimer's disease, unspecified: Secondary | ICD-10-CM | POA: Diagnosis not present

## 2016-03-03 DIAGNOSIS — M1991 Primary osteoarthritis, unspecified site: Secondary | ICD-10-CM | POA: Diagnosis not present

## 2016-03-03 DIAGNOSIS — J690 Pneumonitis due to inhalation of food and vomit: Secondary | ICD-10-CM | POA: Diagnosis not present

## 2016-03-03 DIAGNOSIS — J9612 Chronic respiratory failure with hypercapnia: Secondary | ICD-10-CM | POA: Diagnosis not present

## 2016-03-03 DIAGNOSIS — R251 Tremor, unspecified: Secondary | ICD-10-CM | POA: Diagnosis not present

## 2016-03-04 DIAGNOSIS — R251 Tremor, unspecified: Secondary | ICD-10-CM | POA: Diagnosis not present

## 2016-03-04 DIAGNOSIS — D649 Anemia, unspecified: Secondary | ICD-10-CM | POA: Diagnosis not present

## 2016-03-04 DIAGNOSIS — J441 Chronic obstructive pulmonary disease with (acute) exacerbation: Secondary | ICD-10-CM | POA: Diagnosis not present

## 2016-03-04 DIAGNOSIS — G309 Alzheimer's disease, unspecified: Secondary | ICD-10-CM | POA: Diagnosis not present

## 2016-03-04 DIAGNOSIS — M1991 Primary osteoarthritis, unspecified site: Secondary | ICD-10-CM | POA: Diagnosis not present

## 2016-03-04 DIAGNOSIS — I10 Essential (primary) hypertension: Secondary | ICD-10-CM | POA: Diagnosis not present

## 2016-03-04 DIAGNOSIS — M25511 Pain in right shoulder: Secondary | ICD-10-CM | POA: Diagnosis not present

## 2016-03-04 DIAGNOSIS — S91302D Unspecified open wound, left foot, subsequent encounter: Secondary | ICD-10-CM | POA: Diagnosis not present

## 2016-03-04 DIAGNOSIS — J9612 Chronic respiratory failure with hypercapnia: Secondary | ICD-10-CM | POA: Diagnosis not present

## 2016-03-04 DIAGNOSIS — J690 Pneumonitis due to inhalation of food and vomit: Secondary | ICD-10-CM | POA: Diagnosis not present

## 2016-03-05 DIAGNOSIS — J441 Chronic obstructive pulmonary disease with (acute) exacerbation: Secondary | ICD-10-CM | POA: Diagnosis not present

## 2016-03-05 DIAGNOSIS — G309 Alzheimer's disease, unspecified: Secondary | ICD-10-CM | POA: Diagnosis not present

## 2016-03-05 DIAGNOSIS — S91302D Unspecified open wound, left foot, subsequent encounter: Secondary | ICD-10-CM | POA: Diagnosis not present

## 2016-03-05 DIAGNOSIS — J9612 Chronic respiratory failure with hypercapnia: Secondary | ICD-10-CM | POA: Diagnosis not present

## 2016-03-05 DIAGNOSIS — D649 Anemia, unspecified: Secondary | ICD-10-CM | POA: Diagnosis not present

## 2016-03-05 DIAGNOSIS — M1991 Primary osteoarthritis, unspecified site: Secondary | ICD-10-CM | POA: Diagnosis not present

## 2016-03-05 DIAGNOSIS — M25511 Pain in right shoulder: Secondary | ICD-10-CM | POA: Diagnosis not present

## 2016-03-05 DIAGNOSIS — J690 Pneumonitis due to inhalation of food and vomit: Secondary | ICD-10-CM | POA: Diagnosis not present

## 2016-03-05 DIAGNOSIS — R251 Tremor, unspecified: Secondary | ICD-10-CM | POA: Diagnosis not present

## 2016-03-05 DIAGNOSIS — I10 Essential (primary) hypertension: Secondary | ICD-10-CM | POA: Diagnosis not present

## 2016-03-08 DIAGNOSIS — M25511 Pain in right shoulder: Secondary | ICD-10-CM | POA: Diagnosis not present

## 2016-03-08 DIAGNOSIS — I10 Essential (primary) hypertension: Secondary | ICD-10-CM | POA: Diagnosis not present

## 2016-03-08 DIAGNOSIS — G309 Alzheimer's disease, unspecified: Secondary | ICD-10-CM | POA: Diagnosis not present

## 2016-03-08 DIAGNOSIS — J690 Pneumonitis due to inhalation of food and vomit: Secondary | ICD-10-CM | POA: Diagnosis not present

## 2016-03-08 DIAGNOSIS — J9612 Chronic respiratory failure with hypercapnia: Secondary | ICD-10-CM | POA: Diagnosis not present

## 2016-03-08 DIAGNOSIS — D649 Anemia, unspecified: Secondary | ICD-10-CM | POA: Diagnosis not present

## 2016-03-08 DIAGNOSIS — R251 Tremor, unspecified: Secondary | ICD-10-CM | POA: Diagnosis not present

## 2016-03-08 DIAGNOSIS — M1991 Primary osteoarthritis, unspecified site: Secondary | ICD-10-CM | POA: Diagnosis not present

## 2016-03-08 DIAGNOSIS — S91302D Unspecified open wound, left foot, subsequent encounter: Secondary | ICD-10-CM | POA: Diagnosis not present

## 2016-03-08 DIAGNOSIS — J441 Chronic obstructive pulmonary disease with (acute) exacerbation: Secondary | ICD-10-CM | POA: Diagnosis not present

## 2016-03-09 DIAGNOSIS — R251 Tremor, unspecified: Secondary | ICD-10-CM | POA: Diagnosis not present

## 2016-03-09 DIAGNOSIS — M1991 Primary osteoarthritis, unspecified site: Secondary | ICD-10-CM | POA: Diagnosis not present

## 2016-03-09 DIAGNOSIS — S91302D Unspecified open wound, left foot, subsequent encounter: Secondary | ICD-10-CM | POA: Diagnosis not present

## 2016-03-09 DIAGNOSIS — D649 Anemia, unspecified: Secondary | ICD-10-CM | POA: Diagnosis not present

## 2016-03-09 DIAGNOSIS — J441 Chronic obstructive pulmonary disease with (acute) exacerbation: Secondary | ICD-10-CM | POA: Diagnosis not present

## 2016-03-09 DIAGNOSIS — M25511 Pain in right shoulder: Secondary | ICD-10-CM | POA: Diagnosis not present

## 2016-03-09 DIAGNOSIS — G309 Alzheimer's disease, unspecified: Secondary | ICD-10-CM | POA: Diagnosis not present

## 2016-03-09 DIAGNOSIS — J690 Pneumonitis due to inhalation of food and vomit: Secondary | ICD-10-CM | POA: Diagnosis not present

## 2016-03-09 DIAGNOSIS — I10 Essential (primary) hypertension: Secondary | ICD-10-CM | POA: Diagnosis not present

## 2016-03-09 DIAGNOSIS — J9612 Chronic respiratory failure with hypercapnia: Secondary | ICD-10-CM | POA: Diagnosis not present

## 2016-03-10 DIAGNOSIS — G309 Alzheimer's disease, unspecified: Secondary | ICD-10-CM | POA: Diagnosis not present

## 2016-03-10 DIAGNOSIS — J690 Pneumonitis due to inhalation of food and vomit: Secondary | ICD-10-CM | POA: Diagnosis not present

## 2016-03-10 DIAGNOSIS — M25511 Pain in right shoulder: Secondary | ICD-10-CM | POA: Diagnosis not present

## 2016-03-10 DIAGNOSIS — I10 Essential (primary) hypertension: Secondary | ICD-10-CM | POA: Diagnosis not present

## 2016-03-10 DIAGNOSIS — M1991 Primary osteoarthritis, unspecified site: Secondary | ICD-10-CM | POA: Diagnosis not present

## 2016-03-10 DIAGNOSIS — R251 Tremor, unspecified: Secondary | ICD-10-CM | POA: Diagnosis not present

## 2016-03-10 DIAGNOSIS — J441 Chronic obstructive pulmonary disease with (acute) exacerbation: Secondary | ICD-10-CM | POA: Diagnosis not present

## 2016-03-10 DIAGNOSIS — D649 Anemia, unspecified: Secondary | ICD-10-CM | POA: Diagnosis not present

## 2016-03-10 DIAGNOSIS — J9612 Chronic respiratory failure with hypercapnia: Secondary | ICD-10-CM | POA: Diagnosis not present

## 2016-03-10 DIAGNOSIS — S91302D Unspecified open wound, left foot, subsequent encounter: Secondary | ICD-10-CM | POA: Diagnosis not present

## 2016-03-11 DIAGNOSIS — J690 Pneumonitis due to inhalation of food and vomit: Secondary | ICD-10-CM | POA: Diagnosis not present

## 2016-03-11 DIAGNOSIS — J441 Chronic obstructive pulmonary disease with (acute) exacerbation: Secondary | ICD-10-CM | POA: Diagnosis not present

## 2016-03-11 DIAGNOSIS — D649 Anemia, unspecified: Secondary | ICD-10-CM | POA: Diagnosis not present

## 2016-03-11 DIAGNOSIS — M1991 Primary osteoarthritis, unspecified site: Secondary | ICD-10-CM | POA: Diagnosis not present

## 2016-03-11 DIAGNOSIS — G309 Alzheimer's disease, unspecified: Secondary | ICD-10-CM | POA: Diagnosis not present

## 2016-03-11 DIAGNOSIS — S91302D Unspecified open wound, left foot, subsequent encounter: Secondary | ICD-10-CM | POA: Diagnosis not present

## 2016-03-11 DIAGNOSIS — R251 Tremor, unspecified: Secondary | ICD-10-CM | POA: Diagnosis not present

## 2016-03-11 DIAGNOSIS — M25511 Pain in right shoulder: Secondary | ICD-10-CM | POA: Diagnosis not present

## 2016-03-11 DIAGNOSIS — J9612 Chronic respiratory failure with hypercapnia: Secondary | ICD-10-CM | POA: Diagnosis not present

## 2016-03-11 DIAGNOSIS — I10 Essential (primary) hypertension: Secondary | ICD-10-CM | POA: Diagnosis not present

## 2016-03-12 DIAGNOSIS — M1991 Primary osteoarthritis, unspecified site: Secondary | ICD-10-CM | POA: Diagnosis not present

## 2016-03-12 DIAGNOSIS — J690 Pneumonitis due to inhalation of food and vomit: Secondary | ICD-10-CM | POA: Diagnosis not present

## 2016-03-12 DIAGNOSIS — S91302D Unspecified open wound, left foot, subsequent encounter: Secondary | ICD-10-CM | POA: Diagnosis not present

## 2016-03-12 DIAGNOSIS — D649 Anemia, unspecified: Secondary | ICD-10-CM | POA: Diagnosis not present

## 2016-03-12 DIAGNOSIS — M25511 Pain in right shoulder: Secondary | ICD-10-CM | POA: Diagnosis not present

## 2016-03-12 DIAGNOSIS — J441 Chronic obstructive pulmonary disease with (acute) exacerbation: Secondary | ICD-10-CM | POA: Diagnosis not present

## 2016-03-12 DIAGNOSIS — I10 Essential (primary) hypertension: Secondary | ICD-10-CM | POA: Diagnosis not present

## 2016-03-12 DIAGNOSIS — J9612 Chronic respiratory failure with hypercapnia: Secondary | ICD-10-CM | POA: Diagnosis not present

## 2016-03-12 DIAGNOSIS — G309 Alzheimer's disease, unspecified: Secondary | ICD-10-CM | POA: Diagnosis not present

## 2016-03-12 DIAGNOSIS — R251 Tremor, unspecified: Secondary | ICD-10-CM | POA: Diagnosis not present

## 2016-03-15 DIAGNOSIS — M1991 Primary osteoarthritis, unspecified site: Secondary | ICD-10-CM | POA: Diagnosis not present

## 2016-03-15 DIAGNOSIS — J441 Chronic obstructive pulmonary disease with (acute) exacerbation: Secondary | ICD-10-CM | POA: Diagnosis not present

## 2016-03-15 DIAGNOSIS — J690 Pneumonitis due to inhalation of food and vomit: Secondary | ICD-10-CM | POA: Diagnosis not present

## 2016-03-15 DIAGNOSIS — I10 Essential (primary) hypertension: Secondary | ICD-10-CM | POA: Diagnosis not present

## 2016-03-15 DIAGNOSIS — G309 Alzheimer's disease, unspecified: Secondary | ICD-10-CM | POA: Diagnosis not present

## 2016-03-15 DIAGNOSIS — S91302D Unspecified open wound, left foot, subsequent encounter: Secondary | ICD-10-CM | POA: Diagnosis not present

## 2016-03-15 DIAGNOSIS — M25511 Pain in right shoulder: Secondary | ICD-10-CM | POA: Diagnosis not present

## 2016-03-15 DIAGNOSIS — D649 Anemia, unspecified: Secondary | ICD-10-CM | POA: Diagnosis not present

## 2016-03-15 DIAGNOSIS — R251 Tremor, unspecified: Secondary | ICD-10-CM | POA: Diagnosis not present

## 2016-03-15 DIAGNOSIS — J9612 Chronic respiratory failure with hypercapnia: Secondary | ICD-10-CM | POA: Diagnosis not present

## 2016-03-16 DIAGNOSIS — R0602 Shortness of breath: Secondary | ICD-10-CM | POA: Diagnosis not present

## 2016-03-16 DIAGNOSIS — I10 Essential (primary) hypertension: Secondary | ICD-10-CM | POA: Diagnosis not present

## 2016-03-16 DIAGNOSIS — J441 Chronic obstructive pulmonary disease with (acute) exacerbation: Secondary | ICD-10-CM | POA: Diagnosis not present

## 2016-03-16 DIAGNOSIS — J690 Pneumonitis due to inhalation of food and vomit: Secondary | ICD-10-CM | POA: Diagnosis not present

## 2016-03-16 DIAGNOSIS — J9612 Chronic respiratory failure with hypercapnia: Secondary | ICD-10-CM | POA: Diagnosis not present

## 2016-03-17 DIAGNOSIS — R251 Tremor, unspecified: Secondary | ICD-10-CM | POA: Diagnosis not present

## 2016-03-17 DIAGNOSIS — G309 Alzheimer's disease, unspecified: Secondary | ICD-10-CM | POA: Diagnosis not present

## 2016-03-17 DIAGNOSIS — M1991 Primary osteoarthritis, unspecified site: Secondary | ICD-10-CM | POA: Diagnosis not present

## 2016-03-17 DIAGNOSIS — J441 Chronic obstructive pulmonary disease with (acute) exacerbation: Secondary | ICD-10-CM | POA: Diagnosis not present

## 2016-03-17 DIAGNOSIS — J9612 Chronic respiratory failure with hypercapnia: Secondary | ICD-10-CM | POA: Diagnosis not present

## 2016-03-17 DIAGNOSIS — D649 Anemia, unspecified: Secondary | ICD-10-CM | POA: Diagnosis not present

## 2016-03-17 DIAGNOSIS — J690 Pneumonitis due to inhalation of food and vomit: Secondary | ICD-10-CM | POA: Diagnosis not present

## 2016-03-17 DIAGNOSIS — S91302D Unspecified open wound, left foot, subsequent encounter: Secondary | ICD-10-CM | POA: Diagnosis not present

## 2016-03-17 DIAGNOSIS — I10 Essential (primary) hypertension: Secondary | ICD-10-CM | POA: Diagnosis not present

## 2016-03-17 DIAGNOSIS — M25511 Pain in right shoulder: Secondary | ICD-10-CM | POA: Diagnosis not present

## 2016-03-19 DIAGNOSIS — M25511 Pain in right shoulder: Secondary | ICD-10-CM | POA: Diagnosis not present

## 2016-03-19 DIAGNOSIS — M1991 Primary osteoarthritis, unspecified site: Secondary | ICD-10-CM | POA: Diagnosis not present

## 2016-03-19 DIAGNOSIS — J441 Chronic obstructive pulmonary disease with (acute) exacerbation: Secondary | ICD-10-CM | POA: Diagnosis not present

## 2016-03-19 DIAGNOSIS — D649 Anemia, unspecified: Secondary | ICD-10-CM | POA: Diagnosis not present

## 2016-03-19 DIAGNOSIS — I10 Essential (primary) hypertension: Secondary | ICD-10-CM | POA: Diagnosis not present

## 2016-03-19 DIAGNOSIS — G309 Alzheimer's disease, unspecified: Secondary | ICD-10-CM | POA: Diagnosis not present

## 2016-03-19 DIAGNOSIS — S91302D Unspecified open wound, left foot, subsequent encounter: Secondary | ICD-10-CM | POA: Diagnosis not present

## 2016-03-19 DIAGNOSIS — J690 Pneumonitis due to inhalation of food and vomit: Secondary | ICD-10-CM | POA: Diagnosis not present

## 2016-03-19 DIAGNOSIS — J9612 Chronic respiratory failure with hypercapnia: Secondary | ICD-10-CM | POA: Diagnosis not present

## 2016-03-19 DIAGNOSIS — R251 Tremor, unspecified: Secondary | ICD-10-CM | POA: Diagnosis not present

## 2016-03-22 DIAGNOSIS — J690 Pneumonitis due to inhalation of food and vomit: Secondary | ICD-10-CM | POA: Diagnosis not present

## 2016-03-22 DIAGNOSIS — J441 Chronic obstructive pulmonary disease with (acute) exacerbation: Secondary | ICD-10-CM | POA: Diagnosis not present

## 2016-03-22 DIAGNOSIS — R251 Tremor, unspecified: Secondary | ICD-10-CM | POA: Diagnosis not present

## 2016-03-22 DIAGNOSIS — M25511 Pain in right shoulder: Secondary | ICD-10-CM | POA: Diagnosis not present

## 2016-03-22 DIAGNOSIS — G309 Alzheimer's disease, unspecified: Secondary | ICD-10-CM | POA: Diagnosis not present

## 2016-03-22 DIAGNOSIS — J9612 Chronic respiratory failure with hypercapnia: Secondary | ICD-10-CM | POA: Diagnosis not present

## 2016-03-22 DIAGNOSIS — S91302D Unspecified open wound, left foot, subsequent encounter: Secondary | ICD-10-CM | POA: Diagnosis not present

## 2016-03-22 DIAGNOSIS — M1991 Primary osteoarthritis, unspecified site: Secondary | ICD-10-CM | POA: Diagnosis not present

## 2016-03-22 DIAGNOSIS — D649 Anemia, unspecified: Secondary | ICD-10-CM | POA: Diagnosis not present

## 2016-03-22 DIAGNOSIS — I10 Essential (primary) hypertension: Secondary | ICD-10-CM | POA: Diagnosis not present

## 2016-03-24 DIAGNOSIS — J9612 Chronic respiratory failure with hypercapnia: Secondary | ICD-10-CM | POA: Diagnosis not present

## 2016-03-24 DIAGNOSIS — I10 Essential (primary) hypertension: Secondary | ICD-10-CM | POA: Diagnosis not present

## 2016-03-24 DIAGNOSIS — D649 Anemia, unspecified: Secondary | ICD-10-CM | POA: Diagnosis not present

## 2016-03-24 DIAGNOSIS — R251 Tremor, unspecified: Secondary | ICD-10-CM | POA: Diagnosis not present

## 2016-03-24 DIAGNOSIS — M25511 Pain in right shoulder: Secondary | ICD-10-CM | POA: Diagnosis not present

## 2016-03-24 DIAGNOSIS — J690 Pneumonitis due to inhalation of food and vomit: Secondary | ICD-10-CM | POA: Diagnosis not present

## 2016-03-24 DIAGNOSIS — S91302D Unspecified open wound, left foot, subsequent encounter: Secondary | ICD-10-CM | POA: Diagnosis not present

## 2016-03-24 DIAGNOSIS — M1991 Primary osteoarthritis, unspecified site: Secondary | ICD-10-CM | POA: Diagnosis not present

## 2016-03-24 DIAGNOSIS — J441 Chronic obstructive pulmonary disease with (acute) exacerbation: Secondary | ICD-10-CM | POA: Diagnosis not present

## 2016-03-24 DIAGNOSIS — G309 Alzheimer's disease, unspecified: Secondary | ICD-10-CM | POA: Diagnosis not present

## 2016-03-25 DIAGNOSIS — K649 Unspecified hemorrhoids: Secondary | ICD-10-CM | POA: Diagnosis not present

## 2016-03-25 DIAGNOSIS — R918 Other nonspecific abnormal finding of lung field: Secondary | ICD-10-CM | POA: Diagnosis not present

## 2016-03-25 DIAGNOSIS — I10 Essential (primary) hypertension: Secondary | ICD-10-CM | POA: Diagnosis not present

## 2016-03-25 DIAGNOSIS — M1990 Unspecified osteoarthritis, unspecified site: Secondary | ICD-10-CM | POA: Diagnosis not present

## 2016-03-25 DIAGNOSIS — J449 Chronic obstructive pulmonary disease, unspecified: Secondary | ICD-10-CM | POA: Diagnosis not present

## 2016-03-26 DIAGNOSIS — S91302D Unspecified open wound, left foot, subsequent encounter: Secondary | ICD-10-CM | POA: Diagnosis not present

## 2016-03-26 DIAGNOSIS — M1991 Primary osteoarthritis, unspecified site: Secondary | ICD-10-CM | POA: Diagnosis not present

## 2016-03-26 DIAGNOSIS — J690 Pneumonitis due to inhalation of food and vomit: Secondary | ICD-10-CM | POA: Diagnosis not present

## 2016-03-26 DIAGNOSIS — G309 Alzheimer's disease, unspecified: Secondary | ICD-10-CM | POA: Diagnosis not present

## 2016-03-26 DIAGNOSIS — I10 Essential (primary) hypertension: Secondary | ICD-10-CM | POA: Diagnosis not present

## 2016-03-26 DIAGNOSIS — J441 Chronic obstructive pulmonary disease with (acute) exacerbation: Secondary | ICD-10-CM | POA: Diagnosis not present

## 2016-03-26 DIAGNOSIS — E039 Hypothyroidism, unspecified: Secondary | ICD-10-CM | POA: Diagnosis not present

## 2016-03-26 DIAGNOSIS — J9612 Chronic respiratory failure with hypercapnia: Secondary | ICD-10-CM | POA: Diagnosis not present

## 2016-03-26 DIAGNOSIS — D649 Anemia, unspecified: Secondary | ICD-10-CM | POA: Diagnosis not present

## 2016-03-26 DIAGNOSIS — E785 Hyperlipidemia, unspecified: Secondary | ICD-10-CM | POA: Diagnosis not present

## 2016-03-26 DIAGNOSIS — R251 Tremor, unspecified: Secondary | ICD-10-CM | POA: Diagnosis not present

## 2016-03-26 DIAGNOSIS — Z79899 Other long term (current) drug therapy: Secondary | ICD-10-CM | POA: Diagnosis not present

## 2016-03-26 DIAGNOSIS — M25511 Pain in right shoulder: Secondary | ICD-10-CM | POA: Diagnosis not present

## 2016-03-27 DIAGNOSIS — J449 Chronic obstructive pulmonary disease, unspecified: Secondary | ICD-10-CM | POA: Diagnosis not present

## 2016-03-29 DIAGNOSIS — J441 Chronic obstructive pulmonary disease with (acute) exacerbation: Secondary | ICD-10-CM | POA: Diagnosis not present

## 2016-03-29 DIAGNOSIS — S91302D Unspecified open wound, left foot, subsequent encounter: Secondary | ICD-10-CM | POA: Diagnosis not present

## 2016-03-29 DIAGNOSIS — D649 Anemia, unspecified: Secondary | ICD-10-CM | POA: Diagnosis not present

## 2016-03-29 DIAGNOSIS — R251 Tremor, unspecified: Secondary | ICD-10-CM | POA: Diagnosis not present

## 2016-03-29 DIAGNOSIS — J9612 Chronic respiratory failure with hypercapnia: Secondary | ICD-10-CM | POA: Diagnosis not present

## 2016-03-29 DIAGNOSIS — M1991 Primary osteoarthritis, unspecified site: Secondary | ICD-10-CM | POA: Diagnosis not present

## 2016-03-29 DIAGNOSIS — M25511 Pain in right shoulder: Secondary | ICD-10-CM | POA: Diagnosis not present

## 2016-03-29 DIAGNOSIS — G309 Alzheimer's disease, unspecified: Secondary | ICD-10-CM | POA: Diagnosis not present

## 2016-03-29 DIAGNOSIS — I10 Essential (primary) hypertension: Secondary | ICD-10-CM | POA: Diagnosis not present

## 2016-03-29 DIAGNOSIS — J690 Pneumonitis due to inhalation of food and vomit: Secondary | ICD-10-CM | POA: Diagnosis not present

## 2016-03-31 DIAGNOSIS — D649 Anemia, unspecified: Secondary | ICD-10-CM | POA: Diagnosis not present

## 2016-03-31 DIAGNOSIS — J690 Pneumonitis due to inhalation of food and vomit: Secondary | ICD-10-CM | POA: Diagnosis not present

## 2016-03-31 DIAGNOSIS — R251 Tremor, unspecified: Secondary | ICD-10-CM | POA: Diagnosis not present

## 2016-03-31 DIAGNOSIS — G309 Alzheimer's disease, unspecified: Secondary | ICD-10-CM | POA: Diagnosis not present

## 2016-03-31 DIAGNOSIS — J441 Chronic obstructive pulmonary disease with (acute) exacerbation: Secondary | ICD-10-CM | POA: Diagnosis not present

## 2016-03-31 DIAGNOSIS — M1991 Primary osteoarthritis, unspecified site: Secondary | ICD-10-CM | POA: Diagnosis not present

## 2016-03-31 DIAGNOSIS — I10 Essential (primary) hypertension: Secondary | ICD-10-CM | POA: Diagnosis not present

## 2016-03-31 DIAGNOSIS — J9612 Chronic respiratory failure with hypercapnia: Secondary | ICD-10-CM | POA: Diagnosis not present

## 2016-03-31 DIAGNOSIS — S91302D Unspecified open wound, left foot, subsequent encounter: Secondary | ICD-10-CM | POA: Diagnosis not present

## 2016-03-31 DIAGNOSIS — M25511 Pain in right shoulder: Secondary | ICD-10-CM | POA: Diagnosis not present

## 2016-04-02 DIAGNOSIS — I10 Essential (primary) hypertension: Secondary | ICD-10-CM | POA: Diagnosis not present

## 2016-04-02 DIAGNOSIS — D649 Anemia, unspecified: Secondary | ICD-10-CM | POA: Diagnosis not present

## 2016-04-02 DIAGNOSIS — J690 Pneumonitis due to inhalation of food and vomit: Secondary | ICD-10-CM | POA: Diagnosis not present

## 2016-04-02 DIAGNOSIS — J9612 Chronic respiratory failure with hypercapnia: Secondary | ICD-10-CM | POA: Diagnosis not present

## 2016-04-02 DIAGNOSIS — M1991 Primary osteoarthritis, unspecified site: Secondary | ICD-10-CM | POA: Diagnosis not present

## 2016-04-02 DIAGNOSIS — R251 Tremor, unspecified: Secondary | ICD-10-CM | POA: Diagnosis not present

## 2016-04-02 DIAGNOSIS — J441 Chronic obstructive pulmonary disease with (acute) exacerbation: Secondary | ICD-10-CM | POA: Diagnosis not present

## 2016-04-02 DIAGNOSIS — M25511 Pain in right shoulder: Secondary | ICD-10-CM | POA: Diagnosis not present

## 2016-04-02 DIAGNOSIS — G309 Alzheimer's disease, unspecified: Secondary | ICD-10-CM | POA: Diagnosis not present

## 2016-04-02 DIAGNOSIS — S91302D Unspecified open wound, left foot, subsequent encounter: Secondary | ICD-10-CM | POA: Diagnosis not present

## 2016-04-05 DIAGNOSIS — J441 Chronic obstructive pulmonary disease with (acute) exacerbation: Secondary | ICD-10-CM | POA: Diagnosis not present

## 2016-04-05 DIAGNOSIS — S91302D Unspecified open wound, left foot, subsequent encounter: Secondary | ICD-10-CM | POA: Diagnosis not present

## 2016-04-05 DIAGNOSIS — J690 Pneumonitis due to inhalation of food and vomit: Secondary | ICD-10-CM | POA: Diagnosis not present

## 2016-04-05 DIAGNOSIS — M1991 Primary osteoarthritis, unspecified site: Secondary | ICD-10-CM | POA: Diagnosis not present

## 2016-04-05 DIAGNOSIS — G309 Alzheimer's disease, unspecified: Secondary | ICD-10-CM | POA: Diagnosis not present

## 2016-04-05 DIAGNOSIS — M25511 Pain in right shoulder: Secondary | ICD-10-CM | POA: Diagnosis not present

## 2016-04-05 DIAGNOSIS — R251 Tremor, unspecified: Secondary | ICD-10-CM | POA: Diagnosis not present

## 2016-04-05 DIAGNOSIS — J9612 Chronic respiratory failure with hypercapnia: Secondary | ICD-10-CM | POA: Diagnosis not present

## 2016-04-05 DIAGNOSIS — I10 Essential (primary) hypertension: Secondary | ICD-10-CM | POA: Diagnosis not present

## 2016-04-05 DIAGNOSIS — D649 Anemia, unspecified: Secondary | ICD-10-CM | POA: Diagnosis not present

## 2016-04-07 DIAGNOSIS — J9612 Chronic respiratory failure with hypercapnia: Secondary | ICD-10-CM | POA: Diagnosis not present

## 2016-04-07 DIAGNOSIS — D649 Anemia, unspecified: Secondary | ICD-10-CM | POA: Diagnosis not present

## 2016-04-07 DIAGNOSIS — S91302D Unspecified open wound, left foot, subsequent encounter: Secondary | ICD-10-CM | POA: Diagnosis not present

## 2016-04-07 DIAGNOSIS — R251 Tremor, unspecified: Secondary | ICD-10-CM | POA: Diagnosis not present

## 2016-04-07 DIAGNOSIS — M25511 Pain in right shoulder: Secondary | ICD-10-CM | POA: Diagnosis not present

## 2016-04-07 DIAGNOSIS — M1991 Primary osteoarthritis, unspecified site: Secondary | ICD-10-CM | POA: Diagnosis not present

## 2016-04-07 DIAGNOSIS — I10 Essential (primary) hypertension: Secondary | ICD-10-CM | POA: Diagnosis not present

## 2016-04-07 DIAGNOSIS — G309 Alzheimer's disease, unspecified: Secondary | ICD-10-CM | POA: Diagnosis not present

## 2016-04-07 DIAGNOSIS — J690 Pneumonitis due to inhalation of food and vomit: Secondary | ICD-10-CM | POA: Diagnosis not present

## 2016-04-07 DIAGNOSIS — J441 Chronic obstructive pulmonary disease with (acute) exacerbation: Secondary | ICD-10-CM | POA: Diagnosis not present

## 2016-04-09 DIAGNOSIS — J441 Chronic obstructive pulmonary disease with (acute) exacerbation: Secondary | ICD-10-CM | POA: Diagnosis not present

## 2016-04-09 DIAGNOSIS — M25511 Pain in right shoulder: Secondary | ICD-10-CM | POA: Diagnosis not present

## 2016-04-09 DIAGNOSIS — D649 Anemia, unspecified: Secondary | ICD-10-CM | POA: Diagnosis not present

## 2016-04-09 DIAGNOSIS — J9612 Chronic respiratory failure with hypercapnia: Secondary | ICD-10-CM | POA: Diagnosis not present

## 2016-04-09 DIAGNOSIS — G309 Alzheimer's disease, unspecified: Secondary | ICD-10-CM | POA: Diagnosis not present

## 2016-04-09 DIAGNOSIS — M1991 Primary osteoarthritis, unspecified site: Secondary | ICD-10-CM | POA: Diagnosis not present

## 2016-04-09 DIAGNOSIS — S91302D Unspecified open wound, left foot, subsequent encounter: Secondary | ICD-10-CM | POA: Diagnosis not present

## 2016-04-09 DIAGNOSIS — J690 Pneumonitis due to inhalation of food and vomit: Secondary | ICD-10-CM | POA: Diagnosis not present

## 2016-04-09 DIAGNOSIS — I10 Essential (primary) hypertension: Secondary | ICD-10-CM | POA: Diagnosis not present

## 2016-04-09 DIAGNOSIS — R251 Tremor, unspecified: Secondary | ICD-10-CM | POA: Diagnosis not present

## 2016-04-12 DIAGNOSIS — Z4800 Encounter for change or removal of nonsurgical wound dressing: Secondary | ICD-10-CM | POA: Diagnosis not present

## 2016-04-12 DIAGNOSIS — R251 Tremor, unspecified: Secondary | ICD-10-CM | POA: Diagnosis not present

## 2016-04-12 DIAGNOSIS — M1991 Primary osteoarthritis, unspecified site: Secondary | ICD-10-CM | POA: Diagnosis not present

## 2016-04-12 DIAGNOSIS — R279 Unspecified lack of coordination: Secondary | ICD-10-CM | POA: Diagnosis not present

## 2016-04-12 DIAGNOSIS — D649 Anemia, unspecified: Secondary | ICD-10-CM | POA: Diagnosis not present

## 2016-04-12 DIAGNOSIS — S91302D Unspecified open wound, left foot, subsequent encounter: Secondary | ICD-10-CM | POA: Diagnosis not present

## 2016-04-12 DIAGNOSIS — G309 Alzheimer's disease, unspecified: Secondary | ICD-10-CM | POA: Diagnosis not present

## 2016-04-12 DIAGNOSIS — J9612 Chronic respiratory failure with hypercapnia: Secondary | ICD-10-CM | POA: Diagnosis not present

## 2016-04-12 DIAGNOSIS — I10 Essential (primary) hypertension: Secondary | ICD-10-CM | POA: Diagnosis not present

## 2016-04-12 DIAGNOSIS — J020 Streptococcal pharyngitis: Secondary | ICD-10-CM | POA: Diagnosis not present

## 2016-04-12 DIAGNOSIS — J449 Chronic obstructive pulmonary disease, unspecified: Secondary | ICD-10-CM | POA: Diagnosis not present

## 2016-04-13 DIAGNOSIS — R251 Tremor, unspecified: Secondary | ICD-10-CM | POA: Diagnosis not present

## 2016-04-13 DIAGNOSIS — G309 Alzheimer's disease, unspecified: Secondary | ICD-10-CM | POA: Diagnosis not present

## 2016-04-13 DIAGNOSIS — J449 Chronic obstructive pulmonary disease, unspecified: Secondary | ICD-10-CM | POA: Diagnosis not present

## 2016-04-13 DIAGNOSIS — S91302D Unspecified open wound, left foot, subsequent encounter: Secondary | ICD-10-CM | POA: Diagnosis not present

## 2016-04-13 DIAGNOSIS — M1991 Primary osteoarthritis, unspecified site: Secondary | ICD-10-CM | POA: Diagnosis not present

## 2016-04-13 DIAGNOSIS — Z4800 Encounter for change or removal of nonsurgical wound dressing: Secondary | ICD-10-CM | POA: Diagnosis not present

## 2016-04-13 DIAGNOSIS — J9612 Chronic respiratory failure with hypercapnia: Secondary | ICD-10-CM | POA: Diagnosis not present

## 2016-04-13 DIAGNOSIS — R279 Unspecified lack of coordination: Secondary | ICD-10-CM | POA: Diagnosis not present

## 2016-04-13 DIAGNOSIS — D649 Anemia, unspecified: Secondary | ICD-10-CM | POA: Diagnosis not present

## 2016-04-13 DIAGNOSIS — I10 Essential (primary) hypertension: Secondary | ICD-10-CM | POA: Diagnosis not present

## 2016-04-16 DIAGNOSIS — Z4800 Encounter for change or removal of nonsurgical wound dressing: Secondary | ICD-10-CM | POA: Diagnosis not present

## 2016-04-16 DIAGNOSIS — I10 Essential (primary) hypertension: Secondary | ICD-10-CM | POA: Diagnosis not present

## 2016-04-16 DIAGNOSIS — D649 Anemia, unspecified: Secondary | ICD-10-CM | POA: Diagnosis not present

## 2016-04-16 DIAGNOSIS — R279 Unspecified lack of coordination: Secondary | ICD-10-CM | POA: Diagnosis not present

## 2016-04-16 DIAGNOSIS — S91302D Unspecified open wound, left foot, subsequent encounter: Secondary | ICD-10-CM | POA: Diagnosis not present

## 2016-04-16 DIAGNOSIS — J449 Chronic obstructive pulmonary disease, unspecified: Secondary | ICD-10-CM | POA: Diagnosis not present

## 2016-04-16 DIAGNOSIS — R251 Tremor, unspecified: Secondary | ICD-10-CM | POA: Diagnosis not present

## 2016-04-16 DIAGNOSIS — G309 Alzheimer's disease, unspecified: Secondary | ICD-10-CM | POA: Diagnosis not present

## 2016-04-16 DIAGNOSIS — J9612 Chronic respiratory failure with hypercapnia: Secondary | ICD-10-CM | POA: Diagnosis not present

## 2016-04-16 DIAGNOSIS — M1991 Primary osteoarthritis, unspecified site: Secondary | ICD-10-CM | POA: Diagnosis not present

## 2016-04-17 DIAGNOSIS — Z87891 Personal history of nicotine dependence: Secondary | ICD-10-CM | POA: Diagnosis not present

## 2016-04-17 DIAGNOSIS — K219 Gastro-esophageal reflux disease without esophagitis: Secondary | ICD-10-CM | POA: Diagnosis not present

## 2016-04-17 DIAGNOSIS — Z885 Allergy status to narcotic agent status: Secondary | ICD-10-CM | POA: Diagnosis not present

## 2016-04-17 DIAGNOSIS — J449 Chronic obstructive pulmonary disease, unspecified: Secondary | ICD-10-CM | POA: Diagnosis not present

## 2016-04-17 DIAGNOSIS — Z880 Allergy status to penicillin: Secondary | ICD-10-CM | POA: Diagnosis not present

## 2016-04-17 DIAGNOSIS — W19XXXA Unspecified fall, initial encounter: Secondary | ICD-10-CM | POA: Diagnosis not present

## 2016-04-17 DIAGNOSIS — Z881 Allergy status to other antibiotic agents status: Secondary | ICD-10-CM | POA: Diagnosis not present

## 2016-04-17 DIAGNOSIS — Z888 Allergy status to other drugs, medicaments and biological substances status: Secondary | ICD-10-CM | POA: Diagnosis not present

## 2016-04-17 DIAGNOSIS — Z791 Long term (current) use of non-steroidal anti-inflammatories (NSAID): Secondary | ICD-10-CM | POA: Diagnosis not present

## 2016-04-17 DIAGNOSIS — Z23 Encounter for immunization: Secondary | ICD-10-CM | POA: Diagnosis not present

## 2016-04-17 DIAGNOSIS — R52 Pain, unspecified: Secondary | ICD-10-CM | POA: Diagnosis not present

## 2016-04-17 DIAGNOSIS — S0990XA Unspecified injury of head, initial encounter: Secondary | ICD-10-CM | POA: Diagnosis not present

## 2016-04-17 DIAGNOSIS — Z79899 Other long term (current) drug therapy: Secondary | ICD-10-CM | POA: Diagnosis not present

## 2016-04-17 DIAGNOSIS — I10 Essential (primary) hypertension: Secondary | ICD-10-CM | POA: Diagnosis not present

## 2016-04-17 DIAGNOSIS — S0093XA Contusion of unspecified part of head, initial encounter: Secondary | ICD-10-CM | POA: Diagnosis not present

## 2016-04-17 DIAGNOSIS — M1612 Unilateral primary osteoarthritis, left hip: Secondary | ICD-10-CM | POA: Diagnosis not present

## 2016-04-17 DIAGNOSIS — S3993XA Unspecified injury of pelvis, initial encounter: Secondary | ICD-10-CM | POA: Diagnosis not present

## 2016-04-17 DIAGNOSIS — R269 Unspecified abnormalities of gait and mobility: Secondary | ICD-10-CM | POA: Diagnosis not present

## 2016-04-17 DIAGNOSIS — M25551 Pain in right hip: Secondary | ICD-10-CM | POA: Diagnosis not present

## 2016-04-17 DIAGNOSIS — S79912A Unspecified injury of left hip, initial encounter: Secondary | ICD-10-CM | POA: Diagnosis not present

## 2016-04-17 DIAGNOSIS — S79922A Unspecified injury of left thigh, initial encounter: Secondary | ICD-10-CM | POA: Diagnosis not present

## 2016-04-17 DIAGNOSIS — Z91012 Allergy to eggs: Secondary | ICD-10-CM | POA: Diagnosis not present

## 2016-04-19 DIAGNOSIS — R251 Tremor, unspecified: Secondary | ICD-10-CM | POA: Diagnosis not present

## 2016-04-19 DIAGNOSIS — M1991 Primary osteoarthritis, unspecified site: Secondary | ICD-10-CM | POA: Diagnosis not present

## 2016-04-19 DIAGNOSIS — S91302D Unspecified open wound, left foot, subsequent encounter: Secondary | ICD-10-CM | POA: Diagnosis not present

## 2016-04-19 DIAGNOSIS — I10 Essential (primary) hypertension: Secondary | ICD-10-CM | POA: Diagnosis not present

## 2016-04-19 DIAGNOSIS — R279 Unspecified lack of coordination: Secondary | ICD-10-CM | POA: Diagnosis not present

## 2016-04-19 DIAGNOSIS — J449 Chronic obstructive pulmonary disease, unspecified: Secondary | ICD-10-CM | POA: Diagnosis not present

## 2016-04-19 DIAGNOSIS — J9612 Chronic respiratory failure with hypercapnia: Secondary | ICD-10-CM | POA: Diagnosis not present

## 2016-04-19 DIAGNOSIS — D649 Anemia, unspecified: Secondary | ICD-10-CM | POA: Diagnosis not present

## 2016-04-19 DIAGNOSIS — Z4800 Encounter for change or removal of nonsurgical wound dressing: Secondary | ICD-10-CM | POA: Diagnosis not present

## 2016-04-19 DIAGNOSIS — G309 Alzheimer's disease, unspecified: Secondary | ICD-10-CM | POA: Diagnosis not present

## 2016-04-21 DIAGNOSIS — J9612 Chronic respiratory failure with hypercapnia: Secondary | ICD-10-CM | POA: Diagnosis not present

## 2016-04-21 DIAGNOSIS — I10 Essential (primary) hypertension: Secondary | ICD-10-CM | POA: Diagnosis not present

## 2016-04-21 DIAGNOSIS — R251 Tremor, unspecified: Secondary | ICD-10-CM | POA: Diagnosis not present

## 2016-04-21 DIAGNOSIS — Z961 Presence of intraocular lens: Secondary | ICD-10-CM | POA: Diagnosis not present

## 2016-04-21 DIAGNOSIS — H35033 Hypertensive retinopathy, bilateral: Secondary | ICD-10-CM | POA: Diagnosis not present

## 2016-04-21 DIAGNOSIS — R279 Unspecified lack of coordination: Secondary | ICD-10-CM | POA: Diagnosis not present

## 2016-04-21 DIAGNOSIS — J449 Chronic obstructive pulmonary disease, unspecified: Secondary | ICD-10-CM | POA: Diagnosis not present

## 2016-04-21 DIAGNOSIS — G309 Alzheimer's disease, unspecified: Secondary | ICD-10-CM | POA: Diagnosis not present

## 2016-04-21 DIAGNOSIS — S91302D Unspecified open wound, left foot, subsequent encounter: Secondary | ICD-10-CM | POA: Diagnosis not present

## 2016-04-21 DIAGNOSIS — M1991 Primary osteoarthritis, unspecified site: Secondary | ICD-10-CM | POA: Diagnosis not present

## 2016-04-21 DIAGNOSIS — H353131 Nonexudative age-related macular degeneration, bilateral, early dry stage: Secondary | ICD-10-CM | POA: Diagnosis not present

## 2016-04-21 DIAGNOSIS — H18413 Arcus senilis, bilateral: Secondary | ICD-10-CM | POA: Diagnosis not present

## 2016-04-21 DIAGNOSIS — H43813 Vitreous degeneration, bilateral: Secondary | ICD-10-CM | POA: Diagnosis not present

## 2016-04-21 DIAGNOSIS — D649 Anemia, unspecified: Secondary | ICD-10-CM | POA: Diagnosis not present

## 2016-04-21 DIAGNOSIS — Z4800 Encounter for change or removal of nonsurgical wound dressing: Secondary | ICD-10-CM | POA: Diagnosis not present

## 2016-04-23 DIAGNOSIS — Z888 Allergy status to other drugs, medicaments and biological substances status: Secondary | ICD-10-CM | POA: Diagnosis not present

## 2016-04-23 DIAGNOSIS — M1990 Unspecified osteoarthritis, unspecified site: Secondary | ICD-10-CM | POA: Diagnosis not present

## 2016-04-23 DIAGNOSIS — J449 Chronic obstructive pulmonary disease, unspecified: Secondary | ICD-10-CM | POA: Diagnosis not present

## 2016-04-23 DIAGNOSIS — Z86718 Personal history of other venous thrombosis and embolism: Secondary | ICD-10-CM | POA: Diagnosis not present

## 2016-04-23 DIAGNOSIS — M79609 Pain in unspecified limb: Secondary | ICD-10-CM | POA: Diagnosis not present

## 2016-04-23 DIAGNOSIS — L03115 Cellulitis of right lower limb: Secondary | ICD-10-CM | POA: Diagnosis not present

## 2016-04-23 DIAGNOSIS — Z883 Allergy status to other anti-infective agents status: Secondary | ICD-10-CM | POA: Diagnosis not present

## 2016-04-23 DIAGNOSIS — Z880 Allergy status to penicillin: Secondary | ICD-10-CM | POA: Diagnosis not present

## 2016-04-23 DIAGNOSIS — K219 Gastro-esophageal reflux disease without esophagitis: Secondary | ICD-10-CM | POA: Diagnosis not present

## 2016-04-23 DIAGNOSIS — Z87891 Personal history of nicotine dependence: Secondary | ICD-10-CM | POA: Diagnosis not present

## 2016-04-23 DIAGNOSIS — M79604 Pain in right leg: Secondary | ICD-10-CM | POA: Diagnosis not present

## 2016-04-23 DIAGNOSIS — I10 Essential (primary) hypertension: Secondary | ICD-10-CM | POA: Diagnosis not present

## 2016-04-23 DIAGNOSIS — R2241 Localized swelling, mass and lump, right lower limb: Secondary | ICD-10-CM | POA: Diagnosis not present

## 2016-04-23 DIAGNOSIS — Z7409 Other reduced mobility: Secondary | ICD-10-CM | POA: Diagnosis not present

## 2016-04-23 DIAGNOSIS — Z885 Allergy status to narcotic agent status: Secondary | ICD-10-CM | POA: Diagnosis not present

## 2016-04-23 DIAGNOSIS — Z91012 Allergy to eggs: Secondary | ICD-10-CM | POA: Diagnosis not present

## 2016-04-23 DIAGNOSIS — Z79899 Other long term (current) drug therapy: Secondary | ICD-10-CM | POA: Diagnosis not present

## 2016-04-23 DIAGNOSIS — D649 Anemia, unspecified: Secondary | ICD-10-CM | POA: Diagnosis not present

## 2016-04-23 DIAGNOSIS — R600 Localized edema: Secondary | ICD-10-CM | POA: Diagnosis not present

## 2016-04-23 DIAGNOSIS — L0390 Cellulitis, unspecified: Secondary | ICD-10-CM | POA: Diagnosis not present

## 2016-04-23 DIAGNOSIS — Z7982 Long term (current) use of aspirin: Secondary | ICD-10-CM | POA: Diagnosis not present

## 2016-04-23 DIAGNOSIS — M79606 Pain in leg, unspecified: Secondary | ICD-10-CM | POA: Diagnosis not present

## 2016-04-26 DIAGNOSIS — G309 Alzheimer's disease, unspecified: Secondary | ICD-10-CM | POA: Diagnosis not present

## 2016-04-26 DIAGNOSIS — R251 Tremor, unspecified: Secondary | ICD-10-CM | POA: Diagnosis not present

## 2016-04-26 DIAGNOSIS — J449 Chronic obstructive pulmonary disease, unspecified: Secondary | ICD-10-CM | POA: Diagnosis not present

## 2016-04-26 DIAGNOSIS — M1991 Primary osteoarthritis, unspecified site: Secondary | ICD-10-CM | POA: Diagnosis not present

## 2016-04-26 DIAGNOSIS — Z4800 Encounter for change or removal of nonsurgical wound dressing: Secondary | ICD-10-CM | POA: Diagnosis not present

## 2016-04-26 DIAGNOSIS — R279 Unspecified lack of coordination: Secondary | ICD-10-CM | POA: Diagnosis not present

## 2016-04-26 DIAGNOSIS — J9612 Chronic respiratory failure with hypercapnia: Secondary | ICD-10-CM | POA: Diagnosis not present

## 2016-04-26 DIAGNOSIS — I10 Essential (primary) hypertension: Secondary | ICD-10-CM | POA: Diagnosis not present

## 2016-04-26 DIAGNOSIS — S91302D Unspecified open wound, left foot, subsequent encounter: Secondary | ICD-10-CM | POA: Diagnosis not present

## 2016-04-26 DIAGNOSIS — D649 Anemia, unspecified: Secondary | ICD-10-CM | POA: Diagnosis not present

## 2016-04-28 DIAGNOSIS — S91302D Unspecified open wound, left foot, subsequent encounter: Secondary | ICD-10-CM | POA: Diagnosis not present

## 2016-04-28 DIAGNOSIS — M1991 Primary osteoarthritis, unspecified site: Secondary | ICD-10-CM | POA: Diagnosis not present

## 2016-04-28 DIAGNOSIS — G309 Alzheimer's disease, unspecified: Secondary | ICD-10-CM | POA: Diagnosis not present

## 2016-04-28 DIAGNOSIS — D649 Anemia, unspecified: Secondary | ICD-10-CM | POA: Diagnosis not present

## 2016-04-28 DIAGNOSIS — I10 Essential (primary) hypertension: Secondary | ICD-10-CM | POA: Diagnosis not present

## 2016-04-28 DIAGNOSIS — R279 Unspecified lack of coordination: Secondary | ICD-10-CM | POA: Diagnosis not present

## 2016-04-28 DIAGNOSIS — J9612 Chronic respiratory failure with hypercapnia: Secondary | ICD-10-CM | POA: Diagnosis not present

## 2016-04-28 DIAGNOSIS — Z4800 Encounter for change or removal of nonsurgical wound dressing: Secondary | ICD-10-CM | POA: Diagnosis not present

## 2016-04-28 DIAGNOSIS — J449 Chronic obstructive pulmonary disease, unspecified: Secondary | ICD-10-CM | POA: Diagnosis not present

## 2016-04-28 DIAGNOSIS — R251 Tremor, unspecified: Secondary | ICD-10-CM | POA: Diagnosis not present

## 2016-04-29 DIAGNOSIS — J449 Chronic obstructive pulmonary disease, unspecified: Secondary | ICD-10-CM | POA: Diagnosis not present

## 2016-04-29 DIAGNOSIS — J9612 Chronic respiratory failure with hypercapnia: Secondary | ICD-10-CM | POA: Diagnosis not present

## 2016-04-29 DIAGNOSIS — R279 Unspecified lack of coordination: Secondary | ICD-10-CM | POA: Diagnosis not present

## 2016-04-29 DIAGNOSIS — G309 Alzheimer's disease, unspecified: Secondary | ICD-10-CM | POA: Diagnosis not present

## 2016-04-29 DIAGNOSIS — M1991 Primary osteoarthritis, unspecified site: Secondary | ICD-10-CM | POA: Diagnosis not present

## 2016-04-29 DIAGNOSIS — S91302D Unspecified open wound, left foot, subsequent encounter: Secondary | ICD-10-CM | POA: Diagnosis not present

## 2016-04-29 DIAGNOSIS — D649 Anemia, unspecified: Secondary | ICD-10-CM | POA: Diagnosis not present

## 2016-04-29 DIAGNOSIS — I10 Essential (primary) hypertension: Secondary | ICD-10-CM | POA: Diagnosis not present

## 2016-04-29 DIAGNOSIS — Z4800 Encounter for change or removal of nonsurgical wound dressing: Secondary | ICD-10-CM | POA: Diagnosis not present

## 2016-04-29 DIAGNOSIS — R251 Tremor, unspecified: Secondary | ICD-10-CM | POA: Diagnosis not present

## 2016-04-30 DIAGNOSIS — M1991 Primary osteoarthritis, unspecified site: Secondary | ICD-10-CM | POA: Diagnosis not present

## 2016-04-30 DIAGNOSIS — G309 Alzheimer's disease, unspecified: Secondary | ICD-10-CM | POA: Diagnosis not present

## 2016-04-30 DIAGNOSIS — I10 Essential (primary) hypertension: Secondary | ICD-10-CM | POA: Diagnosis not present

## 2016-04-30 DIAGNOSIS — R279 Unspecified lack of coordination: Secondary | ICD-10-CM | POA: Diagnosis not present

## 2016-04-30 DIAGNOSIS — Z4800 Encounter for change or removal of nonsurgical wound dressing: Secondary | ICD-10-CM | POA: Diagnosis not present

## 2016-04-30 DIAGNOSIS — D649 Anemia, unspecified: Secondary | ICD-10-CM | POA: Diagnosis not present

## 2016-04-30 DIAGNOSIS — J449 Chronic obstructive pulmonary disease, unspecified: Secondary | ICD-10-CM | POA: Diagnosis not present

## 2016-04-30 DIAGNOSIS — R251 Tremor, unspecified: Secondary | ICD-10-CM | POA: Diagnosis not present

## 2016-04-30 DIAGNOSIS — S91302D Unspecified open wound, left foot, subsequent encounter: Secondary | ICD-10-CM | POA: Diagnosis not present

## 2016-04-30 DIAGNOSIS — J9612 Chronic respiratory failure with hypercapnia: Secondary | ICD-10-CM | POA: Diagnosis not present

## 2016-05-03 DIAGNOSIS — J449 Chronic obstructive pulmonary disease, unspecified: Secondary | ICD-10-CM | POA: Diagnosis not present

## 2016-05-03 DIAGNOSIS — Z4800 Encounter for change or removal of nonsurgical wound dressing: Secondary | ICD-10-CM | POA: Diagnosis not present

## 2016-05-03 DIAGNOSIS — S91302D Unspecified open wound, left foot, subsequent encounter: Secondary | ICD-10-CM | POA: Diagnosis not present

## 2016-05-03 DIAGNOSIS — G309 Alzheimer's disease, unspecified: Secondary | ICD-10-CM | POA: Diagnosis not present

## 2016-05-03 DIAGNOSIS — I10 Essential (primary) hypertension: Secondary | ICD-10-CM | POA: Diagnosis not present

## 2016-05-03 DIAGNOSIS — J9612 Chronic respiratory failure with hypercapnia: Secondary | ICD-10-CM | POA: Diagnosis not present

## 2016-05-03 DIAGNOSIS — R279 Unspecified lack of coordination: Secondary | ICD-10-CM | POA: Diagnosis not present

## 2016-05-03 DIAGNOSIS — D649 Anemia, unspecified: Secondary | ICD-10-CM | POA: Diagnosis not present

## 2016-05-03 DIAGNOSIS — R251 Tremor, unspecified: Secondary | ICD-10-CM | POA: Diagnosis not present

## 2016-05-03 DIAGNOSIS — M1991 Primary osteoarthritis, unspecified site: Secondary | ICD-10-CM | POA: Diagnosis not present

## 2016-05-05 DIAGNOSIS — S91302D Unspecified open wound, left foot, subsequent encounter: Secondary | ICD-10-CM | POA: Diagnosis not present

## 2016-05-05 DIAGNOSIS — Z4800 Encounter for change or removal of nonsurgical wound dressing: Secondary | ICD-10-CM | POA: Diagnosis not present

## 2016-05-05 DIAGNOSIS — J449 Chronic obstructive pulmonary disease, unspecified: Secondary | ICD-10-CM | POA: Diagnosis not present

## 2016-05-05 DIAGNOSIS — G309 Alzheimer's disease, unspecified: Secondary | ICD-10-CM | POA: Diagnosis not present

## 2016-05-05 DIAGNOSIS — I10 Essential (primary) hypertension: Secondary | ICD-10-CM | POA: Diagnosis not present

## 2016-05-05 DIAGNOSIS — R279 Unspecified lack of coordination: Secondary | ICD-10-CM | POA: Diagnosis not present

## 2016-05-05 DIAGNOSIS — D649 Anemia, unspecified: Secondary | ICD-10-CM | POA: Diagnosis not present

## 2016-05-05 DIAGNOSIS — R251 Tremor, unspecified: Secondary | ICD-10-CM | POA: Diagnosis not present

## 2016-05-05 DIAGNOSIS — M1991 Primary osteoarthritis, unspecified site: Secondary | ICD-10-CM | POA: Diagnosis not present

## 2016-05-05 DIAGNOSIS — J9612 Chronic respiratory failure with hypercapnia: Secondary | ICD-10-CM | POA: Diagnosis not present

## 2016-05-06 DIAGNOSIS — S91302D Unspecified open wound, left foot, subsequent encounter: Secondary | ICD-10-CM | POA: Diagnosis not present

## 2016-05-06 DIAGNOSIS — M1991 Primary osteoarthritis, unspecified site: Secondary | ICD-10-CM | POA: Diagnosis not present

## 2016-05-06 DIAGNOSIS — J9612 Chronic respiratory failure with hypercapnia: Secondary | ICD-10-CM | POA: Diagnosis not present

## 2016-05-06 DIAGNOSIS — R251 Tremor, unspecified: Secondary | ICD-10-CM | POA: Diagnosis not present

## 2016-05-06 DIAGNOSIS — J449 Chronic obstructive pulmonary disease, unspecified: Secondary | ICD-10-CM | POA: Diagnosis not present

## 2016-05-06 DIAGNOSIS — G309 Alzheimer's disease, unspecified: Secondary | ICD-10-CM | POA: Diagnosis not present

## 2016-05-06 DIAGNOSIS — D649 Anemia, unspecified: Secondary | ICD-10-CM | POA: Diagnosis not present

## 2016-05-06 DIAGNOSIS — Z4800 Encounter for change or removal of nonsurgical wound dressing: Secondary | ICD-10-CM | POA: Diagnosis not present

## 2016-05-06 DIAGNOSIS — R279 Unspecified lack of coordination: Secondary | ICD-10-CM | POA: Diagnosis not present

## 2016-05-06 DIAGNOSIS — I10 Essential (primary) hypertension: Secondary | ICD-10-CM | POA: Diagnosis not present

## 2016-05-07 DIAGNOSIS — R251 Tremor, unspecified: Secondary | ICD-10-CM | POA: Diagnosis not present

## 2016-05-07 DIAGNOSIS — D649 Anemia, unspecified: Secondary | ICD-10-CM | POA: Diagnosis not present

## 2016-05-07 DIAGNOSIS — Z4800 Encounter for change or removal of nonsurgical wound dressing: Secondary | ICD-10-CM | POA: Diagnosis not present

## 2016-05-07 DIAGNOSIS — J449 Chronic obstructive pulmonary disease, unspecified: Secondary | ICD-10-CM | POA: Diagnosis not present

## 2016-05-07 DIAGNOSIS — S91302D Unspecified open wound, left foot, subsequent encounter: Secondary | ICD-10-CM | POA: Diagnosis not present

## 2016-05-07 DIAGNOSIS — I10 Essential (primary) hypertension: Secondary | ICD-10-CM | POA: Diagnosis not present

## 2016-05-07 DIAGNOSIS — R279 Unspecified lack of coordination: Secondary | ICD-10-CM | POA: Diagnosis not present

## 2016-05-07 DIAGNOSIS — J9612 Chronic respiratory failure with hypercapnia: Secondary | ICD-10-CM | POA: Diagnosis not present

## 2016-05-07 DIAGNOSIS — M1991 Primary osteoarthritis, unspecified site: Secondary | ICD-10-CM | POA: Diagnosis not present

## 2016-05-07 DIAGNOSIS — G309 Alzheimer's disease, unspecified: Secondary | ICD-10-CM | POA: Diagnosis not present

## 2016-05-10 DIAGNOSIS — S91302D Unspecified open wound, left foot, subsequent encounter: Secondary | ICD-10-CM | POA: Diagnosis not present

## 2016-05-10 DIAGNOSIS — R251 Tremor, unspecified: Secondary | ICD-10-CM | POA: Diagnosis not present

## 2016-05-10 DIAGNOSIS — J9612 Chronic respiratory failure with hypercapnia: Secondary | ICD-10-CM | POA: Diagnosis not present

## 2016-05-10 DIAGNOSIS — Z4800 Encounter for change or removal of nonsurgical wound dressing: Secondary | ICD-10-CM | POA: Diagnosis not present

## 2016-05-10 DIAGNOSIS — R279 Unspecified lack of coordination: Secondary | ICD-10-CM | POA: Diagnosis not present

## 2016-05-10 DIAGNOSIS — J449 Chronic obstructive pulmonary disease, unspecified: Secondary | ICD-10-CM | POA: Diagnosis not present

## 2016-05-10 DIAGNOSIS — M1991 Primary osteoarthritis, unspecified site: Secondary | ICD-10-CM | POA: Diagnosis not present

## 2016-05-10 DIAGNOSIS — I10 Essential (primary) hypertension: Secondary | ICD-10-CM | POA: Diagnosis not present

## 2016-05-10 DIAGNOSIS — D649 Anemia, unspecified: Secondary | ICD-10-CM | POA: Diagnosis not present

## 2016-05-10 DIAGNOSIS — G309 Alzheimer's disease, unspecified: Secondary | ICD-10-CM | POA: Diagnosis not present

## 2016-05-11 DIAGNOSIS — Z79899 Other long term (current) drug therapy: Secondary | ICD-10-CM | POA: Diagnosis not present

## 2016-05-12 DIAGNOSIS — I10 Essential (primary) hypertension: Secondary | ICD-10-CM | POA: Diagnosis not present

## 2016-05-12 DIAGNOSIS — G309 Alzheimer's disease, unspecified: Secondary | ICD-10-CM | POA: Diagnosis not present

## 2016-05-12 DIAGNOSIS — R279 Unspecified lack of coordination: Secondary | ICD-10-CM | POA: Diagnosis not present

## 2016-05-12 DIAGNOSIS — J9612 Chronic respiratory failure with hypercapnia: Secondary | ICD-10-CM | POA: Diagnosis not present

## 2016-05-12 DIAGNOSIS — Z4800 Encounter for change or removal of nonsurgical wound dressing: Secondary | ICD-10-CM | POA: Diagnosis not present

## 2016-05-12 DIAGNOSIS — R251 Tremor, unspecified: Secondary | ICD-10-CM | POA: Diagnosis not present

## 2016-05-12 DIAGNOSIS — M1991 Primary osteoarthritis, unspecified site: Secondary | ICD-10-CM | POA: Diagnosis not present

## 2016-05-12 DIAGNOSIS — S91302D Unspecified open wound, left foot, subsequent encounter: Secondary | ICD-10-CM | POA: Diagnosis not present

## 2016-05-12 DIAGNOSIS — J449 Chronic obstructive pulmonary disease, unspecified: Secondary | ICD-10-CM | POA: Diagnosis not present

## 2016-05-12 DIAGNOSIS — D649 Anemia, unspecified: Secondary | ICD-10-CM | POA: Diagnosis not present

## 2016-05-13 DIAGNOSIS — R279 Unspecified lack of coordination: Secondary | ICD-10-CM | POA: Diagnosis not present

## 2016-05-13 DIAGNOSIS — J449 Chronic obstructive pulmonary disease, unspecified: Secondary | ICD-10-CM | POA: Diagnosis not present

## 2016-05-13 DIAGNOSIS — I10 Essential (primary) hypertension: Secondary | ICD-10-CM | POA: Diagnosis not present

## 2016-05-13 DIAGNOSIS — R251 Tremor, unspecified: Secondary | ICD-10-CM | POA: Diagnosis not present

## 2016-05-13 DIAGNOSIS — K649 Unspecified hemorrhoids: Secondary | ICD-10-CM | POA: Diagnosis not present

## 2016-05-13 DIAGNOSIS — R918 Other nonspecific abnormal finding of lung field: Secondary | ICD-10-CM | POA: Diagnosis not present

## 2016-05-13 DIAGNOSIS — Z4800 Encounter for change or removal of nonsurgical wound dressing: Secondary | ICD-10-CM | POA: Diagnosis not present

## 2016-05-13 DIAGNOSIS — D649 Anemia, unspecified: Secondary | ICD-10-CM | POA: Diagnosis not present

## 2016-05-13 DIAGNOSIS — M1991 Primary osteoarthritis, unspecified site: Secondary | ICD-10-CM | POA: Diagnosis not present

## 2016-05-13 DIAGNOSIS — J9612 Chronic respiratory failure with hypercapnia: Secondary | ICD-10-CM | POA: Diagnosis not present

## 2016-05-13 DIAGNOSIS — K210 Gastro-esophageal reflux disease with esophagitis: Secondary | ICD-10-CM | POA: Diagnosis not present

## 2016-05-13 DIAGNOSIS — L89311 Pressure ulcer of right buttock, stage 1: Secondary | ICD-10-CM | POA: Diagnosis not present

## 2016-05-13 DIAGNOSIS — S91302D Unspecified open wound, left foot, subsequent encounter: Secondary | ICD-10-CM | POA: Diagnosis not present

## 2016-05-13 DIAGNOSIS — G309 Alzheimer's disease, unspecified: Secondary | ICD-10-CM | POA: Diagnosis not present

## 2016-05-14 DIAGNOSIS — D649 Anemia, unspecified: Secondary | ICD-10-CM | POA: Diagnosis not present

## 2016-05-14 DIAGNOSIS — R251 Tremor, unspecified: Secondary | ICD-10-CM | POA: Diagnosis not present

## 2016-05-14 DIAGNOSIS — S91302D Unspecified open wound, left foot, subsequent encounter: Secondary | ICD-10-CM | POA: Diagnosis not present

## 2016-05-14 DIAGNOSIS — I10 Essential (primary) hypertension: Secondary | ICD-10-CM | POA: Diagnosis not present

## 2016-05-14 DIAGNOSIS — M1991 Primary osteoarthritis, unspecified site: Secondary | ICD-10-CM | POA: Diagnosis not present

## 2016-05-14 DIAGNOSIS — J9612 Chronic respiratory failure with hypercapnia: Secondary | ICD-10-CM | POA: Diagnosis not present

## 2016-05-14 DIAGNOSIS — Z4800 Encounter for change or removal of nonsurgical wound dressing: Secondary | ICD-10-CM | POA: Diagnosis not present

## 2016-05-14 DIAGNOSIS — G309 Alzheimer's disease, unspecified: Secondary | ICD-10-CM | POA: Diagnosis not present

## 2016-05-14 DIAGNOSIS — R279 Unspecified lack of coordination: Secondary | ICD-10-CM | POA: Diagnosis not present

## 2016-05-14 DIAGNOSIS — J449 Chronic obstructive pulmonary disease, unspecified: Secondary | ICD-10-CM | POA: Diagnosis not present

## 2016-05-19 DIAGNOSIS — Z4800 Encounter for change or removal of nonsurgical wound dressing: Secondary | ICD-10-CM | POA: Diagnosis not present

## 2016-05-19 DIAGNOSIS — I10 Essential (primary) hypertension: Secondary | ICD-10-CM | POA: Diagnosis not present

## 2016-05-19 DIAGNOSIS — R279 Unspecified lack of coordination: Secondary | ICD-10-CM | POA: Diagnosis not present

## 2016-05-19 DIAGNOSIS — M1991 Primary osteoarthritis, unspecified site: Secondary | ICD-10-CM | POA: Diagnosis not present

## 2016-05-19 DIAGNOSIS — S91302D Unspecified open wound, left foot, subsequent encounter: Secondary | ICD-10-CM | POA: Diagnosis not present

## 2016-05-19 DIAGNOSIS — G309 Alzheimer's disease, unspecified: Secondary | ICD-10-CM | POA: Diagnosis not present

## 2016-05-19 DIAGNOSIS — J9612 Chronic respiratory failure with hypercapnia: Secondary | ICD-10-CM | POA: Diagnosis not present

## 2016-05-19 DIAGNOSIS — D649 Anemia, unspecified: Secondary | ICD-10-CM | POA: Diagnosis not present

## 2016-05-19 DIAGNOSIS — R251 Tremor, unspecified: Secondary | ICD-10-CM | POA: Diagnosis not present

## 2016-05-19 DIAGNOSIS — J449 Chronic obstructive pulmonary disease, unspecified: Secondary | ICD-10-CM | POA: Diagnosis not present

## 2016-05-20 DIAGNOSIS — J449 Chronic obstructive pulmonary disease, unspecified: Secondary | ICD-10-CM | POA: Diagnosis not present

## 2016-05-20 DIAGNOSIS — I10 Essential (primary) hypertension: Secondary | ICD-10-CM | POA: Diagnosis not present

## 2016-05-20 DIAGNOSIS — R279 Unspecified lack of coordination: Secondary | ICD-10-CM | POA: Diagnosis not present

## 2016-05-20 DIAGNOSIS — S91302D Unspecified open wound, left foot, subsequent encounter: Secondary | ICD-10-CM | POA: Diagnosis not present

## 2016-05-20 DIAGNOSIS — G894 Chronic pain syndrome: Secondary | ICD-10-CM | POA: Diagnosis not present

## 2016-05-20 DIAGNOSIS — G309 Alzheimer's disease, unspecified: Secondary | ICD-10-CM | POA: Diagnosis not present

## 2016-05-20 DIAGNOSIS — D649 Anemia, unspecified: Secondary | ICD-10-CM | POA: Diagnosis not present

## 2016-05-20 DIAGNOSIS — Z4800 Encounter for change or removal of nonsurgical wound dressing: Secondary | ICD-10-CM | POA: Diagnosis not present

## 2016-05-20 DIAGNOSIS — M1991 Primary osteoarthritis, unspecified site: Secondary | ICD-10-CM | POA: Diagnosis not present

## 2016-05-20 DIAGNOSIS — J9612 Chronic respiratory failure with hypercapnia: Secondary | ICD-10-CM | POA: Diagnosis not present

## 2016-05-20 DIAGNOSIS — R251 Tremor, unspecified: Secondary | ICD-10-CM | POA: Diagnosis not present

## 2016-05-21 DIAGNOSIS — Z4800 Encounter for change or removal of nonsurgical wound dressing: Secondary | ICD-10-CM | POA: Diagnosis not present

## 2016-05-21 DIAGNOSIS — M1991 Primary osteoarthritis, unspecified site: Secondary | ICD-10-CM | POA: Diagnosis not present

## 2016-05-21 DIAGNOSIS — R279 Unspecified lack of coordination: Secondary | ICD-10-CM | POA: Diagnosis not present

## 2016-05-21 DIAGNOSIS — J9612 Chronic respiratory failure with hypercapnia: Secondary | ICD-10-CM | POA: Diagnosis not present

## 2016-05-21 DIAGNOSIS — D649 Anemia, unspecified: Secondary | ICD-10-CM | POA: Diagnosis not present

## 2016-05-21 DIAGNOSIS — G309 Alzheimer's disease, unspecified: Secondary | ICD-10-CM | POA: Diagnosis not present

## 2016-05-21 DIAGNOSIS — S91302D Unspecified open wound, left foot, subsequent encounter: Secondary | ICD-10-CM | POA: Diagnosis not present

## 2016-05-21 DIAGNOSIS — R251 Tremor, unspecified: Secondary | ICD-10-CM | POA: Diagnosis not present

## 2016-05-21 DIAGNOSIS — J449 Chronic obstructive pulmonary disease, unspecified: Secondary | ICD-10-CM | POA: Diagnosis not present

## 2016-05-21 DIAGNOSIS — I10 Essential (primary) hypertension: Secondary | ICD-10-CM | POA: Diagnosis not present

## 2016-05-25 DIAGNOSIS — J449 Chronic obstructive pulmonary disease, unspecified: Secondary | ICD-10-CM | POA: Diagnosis not present

## 2016-05-25 DIAGNOSIS — Z4800 Encounter for change or removal of nonsurgical wound dressing: Secondary | ICD-10-CM | POA: Diagnosis not present

## 2016-05-25 DIAGNOSIS — J9612 Chronic respiratory failure with hypercapnia: Secondary | ICD-10-CM | POA: Diagnosis not present

## 2016-05-25 DIAGNOSIS — I10 Essential (primary) hypertension: Secondary | ICD-10-CM | POA: Diagnosis not present

## 2016-05-25 DIAGNOSIS — D649 Anemia, unspecified: Secondary | ICD-10-CM | POA: Diagnosis not present

## 2016-05-25 DIAGNOSIS — R279 Unspecified lack of coordination: Secondary | ICD-10-CM | POA: Diagnosis not present

## 2016-05-25 DIAGNOSIS — S91302D Unspecified open wound, left foot, subsequent encounter: Secondary | ICD-10-CM | POA: Diagnosis not present

## 2016-05-25 DIAGNOSIS — M1991 Primary osteoarthritis, unspecified site: Secondary | ICD-10-CM | POA: Diagnosis not present

## 2016-05-25 DIAGNOSIS — G309 Alzheimer's disease, unspecified: Secondary | ICD-10-CM | POA: Diagnosis not present

## 2016-05-25 DIAGNOSIS — R251 Tremor, unspecified: Secondary | ICD-10-CM | POA: Diagnosis not present

## 2016-05-26 DIAGNOSIS — I10 Essential (primary) hypertension: Secondary | ICD-10-CM | POA: Diagnosis not present

## 2016-05-26 DIAGNOSIS — G309 Alzheimer's disease, unspecified: Secondary | ICD-10-CM | POA: Diagnosis not present

## 2016-05-26 DIAGNOSIS — R279 Unspecified lack of coordination: Secondary | ICD-10-CM | POA: Diagnosis not present

## 2016-05-26 DIAGNOSIS — D649 Anemia, unspecified: Secondary | ICD-10-CM | POA: Diagnosis not present

## 2016-05-26 DIAGNOSIS — M1991 Primary osteoarthritis, unspecified site: Secondary | ICD-10-CM | POA: Diagnosis not present

## 2016-05-26 DIAGNOSIS — J449 Chronic obstructive pulmonary disease, unspecified: Secondary | ICD-10-CM | POA: Diagnosis not present

## 2016-05-26 DIAGNOSIS — S91302D Unspecified open wound, left foot, subsequent encounter: Secondary | ICD-10-CM | POA: Diagnosis not present

## 2016-05-26 DIAGNOSIS — J9612 Chronic respiratory failure with hypercapnia: Secondary | ICD-10-CM | POA: Diagnosis not present

## 2016-05-26 DIAGNOSIS — Z4800 Encounter for change or removal of nonsurgical wound dressing: Secondary | ICD-10-CM | POA: Diagnosis not present

## 2016-05-26 DIAGNOSIS — R251 Tremor, unspecified: Secondary | ICD-10-CM | POA: Diagnosis not present

## 2016-05-27 DIAGNOSIS — R251 Tremor, unspecified: Secondary | ICD-10-CM | POA: Diagnosis not present

## 2016-05-27 DIAGNOSIS — R279 Unspecified lack of coordination: Secondary | ICD-10-CM | POA: Diagnosis not present

## 2016-05-27 DIAGNOSIS — G309 Alzheimer's disease, unspecified: Secondary | ICD-10-CM | POA: Diagnosis not present

## 2016-05-27 DIAGNOSIS — J449 Chronic obstructive pulmonary disease, unspecified: Secondary | ICD-10-CM | POA: Diagnosis not present

## 2016-05-27 DIAGNOSIS — I10 Essential (primary) hypertension: Secondary | ICD-10-CM | POA: Diagnosis not present

## 2016-05-27 DIAGNOSIS — Z4800 Encounter for change or removal of nonsurgical wound dressing: Secondary | ICD-10-CM | POA: Diagnosis not present

## 2016-05-27 DIAGNOSIS — M1991 Primary osteoarthritis, unspecified site: Secondary | ICD-10-CM | POA: Diagnosis not present

## 2016-05-27 DIAGNOSIS — S91302D Unspecified open wound, left foot, subsequent encounter: Secondary | ICD-10-CM | POA: Diagnosis not present

## 2016-05-27 DIAGNOSIS — J9612 Chronic respiratory failure with hypercapnia: Secondary | ICD-10-CM | POA: Diagnosis not present

## 2016-05-27 DIAGNOSIS — D649 Anemia, unspecified: Secondary | ICD-10-CM | POA: Diagnosis not present

## 2016-05-31 DIAGNOSIS — J449 Chronic obstructive pulmonary disease, unspecified: Secondary | ICD-10-CM | POA: Diagnosis not present

## 2016-05-31 DIAGNOSIS — I10 Essential (primary) hypertension: Secondary | ICD-10-CM | POA: Diagnosis not present

## 2016-05-31 DIAGNOSIS — J9612 Chronic respiratory failure with hypercapnia: Secondary | ICD-10-CM | POA: Diagnosis not present

## 2016-05-31 DIAGNOSIS — R251 Tremor, unspecified: Secondary | ICD-10-CM | POA: Diagnosis not present

## 2016-05-31 DIAGNOSIS — D649 Anemia, unspecified: Secondary | ICD-10-CM | POA: Diagnosis not present

## 2016-05-31 DIAGNOSIS — Z4800 Encounter for change or removal of nonsurgical wound dressing: Secondary | ICD-10-CM | POA: Diagnosis not present

## 2016-05-31 DIAGNOSIS — G309 Alzheimer's disease, unspecified: Secondary | ICD-10-CM | POA: Diagnosis not present

## 2016-05-31 DIAGNOSIS — S91302D Unspecified open wound, left foot, subsequent encounter: Secondary | ICD-10-CM | POA: Diagnosis not present

## 2016-05-31 DIAGNOSIS — M1991 Primary osteoarthritis, unspecified site: Secondary | ICD-10-CM | POA: Diagnosis not present

## 2016-05-31 DIAGNOSIS — R279 Unspecified lack of coordination: Secondary | ICD-10-CM | POA: Diagnosis not present

## 2016-06-01 ENCOUNTER — Telehealth: Payer: Self-pay | Admitting: Family Medicine

## 2016-06-01 DIAGNOSIS — E559 Vitamin D deficiency, unspecified: Secondary | ICD-10-CM | POA: Diagnosis not present

## 2016-06-01 DIAGNOSIS — I10 Essential (primary) hypertension: Secondary | ICD-10-CM | POA: Diagnosis not present

## 2016-06-01 NOTE — Telephone Encounter (Signed)
Contacted patient to schedule medicare wellness appointment and was informed by grandson patient is currently admitted to [place], [place] declined appointment.

## 2016-06-02 DIAGNOSIS — I10 Essential (primary) hypertension: Secondary | ICD-10-CM | POA: Diagnosis not present

## 2016-06-02 DIAGNOSIS — R251 Tremor, unspecified: Secondary | ICD-10-CM | POA: Diagnosis not present

## 2016-06-02 DIAGNOSIS — J9612 Chronic respiratory failure with hypercapnia: Secondary | ICD-10-CM | POA: Diagnosis not present

## 2016-06-02 DIAGNOSIS — G309 Alzheimer's disease, unspecified: Secondary | ICD-10-CM | POA: Diagnosis not present

## 2016-06-02 DIAGNOSIS — R279 Unspecified lack of coordination: Secondary | ICD-10-CM | POA: Diagnosis not present

## 2016-06-02 DIAGNOSIS — J449 Chronic obstructive pulmonary disease, unspecified: Secondary | ICD-10-CM | POA: Diagnosis not present

## 2016-06-02 DIAGNOSIS — S91302D Unspecified open wound, left foot, subsequent encounter: Secondary | ICD-10-CM | POA: Diagnosis not present

## 2016-06-02 DIAGNOSIS — D649 Anemia, unspecified: Secondary | ICD-10-CM | POA: Diagnosis not present

## 2016-06-02 DIAGNOSIS — M1991 Primary osteoarthritis, unspecified site: Secondary | ICD-10-CM | POA: Diagnosis not present

## 2016-06-02 DIAGNOSIS — Z4800 Encounter for change or removal of nonsurgical wound dressing: Secondary | ICD-10-CM | POA: Diagnosis not present

## 2016-06-03 DIAGNOSIS — J449 Chronic obstructive pulmonary disease, unspecified: Secondary | ICD-10-CM | POA: Diagnosis not present

## 2016-06-03 DIAGNOSIS — Z0000 Encounter for general adult medical examination without abnormal findings: Secondary | ICD-10-CM | POA: Diagnosis not present

## 2016-06-04 DIAGNOSIS — S91302D Unspecified open wound, left foot, subsequent encounter: Secondary | ICD-10-CM | POA: Diagnosis not present

## 2016-06-04 DIAGNOSIS — Z4800 Encounter for change or removal of nonsurgical wound dressing: Secondary | ICD-10-CM | POA: Diagnosis not present

## 2016-06-04 DIAGNOSIS — J449 Chronic obstructive pulmonary disease, unspecified: Secondary | ICD-10-CM | POA: Diagnosis not present

## 2016-06-04 DIAGNOSIS — M1991 Primary osteoarthritis, unspecified site: Secondary | ICD-10-CM | POA: Diagnosis not present

## 2016-06-04 DIAGNOSIS — R279 Unspecified lack of coordination: Secondary | ICD-10-CM | POA: Diagnosis not present

## 2016-06-04 DIAGNOSIS — R251 Tremor, unspecified: Secondary | ICD-10-CM | POA: Diagnosis not present

## 2016-06-04 DIAGNOSIS — D649 Anemia, unspecified: Secondary | ICD-10-CM | POA: Diagnosis not present

## 2016-06-04 DIAGNOSIS — G309 Alzheimer's disease, unspecified: Secondary | ICD-10-CM | POA: Diagnosis not present

## 2016-06-04 DIAGNOSIS — I10 Essential (primary) hypertension: Secondary | ICD-10-CM | POA: Diagnosis not present

## 2016-06-04 DIAGNOSIS — J9612 Chronic respiratory failure with hypercapnia: Secondary | ICD-10-CM | POA: Diagnosis not present

## 2016-06-07 DIAGNOSIS — R279 Unspecified lack of coordination: Secondary | ICD-10-CM | POA: Diagnosis not present

## 2016-06-07 DIAGNOSIS — G309 Alzheimer's disease, unspecified: Secondary | ICD-10-CM | POA: Diagnosis not present

## 2016-06-07 DIAGNOSIS — D649 Anemia, unspecified: Secondary | ICD-10-CM | POA: Diagnosis not present

## 2016-06-07 DIAGNOSIS — I10 Essential (primary) hypertension: Secondary | ICD-10-CM | POA: Diagnosis not present

## 2016-06-07 DIAGNOSIS — Z4800 Encounter for change or removal of nonsurgical wound dressing: Secondary | ICD-10-CM | POA: Diagnosis not present

## 2016-06-07 DIAGNOSIS — S91302D Unspecified open wound, left foot, subsequent encounter: Secondary | ICD-10-CM | POA: Diagnosis not present

## 2016-06-07 DIAGNOSIS — J449 Chronic obstructive pulmonary disease, unspecified: Secondary | ICD-10-CM | POA: Diagnosis not present

## 2016-06-07 DIAGNOSIS — M1991 Primary osteoarthritis, unspecified site: Secondary | ICD-10-CM | POA: Diagnosis not present

## 2016-06-07 DIAGNOSIS — R251 Tremor, unspecified: Secondary | ICD-10-CM | POA: Diagnosis not present

## 2016-06-07 DIAGNOSIS — J9612 Chronic respiratory failure with hypercapnia: Secondary | ICD-10-CM | POA: Diagnosis not present

## 2016-06-14 DIAGNOSIS — Z87891 Personal history of nicotine dependence: Secondary | ICD-10-CM | POA: Diagnosis not present

## 2016-06-14 DIAGNOSIS — I10 Essential (primary) hypertension: Secondary | ICD-10-CM | POA: Diagnosis not present

## 2016-06-14 DIAGNOSIS — D649 Anemia, unspecified: Secondary | ICD-10-CM | POA: Diagnosis not present

## 2016-06-14 DIAGNOSIS — Z9181 History of falling: Secondary | ICD-10-CM | POA: Diagnosis not present

## 2016-06-14 DIAGNOSIS — Z7982 Long term (current) use of aspirin: Secondary | ICD-10-CM | POA: Diagnosis not present

## 2016-06-14 DIAGNOSIS — S91302D Unspecified open wound, left foot, subsequent encounter: Secondary | ICD-10-CM | POA: Diagnosis not present

## 2016-06-14 DIAGNOSIS — R251 Tremor, unspecified: Secondary | ICD-10-CM | POA: Diagnosis not present

## 2016-06-14 DIAGNOSIS — G309 Alzheimer's disease, unspecified: Secondary | ICD-10-CM | POA: Diagnosis not present

## 2016-06-14 DIAGNOSIS — J9612 Chronic respiratory failure with hypercapnia: Secondary | ICD-10-CM | POA: Diagnosis not present

## 2016-06-14 DIAGNOSIS — M1991 Primary osteoarthritis, unspecified site: Secondary | ICD-10-CM | POA: Diagnosis not present

## 2016-06-14 DIAGNOSIS — J449 Chronic obstructive pulmonary disease, unspecified: Secondary | ICD-10-CM | POA: Diagnosis not present

## 2016-06-15 DIAGNOSIS — R599 Enlarged lymph nodes, unspecified: Secondary | ICD-10-CM | POA: Diagnosis not present

## 2016-06-15 DIAGNOSIS — R918 Other nonspecific abnormal finding of lung field: Secondary | ICD-10-CM | POA: Diagnosis not present

## 2016-06-15 DIAGNOSIS — J984 Other disorders of lung: Secondary | ICD-10-CM | POA: Diagnosis not present

## 2016-06-16 DIAGNOSIS — S91302D Unspecified open wound, left foot, subsequent encounter: Secondary | ICD-10-CM | POA: Diagnosis not present

## 2016-06-16 DIAGNOSIS — D649 Anemia, unspecified: Secondary | ICD-10-CM | POA: Diagnosis not present

## 2016-06-16 DIAGNOSIS — R251 Tremor, unspecified: Secondary | ICD-10-CM | POA: Diagnosis not present

## 2016-06-16 DIAGNOSIS — Z9181 History of falling: Secondary | ICD-10-CM | POA: Diagnosis not present

## 2016-06-16 DIAGNOSIS — J449 Chronic obstructive pulmonary disease, unspecified: Secondary | ICD-10-CM | POA: Diagnosis not present

## 2016-06-16 DIAGNOSIS — J9612 Chronic respiratory failure with hypercapnia: Secondary | ICD-10-CM | POA: Diagnosis not present

## 2016-06-16 DIAGNOSIS — G309 Alzheimer's disease, unspecified: Secondary | ICD-10-CM | POA: Diagnosis not present

## 2016-06-16 DIAGNOSIS — Z87891 Personal history of nicotine dependence: Secondary | ICD-10-CM | POA: Diagnosis not present

## 2016-06-16 DIAGNOSIS — Z7982 Long term (current) use of aspirin: Secondary | ICD-10-CM | POA: Diagnosis not present

## 2016-06-16 DIAGNOSIS — I10 Essential (primary) hypertension: Secondary | ICD-10-CM | POA: Diagnosis not present

## 2016-06-16 DIAGNOSIS — M1991 Primary osteoarthritis, unspecified site: Secondary | ICD-10-CM | POA: Diagnosis not present

## 2016-06-18 DIAGNOSIS — S91302D Unspecified open wound, left foot, subsequent encounter: Secondary | ICD-10-CM | POA: Diagnosis not present

## 2016-06-18 DIAGNOSIS — K210 Gastro-esophageal reflux disease with esophagitis: Secondary | ICD-10-CM | POA: Diagnosis not present

## 2016-06-18 DIAGNOSIS — Z87891 Personal history of nicotine dependence: Secondary | ICD-10-CM | POA: Diagnosis not present

## 2016-06-18 DIAGNOSIS — R251 Tremor, unspecified: Secondary | ICD-10-CM | POA: Diagnosis not present

## 2016-06-18 DIAGNOSIS — M1991 Primary osteoarthritis, unspecified site: Secondary | ICD-10-CM | POA: Diagnosis not present

## 2016-06-18 DIAGNOSIS — Z9181 History of falling: Secondary | ICD-10-CM | POA: Diagnosis not present

## 2016-06-18 DIAGNOSIS — L89311 Pressure ulcer of right buttock, stage 1: Secondary | ICD-10-CM | POA: Diagnosis not present

## 2016-06-18 DIAGNOSIS — G309 Alzheimer's disease, unspecified: Secondary | ICD-10-CM | POA: Diagnosis not present

## 2016-06-18 DIAGNOSIS — J9612 Chronic respiratory failure with hypercapnia: Secondary | ICD-10-CM | POA: Diagnosis not present

## 2016-06-18 DIAGNOSIS — J449 Chronic obstructive pulmonary disease, unspecified: Secondary | ICD-10-CM | POA: Diagnosis not present

## 2016-06-18 DIAGNOSIS — I10 Essential (primary) hypertension: Secondary | ICD-10-CM | POA: Diagnosis not present

## 2016-06-18 DIAGNOSIS — D649 Anemia, unspecified: Secondary | ICD-10-CM | POA: Diagnosis not present

## 2016-06-18 DIAGNOSIS — K649 Unspecified hemorrhoids: Secondary | ICD-10-CM | POA: Diagnosis not present

## 2016-06-18 DIAGNOSIS — R918 Other nonspecific abnormal finding of lung field: Secondary | ICD-10-CM | POA: Diagnosis not present

## 2016-06-18 DIAGNOSIS — Z7982 Long term (current) use of aspirin: Secondary | ICD-10-CM | POA: Diagnosis not present

## 2016-06-21 DIAGNOSIS — M1991 Primary osteoarthritis, unspecified site: Secondary | ICD-10-CM | POA: Diagnosis not present

## 2016-06-21 DIAGNOSIS — S91302D Unspecified open wound, left foot, subsequent encounter: Secondary | ICD-10-CM | POA: Diagnosis not present

## 2016-06-21 DIAGNOSIS — J449 Chronic obstructive pulmonary disease, unspecified: Secondary | ICD-10-CM | POA: Diagnosis not present

## 2016-06-21 DIAGNOSIS — Z87891 Personal history of nicotine dependence: Secondary | ICD-10-CM | POA: Diagnosis not present

## 2016-06-21 DIAGNOSIS — I10 Essential (primary) hypertension: Secondary | ICD-10-CM | POA: Diagnosis not present

## 2016-06-21 DIAGNOSIS — G309 Alzheimer's disease, unspecified: Secondary | ICD-10-CM | POA: Diagnosis not present

## 2016-06-21 DIAGNOSIS — R251 Tremor, unspecified: Secondary | ICD-10-CM | POA: Diagnosis not present

## 2016-06-21 DIAGNOSIS — D649 Anemia, unspecified: Secondary | ICD-10-CM | POA: Diagnosis not present

## 2016-06-21 DIAGNOSIS — Z9181 History of falling: Secondary | ICD-10-CM | POA: Diagnosis not present

## 2016-06-21 DIAGNOSIS — J9612 Chronic respiratory failure with hypercapnia: Secondary | ICD-10-CM | POA: Diagnosis not present

## 2016-06-21 DIAGNOSIS — Z7982 Long term (current) use of aspirin: Secondary | ICD-10-CM | POA: Diagnosis not present

## 2016-06-22 DIAGNOSIS — Z9181 History of falling: Secondary | ICD-10-CM | POA: Diagnosis not present

## 2016-06-22 DIAGNOSIS — R251 Tremor, unspecified: Secondary | ICD-10-CM | POA: Diagnosis not present

## 2016-06-22 DIAGNOSIS — D649 Anemia, unspecified: Secondary | ICD-10-CM | POA: Diagnosis not present

## 2016-06-22 DIAGNOSIS — S91302D Unspecified open wound, left foot, subsequent encounter: Secondary | ICD-10-CM | POA: Diagnosis not present

## 2016-06-22 DIAGNOSIS — M25552 Pain in left hip: Secondary | ICD-10-CM | POA: Diagnosis not present

## 2016-06-22 DIAGNOSIS — J449 Chronic obstructive pulmonary disease, unspecified: Secondary | ICD-10-CM | POA: Diagnosis not present

## 2016-06-22 DIAGNOSIS — I10 Essential (primary) hypertension: Secondary | ICD-10-CM | POA: Diagnosis not present

## 2016-06-22 DIAGNOSIS — J9612 Chronic respiratory failure with hypercapnia: Secondary | ICD-10-CM | POA: Diagnosis not present

## 2016-06-22 DIAGNOSIS — G309 Alzheimer's disease, unspecified: Secondary | ICD-10-CM | POA: Diagnosis not present

## 2016-06-22 DIAGNOSIS — Z87891 Personal history of nicotine dependence: Secondary | ICD-10-CM | POA: Diagnosis not present

## 2016-06-22 DIAGNOSIS — Z7982 Long term (current) use of aspirin: Secondary | ICD-10-CM | POA: Diagnosis not present

## 2016-06-22 DIAGNOSIS — M1991 Primary osteoarthritis, unspecified site: Secondary | ICD-10-CM | POA: Diagnosis not present

## 2016-06-23 DIAGNOSIS — G309 Alzheimer's disease, unspecified: Secondary | ICD-10-CM | POA: Diagnosis not present

## 2016-06-23 DIAGNOSIS — Z7982 Long term (current) use of aspirin: Secondary | ICD-10-CM | POA: Diagnosis not present

## 2016-06-23 DIAGNOSIS — J449 Chronic obstructive pulmonary disease, unspecified: Secondary | ICD-10-CM | POA: Diagnosis not present

## 2016-06-23 DIAGNOSIS — Z87891 Personal history of nicotine dependence: Secondary | ICD-10-CM | POA: Diagnosis not present

## 2016-06-23 DIAGNOSIS — Z9181 History of falling: Secondary | ICD-10-CM | POA: Diagnosis not present

## 2016-06-23 DIAGNOSIS — R251 Tremor, unspecified: Secondary | ICD-10-CM | POA: Diagnosis not present

## 2016-06-23 DIAGNOSIS — S91302D Unspecified open wound, left foot, subsequent encounter: Secondary | ICD-10-CM | POA: Diagnosis not present

## 2016-06-23 DIAGNOSIS — J9612 Chronic respiratory failure with hypercapnia: Secondary | ICD-10-CM | POA: Diagnosis not present

## 2016-06-23 DIAGNOSIS — I10 Essential (primary) hypertension: Secondary | ICD-10-CM | POA: Diagnosis not present

## 2016-06-23 DIAGNOSIS — D649 Anemia, unspecified: Secondary | ICD-10-CM | POA: Diagnosis not present

## 2016-06-23 DIAGNOSIS — M1991 Primary osteoarthritis, unspecified site: Secondary | ICD-10-CM | POA: Diagnosis not present

## 2016-06-25 DIAGNOSIS — S91302D Unspecified open wound, left foot, subsequent encounter: Secondary | ICD-10-CM | POA: Diagnosis not present

## 2016-06-25 DIAGNOSIS — Z9181 History of falling: Secondary | ICD-10-CM | POA: Diagnosis not present

## 2016-06-25 DIAGNOSIS — I10 Essential (primary) hypertension: Secondary | ICD-10-CM | POA: Diagnosis not present

## 2016-06-25 DIAGNOSIS — M1991 Primary osteoarthritis, unspecified site: Secondary | ICD-10-CM | POA: Diagnosis not present

## 2016-06-25 DIAGNOSIS — Z7982 Long term (current) use of aspirin: Secondary | ICD-10-CM | POA: Diagnosis not present

## 2016-06-25 DIAGNOSIS — G309 Alzheimer's disease, unspecified: Secondary | ICD-10-CM | POA: Diagnosis not present

## 2016-06-25 DIAGNOSIS — Z87891 Personal history of nicotine dependence: Secondary | ICD-10-CM | POA: Diagnosis not present

## 2016-06-25 DIAGNOSIS — J449 Chronic obstructive pulmonary disease, unspecified: Secondary | ICD-10-CM | POA: Diagnosis not present

## 2016-06-25 DIAGNOSIS — D649 Anemia, unspecified: Secondary | ICD-10-CM | POA: Diagnosis not present

## 2016-06-25 DIAGNOSIS — J9612 Chronic respiratory failure with hypercapnia: Secondary | ICD-10-CM | POA: Diagnosis not present

## 2016-06-25 DIAGNOSIS — R251 Tremor, unspecified: Secondary | ICD-10-CM | POA: Diagnosis not present

## 2016-06-27 DIAGNOSIS — J449 Chronic obstructive pulmonary disease, unspecified: Secondary | ICD-10-CM | POA: Diagnosis not present

## 2016-06-28 DIAGNOSIS — Z87891 Personal history of nicotine dependence: Secondary | ICD-10-CM | POA: Diagnosis not present

## 2016-06-28 DIAGNOSIS — Z9181 History of falling: Secondary | ICD-10-CM | POA: Diagnosis not present

## 2016-06-28 DIAGNOSIS — I10 Essential (primary) hypertension: Secondary | ICD-10-CM | POA: Diagnosis not present

## 2016-06-28 DIAGNOSIS — S91302D Unspecified open wound, left foot, subsequent encounter: Secondary | ICD-10-CM | POA: Diagnosis not present

## 2016-06-28 DIAGNOSIS — R251 Tremor, unspecified: Secondary | ICD-10-CM | POA: Diagnosis not present

## 2016-06-28 DIAGNOSIS — M1991 Primary osteoarthritis, unspecified site: Secondary | ICD-10-CM | POA: Diagnosis not present

## 2016-06-28 DIAGNOSIS — D649 Anemia, unspecified: Secondary | ICD-10-CM | POA: Diagnosis not present

## 2016-06-28 DIAGNOSIS — J9612 Chronic respiratory failure with hypercapnia: Secondary | ICD-10-CM | POA: Diagnosis not present

## 2016-06-28 DIAGNOSIS — Z7982 Long term (current) use of aspirin: Secondary | ICD-10-CM | POA: Diagnosis not present

## 2016-06-28 DIAGNOSIS — J449 Chronic obstructive pulmonary disease, unspecified: Secondary | ICD-10-CM | POA: Diagnosis not present

## 2016-06-28 DIAGNOSIS — G309 Alzheimer's disease, unspecified: Secondary | ICD-10-CM | POA: Diagnosis not present

## 2016-06-29 DIAGNOSIS — J449 Chronic obstructive pulmonary disease, unspecified: Secondary | ICD-10-CM | POA: Diagnosis not present

## 2016-06-29 DIAGNOSIS — G309 Alzheimer's disease, unspecified: Secondary | ICD-10-CM | POA: Diagnosis not present

## 2016-06-29 DIAGNOSIS — R251 Tremor, unspecified: Secondary | ICD-10-CM | POA: Diagnosis not present

## 2016-06-29 DIAGNOSIS — S91302D Unspecified open wound, left foot, subsequent encounter: Secondary | ICD-10-CM | POA: Diagnosis not present

## 2016-06-29 DIAGNOSIS — J9612 Chronic respiratory failure with hypercapnia: Secondary | ICD-10-CM | POA: Diagnosis not present

## 2016-06-29 DIAGNOSIS — Z87891 Personal history of nicotine dependence: Secondary | ICD-10-CM | POA: Diagnosis not present

## 2016-06-29 DIAGNOSIS — I10 Essential (primary) hypertension: Secondary | ICD-10-CM | POA: Diagnosis not present

## 2016-06-29 DIAGNOSIS — M1991 Primary osteoarthritis, unspecified site: Secondary | ICD-10-CM | POA: Diagnosis not present

## 2016-06-29 DIAGNOSIS — D649 Anemia, unspecified: Secondary | ICD-10-CM | POA: Diagnosis not present

## 2016-06-29 DIAGNOSIS — Z7982 Long term (current) use of aspirin: Secondary | ICD-10-CM | POA: Diagnosis not present

## 2016-06-29 DIAGNOSIS — Z9181 History of falling: Secondary | ICD-10-CM | POA: Diagnosis not present

## 2016-07-01 DIAGNOSIS — Z79899 Other long term (current) drug therapy: Secondary | ICD-10-CM | POA: Diagnosis not present

## 2016-07-01 DIAGNOSIS — K210 Gastro-esophageal reflux disease with esophagitis: Secondary | ICD-10-CM | POA: Diagnosis not present

## 2016-07-01 DIAGNOSIS — G309 Alzheimer's disease, unspecified: Secondary | ICD-10-CM | POA: Diagnosis not present

## 2016-07-01 DIAGNOSIS — Z9181 History of falling: Secondary | ICD-10-CM | POA: Diagnosis not present

## 2016-07-01 DIAGNOSIS — S91302D Unspecified open wound, left foot, subsequent encounter: Secondary | ICD-10-CM | POA: Diagnosis not present

## 2016-07-01 DIAGNOSIS — Z7982 Long term (current) use of aspirin: Secondary | ICD-10-CM | POA: Diagnosis not present

## 2016-07-01 DIAGNOSIS — D649 Anemia, unspecified: Secondary | ICD-10-CM | POA: Diagnosis not present

## 2016-07-01 DIAGNOSIS — E876 Hypokalemia: Secondary | ICD-10-CM | POA: Diagnosis not present

## 2016-07-01 DIAGNOSIS — I10 Essential (primary) hypertension: Secondary | ICD-10-CM | POA: Diagnosis not present

## 2016-07-01 DIAGNOSIS — K649 Unspecified hemorrhoids: Secondary | ICD-10-CM | POA: Diagnosis not present

## 2016-07-01 DIAGNOSIS — M1991 Primary osteoarthritis, unspecified site: Secondary | ICD-10-CM | POA: Diagnosis not present

## 2016-07-01 DIAGNOSIS — J9612 Chronic respiratory failure with hypercapnia: Secondary | ICD-10-CM | POA: Diagnosis not present

## 2016-07-01 DIAGNOSIS — R251 Tremor, unspecified: Secondary | ICD-10-CM | POA: Diagnosis not present

## 2016-07-01 DIAGNOSIS — R918 Other nonspecific abnormal finding of lung field: Secondary | ICD-10-CM | POA: Diagnosis not present

## 2016-07-01 DIAGNOSIS — Z87891 Personal history of nicotine dependence: Secondary | ICD-10-CM | POA: Diagnosis not present

## 2016-07-01 DIAGNOSIS — J449 Chronic obstructive pulmonary disease, unspecified: Secondary | ICD-10-CM | POA: Diagnosis not present

## 2016-07-01 DIAGNOSIS — L89311 Pressure ulcer of right buttock, stage 1: Secondary | ICD-10-CM | POA: Diagnosis not present

## 2016-07-02 DIAGNOSIS — S91302D Unspecified open wound, left foot, subsequent encounter: Secondary | ICD-10-CM | POA: Diagnosis not present

## 2016-07-02 DIAGNOSIS — J449 Chronic obstructive pulmonary disease, unspecified: Secondary | ICD-10-CM | POA: Diagnosis not present

## 2016-07-02 DIAGNOSIS — J9612 Chronic respiratory failure with hypercapnia: Secondary | ICD-10-CM | POA: Diagnosis not present

## 2016-07-02 DIAGNOSIS — I10 Essential (primary) hypertension: Secondary | ICD-10-CM | POA: Diagnosis not present

## 2016-07-02 DIAGNOSIS — Z7982 Long term (current) use of aspirin: Secondary | ICD-10-CM | POA: Diagnosis not present

## 2016-07-02 DIAGNOSIS — M1991 Primary osteoarthritis, unspecified site: Secondary | ICD-10-CM | POA: Diagnosis not present

## 2016-07-02 DIAGNOSIS — G309 Alzheimer's disease, unspecified: Secondary | ICD-10-CM | POA: Diagnosis not present

## 2016-07-02 DIAGNOSIS — Z9181 History of falling: Secondary | ICD-10-CM | POA: Diagnosis not present

## 2016-07-02 DIAGNOSIS — D649 Anemia, unspecified: Secondary | ICD-10-CM | POA: Diagnosis not present

## 2016-07-02 DIAGNOSIS — R251 Tremor, unspecified: Secondary | ICD-10-CM | POA: Diagnosis not present

## 2016-07-02 DIAGNOSIS — Z87891 Personal history of nicotine dependence: Secondary | ICD-10-CM | POA: Diagnosis not present

## 2016-07-04 DIAGNOSIS — J449 Chronic obstructive pulmonary disease, unspecified: Secondary | ICD-10-CM | POA: Diagnosis not present

## 2016-07-05 DIAGNOSIS — M1991 Primary osteoarthritis, unspecified site: Secondary | ICD-10-CM | POA: Diagnosis not present

## 2016-07-05 DIAGNOSIS — G309 Alzheimer's disease, unspecified: Secondary | ICD-10-CM | POA: Diagnosis not present

## 2016-07-05 DIAGNOSIS — D649 Anemia, unspecified: Secondary | ICD-10-CM | POA: Diagnosis not present

## 2016-07-05 DIAGNOSIS — Z87891 Personal history of nicotine dependence: Secondary | ICD-10-CM | POA: Diagnosis not present

## 2016-07-05 DIAGNOSIS — I10 Essential (primary) hypertension: Secondary | ICD-10-CM | POA: Diagnosis not present

## 2016-07-05 DIAGNOSIS — R251 Tremor, unspecified: Secondary | ICD-10-CM | POA: Diagnosis not present

## 2016-07-05 DIAGNOSIS — J449 Chronic obstructive pulmonary disease, unspecified: Secondary | ICD-10-CM | POA: Diagnosis not present

## 2016-07-05 DIAGNOSIS — S91302D Unspecified open wound, left foot, subsequent encounter: Secondary | ICD-10-CM | POA: Diagnosis not present

## 2016-07-05 DIAGNOSIS — Z7982 Long term (current) use of aspirin: Secondary | ICD-10-CM | POA: Diagnosis not present

## 2016-07-05 DIAGNOSIS — Z9181 History of falling: Secondary | ICD-10-CM | POA: Diagnosis not present

## 2016-07-05 DIAGNOSIS — J9612 Chronic respiratory failure with hypercapnia: Secondary | ICD-10-CM | POA: Diagnosis not present

## 2016-07-06 DIAGNOSIS — G309 Alzheimer's disease, unspecified: Secondary | ICD-10-CM | POA: Diagnosis not present

## 2016-07-06 DIAGNOSIS — J449 Chronic obstructive pulmonary disease, unspecified: Secondary | ICD-10-CM | POA: Diagnosis not present

## 2016-07-06 DIAGNOSIS — S91302D Unspecified open wound, left foot, subsequent encounter: Secondary | ICD-10-CM | POA: Diagnosis not present

## 2016-07-06 DIAGNOSIS — Z7982 Long term (current) use of aspirin: Secondary | ICD-10-CM | POA: Diagnosis not present

## 2016-07-06 DIAGNOSIS — Z9181 History of falling: Secondary | ICD-10-CM | POA: Diagnosis not present

## 2016-07-06 DIAGNOSIS — R251 Tremor, unspecified: Secondary | ICD-10-CM | POA: Diagnosis not present

## 2016-07-06 DIAGNOSIS — M1991 Primary osteoarthritis, unspecified site: Secondary | ICD-10-CM | POA: Diagnosis not present

## 2016-07-06 DIAGNOSIS — J9612 Chronic respiratory failure with hypercapnia: Secondary | ICD-10-CM | POA: Diagnosis not present

## 2016-07-06 DIAGNOSIS — D649 Anemia, unspecified: Secondary | ICD-10-CM | POA: Diagnosis not present

## 2016-07-06 DIAGNOSIS — I10 Essential (primary) hypertension: Secondary | ICD-10-CM | POA: Diagnosis not present

## 2016-07-06 DIAGNOSIS — Z87891 Personal history of nicotine dependence: Secondary | ICD-10-CM | POA: Diagnosis not present

## 2016-07-07 DIAGNOSIS — S91302D Unspecified open wound, left foot, subsequent encounter: Secondary | ICD-10-CM | POA: Diagnosis not present

## 2016-07-07 DIAGNOSIS — D649 Anemia, unspecified: Secondary | ICD-10-CM | POA: Diagnosis not present

## 2016-07-07 DIAGNOSIS — G309 Alzheimer's disease, unspecified: Secondary | ICD-10-CM | POA: Diagnosis not present

## 2016-07-07 DIAGNOSIS — J449 Chronic obstructive pulmonary disease, unspecified: Secondary | ICD-10-CM | POA: Diagnosis not present

## 2016-07-07 DIAGNOSIS — M1991 Primary osteoarthritis, unspecified site: Secondary | ICD-10-CM | POA: Diagnosis not present

## 2016-07-07 DIAGNOSIS — Z7982 Long term (current) use of aspirin: Secondary | ICD-10-CM | POA: Diagnosis not present

## 2016-07-07 DIAGNOSIS — Z87891 Personal history of nicotine dependence: Secondary | ICD-10-CM | POA: Diagnosis not present

## 2016-07-07 DIAGNOSIS — I10 Essential (primary) hypertension: Secondary | ICD-10-CM | POA: Diagnosis not present

## 2016-07-07 DIAGNOSIS — Z9181 History of falling: Secondary | ICD-10-CM | POA: Diagnosis not present

## 2016-07-07 DIAGNOSIS — R251 Tremor, unspecified: Secondary | ICD-10-CM | POA: Diagnosis not present

## 2016-07-07 DIAGNOSIS — J9612 Chronic respiratory failure with hypercapnia: Secondary | ICD-10-CM | POA: Diagnosis not present

## 2016-07-08 DIAGNOSIS — J449 Chronic obstructive pulmonary disease, unspecified: Secondary | ICD-10-CM | POA: Diagnosis not present

## 2016-07-08 DIAGNOSIS — I10 Essential (primary) hypertension: Secondary | ICD-10-CM | POA: Diagnosis not present

## 2016-07-08 DIAGNOSIS — D649 Anemia, unspecified: Secondary | ICD-10-CM | POA: Diagnosis not present

## 2016-07-08 DIAGNOSIS — M1991 Primary osteoarthritis, unspecified site: Secondary | ICD-10-CM | POA: Diagnosis not present

## 2016-07-08 DIAGNOSIS — Z7982 Long term (current) use of aspirin: Secondary | ICD-10-CM | POA: Diagnosis not present

## 2016-07-08 DIAGNOSIS — Z87891 Personal history of nicotine dependence: Secondary | ICD-10-CM | POA: Diagnosis not present

## 2016-07-08 DIAGNOSIS — S91302D Unspecified open wound, left foot, subsequent encounter: Secondary | ICD-10-CM | POA: Diagnosis not present

## 2016-07-08 DIAGNOSIS — G309 Alzheimer's disease, unspecified: Secondary | ICD-10-CM | POA: Diagnosis not present

## 2016-07-08 DIAGNOSIS — J9612 Chronic respiratory failure with hypercapnia: Secondary | ICD-10-CM | POA: Diagnosis not present

## 2016-07-08 DIAGNOSIS — R251 Tremor, unspecified: Secondary | ICD-10-CM | POA: Diagnosis not present

## 2016-07-08 DIAGNOSIS — Z9181 History of falling: Secondary | ICD-10-CM | POA: Diagnosis not present

## 2016-07-13 DIAGNOSIS — M1991 Primary osteoarthritis, unspecified site: Secondary | ICD-10-CM | POA: Diagnosis not present

## 2016-07-13 DIAGNOSIS — S91302D Unspecified open wound, left foot, subsequent encounter: Secondary | ICD-10-CM | POA: Diagnosis not present

## 2016-07-13 DIAGNOSIS — J9612 Chronic respiratory failure with hypercapnia: Secondary | ICD-10-CM | POA: Diagnosis not present

## 2016-07-13 DIAGNOSIS — Z87891 Personal history of nicotine dependence: Secondary | ICD-10-CM | POA: Diagnosis not present

## 2016-07-13 DIAGNOSIS — G309 Alzheimer's disease, unspecified: Secondary | ICD-10-CM | POA: Diagnosis not present

## 2016-07-13 DIAGNOSIS — Z7982 Long term (current) use of aspirin: Secondary | ICD-10-CM | POA: Diagnosis not present

## 2016-07-13 DIAGNOSIS — Z9181 History of falling: Secondary | ICD-10-CM | POA: Diagnosis not present

## 2016-07-13 DIAGNOSIS — I10 Essential (primary) hypertension: Secondary | ICD-10-CM | POA: Diagnosis not present

## 2016-07-13 DIAGNOSIS — J449 Chronic obstructive pulmonary disease, unspecified: Secondary | ICD-10-CM | POA: Diagnosis not present

## 2016-07-13 DIAGNOSIS — M25552 Pain in left hip: Secondary | ICD-10-CM | POA: Diagnosis not present

## 2016-07-13 DIAGNOSIS — R251 Tremor, unspecified: Secondary | ICD-10-CM | POA: Diagnosis not present

## 2016-07-13 DIAGNOSIS — D649 Anemia, unspecified: Secondary | ICD-10-CM | POA: Diagnosis not present

## 2016-07-14 DIAGNOSIS — Z9181 History of falling: Secondary | ICD-10-CM | POA: Diagnosis not present

## 2016-07-14 DIAGNOSIS — J449 Chronic obstructive pulmonary disease, unspecified: Secondary | ICD-10-CM | POA: Diagnosis not present

## 2016-07-14 DIAGNOSIS — S91302D Unspecified open wound, left foot, subsequent encounter: Secondary | ICD-10-CM | POA: Diagnosis not present

## 2016-07-14 DIAGNOSIS — M1991 Primary osteoarthritis, unspecified site: Secondary | ICD-10-CM | POA: Diagnosis not present

## 2016-07-14 DIAGNOSIS — I10 Essential (primary) hypertension: Secondary | ICD-10-CM | POA: Diagnosis not present

## 2016-07-14 DIAGNOSIS — Z7982 Long term (current) use of aspirin: Secondary | ICD-10-CM | POA: Diagnosis not present

## 2016-07-14 DIAGNOSIS — G309 Alzheimer's disease, unspecified: Secondary | ICD-10-CM | POA: Diagnosis not present

## 2016-07-14 DIAGNOSIS — D649 Anemia, unspecified: Secondary | ICD-10-CM | POA: Diagnosis not present

## 2016-07-14 DIAGNOSIS — R251 Tremor, unspecified: Secondary | ICD-10-CM | POA: Diagnosis not present

## 2016-07-14 DIAGNOSIS — Z87891 Personal history of nicotine dependence: Secondary | ICD-10-CM | POA: Diagnosis not present

## 2016-07-14 DIAGNOSIS — J9612 Chronic respiratory failure with hypercapnia: Secondary | ICD-10-CM | POA: Diagnosis not present

## 2016-07-15 DIAGNOSIS — I10 Essential (primary) hypertension: Secondary | ICD-10-CM | POA: Diagnosis not present

## 2016-07-15 DIAGNOSIS — Z7982 Long term (current) use of aspirin: Secondary | ICD-10-CM | POA: Diagnosis not present

## 2016-07-15 DIAGNOSIS — G309 Alzheimer's disease, unspecified: Secondary | ICD-10-CM | POA: Diagnosis not present

## 2016-07-15 DIAGNOSIS — J449 Chronic obstructive pulmonary disease, unspecified: Secondary | ICD-10-CM | POA: Diagnosis not present

## 2016-07-15 DIAGNOSIS — M1990 Unspecified osteoarthritis, unspecified site: Secondary | ICD-10-CM | POA: Diagnosis not present

## 2016-07-15 DIAGNOSIS — Z9181 History of falling: Secondary | ICD-10-CM | POA: Diagnosis not present

## 2016-07-15 DIAGNOSIS — Z87891 Personal history of nicotine dependence: Secondary | ICD-10-CM | POA: Diagnosis not present

## 2016-07-15 DIAGNOSIS — J9612 Chronic respiratory failure with hypercapnia: Secondary | ICD-10-CM | POA: Diagnosis not present

## 2016-07-15 DIAGNOSIS — R251 Tremor, unspecified: Secondary | ICD-10-CM | POA: Diagnosis not present

## 2016-07-15 DIAGNOSIS — S91302D Unspecified open wound, left foot, subsequent encounter: Secondary | ICD-10-CM | POA: Diagnosis not present

## 2016-07-15 DIAGNOSIS — M1991 Primary osteoarthritis, unspecified site: Secondary | ICD-10-CM | POA: Diagnosis not present

## 2016-07-15 DIAGNOSIS — D649 Anemia, unspecified: Secondary | ICD-10-CM | POA: Diagnosis not present

## 2016-07-15 DIAGNOSIS — G894 Chronic pain syndrome: Secondary | ICD-10-CM | POA: Diagnosis not present

## 2016-07-16 DIAGNOSIS — G309 Alzheimer's disease, unspecified: Secondary | ICD-10-CM | POA: Diagnosis not present

## 2016-07-16 DIAGNOSIS — Z87891 Personal history of nicotine dependence: Secondary | ICD-10-CM | POA: Diagnosis not present

## 2016-07-16 DIAGNOSIS — M1991 Primary osteoarthritis, unspecified site: Secondary | ICD-10-CM | POA: Diagnosis not present

## 2016-07-16 DIAGNOSIS — Z9181 History of falling: Secondary | ICD-10-CM | POA: Diagnosis not present

## 2016-07-16 DIAGNOSIS — D649 Anemia, unspecified: Secondary | ICD-10-CM | POA: Diagnosis not present

## 2016-07-16 DIAGNOSIS — J449 Chronic obstructive pulmonary disease, unspecified: Secondary | ICD-10-CM | POA: Diagnosis not present

## 2016-07-16 DIAGNOSIS — S91302D Unspecified open wound, left foot, subsequent encounter: Secondary | ICD-10-CM | POA: Diagnosis not present

## 2016-07-16 DIAGNOSIS — I10 Essential (primary) hypertension: Secondary | ICD-10-CM | POA: Diagnosis not present

## 2016-07-16 DIAGNOSIS — Z7982 Long term (current) use of aspirin: Secondary | ICD-10-CM | POA: Diagnosis not present

## 2016-07-16 DIAGNOSIS — R251 Tremor, unspecified: Secondary | ICD-10-CM | POA: Diagnosis not present

## 2016-07-16 DIAGNOSIS — J9612 Chronic respiratory failure with hypercapnia: Secondary | ICD-10-CM | POA: Diagnosis not present

## 2016-07-19 DIAGNOSIS — Z7982 Long term (current) use of aspirin: Secondary | ICD-10-CM | POA: Diagnosis not present

## 2016-07-19 DIAGNOSIS — S91302D Unspecified open wound, left foot, subsequent encounter: Secondary | ICD-10-CM | POA: Diagnosis not present

## 2016-07-19 DIAGNOSIS — G309 Alzheimer's disease, unspecified: Secondary | ICD-10-CM | POA: Diagnosis not present

## 2016-07-19 DIAGNOSIS — Z9181 History of falling: Secondary | ICD-10-CM | POA: Diagnosis not present

## 2016-07-19 DIAGNOSIS — J9612 Chronic respiratory failure with hypercapnia: Secondary | ICD-10-CM | POA: Diagnosis not present

## 2016-07-19 DIAGNOSIS — M1991 Primary osteoarthritis, unspecified site: Secondary | ICD-10-CM | POA: Diagnosis not present

## 2016-07-19 DIAGNOSIS — J449 Chronic obstructive pulmonary disease, unspecified: Secondary | ICD-10-CM | POA: Diagnosis not present

## 2016-07-19 DIAGNOSIS — R251 Tremor, unspecified: Secondary | ICD-10-CM | POA: Diagnosis not present

## 2016-07-19 DIAGNOSIS — D649 Anemia, unspecified: Secondary | ICD-10-CM | POA: Diagnosis not present

## 2016-07-19 DIAGNOSIS — Z87891 Personal history of nicotine dependence: Secondary | ICD-10-CM | POA: Diagnosis not present

## 2016-07-19 DIAGNOSIS — I10 Essential (primary) hypertension: Secondary | ICD-10-CM | POA: Diagnosis not present

## 2016-07-20 DIAGNOSIS — Z7982 Long term (current) use of aspirin: Secondary | ICD-10-CM | POA: Diagnosis not present

## 2016-07-20 DIAGNOSIS — S91302D Unspecified open wound, left foot, subsequent encounter: Secondary | ICD-10-CM | POA: Diagnosis not present

## 2016-07-20 DIAGNOSIS — Z87891 Personal history of nicotine dependence: Secondary | ICD-10-CM | POA: Diagnosis not present

## 2016-07-20 DIAGNOSIS — J449 Chronic obstructive pulmonary disease, unspecified: Secondary | ICD-10-CM | POA: Diagnosis not present

## 2016-07-20 DIAGNOSIS — Z9181 History of falling: Secondary | ICD-10-CM | POA: Diagnosis not present

## 2016-07-20 DIAGNOSIS — D649 Anemia, unspecified: Secondary | ICD-10-CM | POA: Diagnosis not present

## 2016-07-20 DIAGNOSIS — M1991 Primary osteoarthritis, unspecified site: Secondary | ICD-10-CM | POA: Diagnosis not present

## 2016-07-20 DIAGNOSIS — J9612 Chronic respiratory failure with hypercapnia: Secondary | ICD-10-CM | POA: Diagnosis not present

## 2016-07-20 DIAGNOSIS — G309 Alzheimer's disease, unspecified: Secondary | ICD-10-CM | POA: Diagnosis not present

## 2016-07-20 DIAGNOSIS — R251 Tremor, unspecified: Secondary | ICD-10-CM | POA: Diagnosis not present

## 2016-07-20 DIAGNOSIS — I10 Essential (primary) hypertension: Secondary | ICD-10-CM | POA: Diagnosis not present

## 2016-07-22 DIAGNOSIS — I10 Essential (primary) hypertension: Secondary | ICD-10-CM | POA: Diagnosis not present

## 2016-07-22 DIAGNOSIS — J449 Chronic obstructive pulmonary disease, unspecified: Secondary | ICD-10-CM | POA: Diagnosis not present

## 2016-07-22 DIAGNOSIS — Z9181 History of falling: Secondary | ICD-10-CM | POA: Diagnosis not present

## 2016-07-22 DIAGNOSIS — Z87891 Personal history of nicotine dependence: Secondary | ICD-10-CM | POA: Diagnosis not present

## 2016-07-22 DIAGNOSIS — S91302D Unspecified open wound, left foot, subsequent encounter: Secondary | ICD-10-CM | POA: Diagnosis not present

## 2016-07-22 DIAGNOSIS — D649 Anemia, unspecified: Secondary | ICD-10-CM | POA: Diagnosis not present

## 2016-07-22 DIAGNOSIS — G309 Alzheimer's disease, unspecified: Secondary | ICD-10-CM | POA: Diagnosis not present

## 2016-07-22 DIAGNOSIS — M1991 Primary osteoarthritis, unspecified site: Secondary | ICD-10-CM | POA: Diagnosis not present

## 2016-07-22 DIAGNOSIS — J9612 Chronic respiratory failure with hypercapnia: Secondary | ICD-10-CM | POA: Diagnosis not present

## 2016-07-22 DIAGNOSIS — Z7982 Long term (current) use of aspirin: Secondary | ICD-10-CM | POA: Diagnosis not present

## 2016-07-22 DIAGNOSIS — R251 Tremor, unspecified: Secondary | ICD-10-CM | POA: Diagnosis not present

## 2016-07-23 DIAGNOSIS — M1991 Primary osteoarthritis, unspecified site: Secondary | ICD-10-CM | POA: Diagnosis not present

## 2016-07-23 DIAGNOSIS — Z7982 Long term (current) use of aspirin: Secondary | ICD-10-CM | POA: Diagnosis not present

## 2016-07-23 DIAGNOSIS — G309 Alzheimer's disease, unspecified: Secondary | ICD-10-CM | POA: Diagnosis not present

## 2016-07-23 DIAGNOSIS — I10 Essential (primary) hypertension: Secondary | ICD-10-CM | POA: Diagnosis not present

## 2016-07-23 DIAGNOSIS — R251 Tremor, unspecified: Secondary | ICD-10-CM | POA: Diagnosis not present

## 2016-07-23 DIAGNOSIS — D649 Anemia, unspecified: Secondary | ICD-10-CM | POA: Diagnosis not present

## 2016-07-23 DIAGNOSIS — Z9181 History of falling: Secondary | ICD-10-CM | POA: Diagnosis not present

## 2016-07-23 DIAGNOSIS — J449 Chronic obstructive pulmonary disease, unspecified: Secondary | ICD-10-CM | POA: Diagnosis not present

## 2016-07-23 DIAGNOSIS — S91302D Unspecified open wound, left foot, subsequent encounter: Secondary | ICD-10-CM | POA: Diagnosis not present

## 2016-07-23 DIAGNOSIS — Z87891 Personal history of nicotine dependence: Secondary | ICD-10-CM | POA: Diagnosis not present

## 2016-07-23 DIAGNOSIS — J9612 Chronic respiratory failure with hypercapnia: Secondary | ICD-10-CM | POA: Diagnosis not present

## 2016-07-25 DIAGNOSIS — J449 Chronic obstructive pulmonary disease, unspecified: Secondary | ICD-10-CM | POA: Diagnosis not present

## 2016-07-26 DIAGNOSIS — G309 Alzheimer's disease, unspecified: Secondary | ICD-10-CM | POA: Diagnosis not present

## 2016-07-26 DIAGNOSIS — M1991 Primary osteoarthritis, unspecified site: Secondary | ICD-10-CM | POA: Diagnosis not present

## 2016-07-26 DIAGNOSIS — S91302D Unspecified open wound, left foot, subsequent encounter: Secondary | ICD-10-CM | POA: Diagnosis not present

## 2016-07-26 DIAGNOSIS — D649 Anemia, unspecified: Secondary | ICD-10-CM | POA: Diagnosis not present

## 2016-07-26 DIAGNOSIS — J9612 Chronic respiratory failure with hypercapnia: Secondary | ICD-10-CM | POA: Diagnosis not present

## 2016-07-26 DIAGNOSIS — Z7982 Long term (current) use of aspirin: Secondary | ICD-10-CM | POA: Diagnosis not present

## 2016-07-26 DIAGNOSIS — R251 Tremor, unspecified: Secondary | ICD-10-CM | POA: Diagnosis not present

## 2016-07-26 DIAGNOSIS — J449 Chronic obstructive pulmonary disease, unspecified: Secondary | ICD-10-CM | POA: Diagnosis not present

## 2016-07-26 DIAGNOSIS — Z87891 Personal history of nicotine dependence: Secondary | ICD-10-CM | POA: Diagnosis not present

## 2016-07-26 DIAGNOSIS — I10 Essential (primary) hypertension: Secondary | ICD-10-CM | POA: Diagnosis not present

## 2016-07-26 DIAGNOSIS — Z9181 History of falling: Secondary | ICD-10-CM | POA: Diagnosis not present

## 2016-07-27 DIAGNOSIS — J9612 Chronic respiratory failure with hypercapnia: Secondary | ICD-10-CM | POA: Diagnosis not present

## 2016-07-27 DIAGNOSIS — I10 Essential (primary) hypertension: Secondary | ICD-10-CM | POA: Diagnosis not present

## 2016-07-27 DIAGNOSIS — Z87891 Personal history of nicotine dependence: Secondary | ICD-10-CM | POA: Diagnosis not present

## 2016-07-27 DIAGNOSIS — Z7982 Long term (current) use of aspirin: Secondary | ICD-10-CM | POA: Diagnosis not present

## 2016-07-27 DIAGNOSIS — J449 Chronic obstructive pulmonary disease, unspecified: Secondary | ICD-10-CM | POA: Diagnosis not present

## 2016-07-27 DIAGNOSIS — S91302D Unspecified open wound, left foot, subsequent encounter: Secondary | ICD-10-CM | POA: Diagnosis not present

## 2016-07-27 DIAGNOSIS — D649 Anemia, unspecified: Secondary | ICD-10-CM | POA: Diagnosis not present

## 2016-07-27 DIAGNOSIS — G309 Alzheimer's disease, unspecified: Secondary | ICD-10-CM | POA: Diagnosis not present

## 2016-07-27 DIAGNOSIS — Z9181 History of falling: Secondary | ICD-10-CM | POA: Diagnosis not present

## 2016-07-27 DIAGNOSIS — M1991 Primary osteoarthritis, unspecified site: Secondary | ICD-10-CM | POA: Diagnosis not present

## 2016-07-27 DIAGNOSIS — R251 Tremor, unspecified: Secondary | ICD-10-CM | POA: Diagnosis not present

## 2016-07-28 DIAGNOSIS — I10 Essential (primary) hypertension: Secondary | ICD-10-CM | POA: Diagnosis not present

## 2016-07-28 DIAGNOSIS — Z87891 Personal history of nicotine dependence: Secondary | ICD-10-CM | POA: Diagnosis not present

## 2016-07-28 DIAGNOSIS — R251 Tremor, unspecified: Secondary | ICD-10-CM | POA: Diagnosis not present

## 2016-07-28 DIAGNOSIS — M1991 Primary osteoarthritis, unspecified site: Secondary | ICD-10-CM | POA: Diagnosis not present

## 2016-07-28 DIAGNOSIS — Z9181 History of falling: Secondary | ICD-10-CM | POA: Diagnosis not present

## 2016-07-28 DIAGNOSIS — J9612 Chronic respiratory failure with hypercapnia: Secondary | ICD-10-CM | POA: Diagnosis not present

## 2016-07-28 DIAGNOSIS — S91302D Unspecified open wound, left foot, subsequent encounter: Secondary | ICD-10-CM | POA: Diagnosis not present

## 2016-07-28 DIAGNOSIS — Z7982 Long term (current) use of aspirin: Secondary | ICD-10-CM | POA: Diagnosis not present

## 2016-07-28 DIAGNOSIS — D649 Anemia, unspecified: Secondary | ICD-10-CM | POA: Diagnosis not present

## 2016-07-28 DIAGNOSIS — G309 Alzheimer's disease, unspecified: Secondary | ICD-10-CM | POA: Diagnosis not present

## 2016-07-28 DIAGNOSIS — J449 Chronic obstructive pulmonary disease, unspecified: Secondary | ICD-10-CM | POA: Diagnosis not present

## 2016-07-29 DIAGNOSIS — L89311 Pressure ulcer of right buttock, stage 1: Secondary | ICD-10-CM | POA: Diagnosis not present

## 2016-07-29 DIAGNOSIS — K210 Gastro-esophageal reflux disease with esophagitis: Secondary | ICD-10-CM | POA: Diagnosis not present

## 2016-07-29 DIAGNOSIS — E876 Hypokalemia: Secondary | ICD-10-CM | POA: Diagnosis not present

## 2016-07-29 DIAGNOSIS — J449 Chronic obstructive pulmonary disease, unspecified: Secondary | ICD-10-CM | POA: Diagnosis not present

## 2016-07-29 DIAGNOSIS — I10 Essential (primary) hypertension: Secondary | ICD-10-CM | POA: Diagnosis not present

## 2016-07-30 DIAGNOSIS — I739 Peripheral vascular disease, unspecified: Secondary | ICD-10-CM | POA: Diagnosis not present

## 2016-07-30 DIAGNOSIS — R251 Tremor, unspecified: Secondary | ICD-10-CM | POA: Diagnosis not present

## 2016-07-30 DIAGNOSIS — R600 Localized edema: Secondary | ICD-10-CM | POA: Diagnosis not present

## 2016-07-30 DIAGNOSIS — S91302D Unspecified open wound, left foot, subsequent encounter: Secondary | ICD-10-CM | POA: Diagnosis not present

## 2016-07-30 DIAGNOSIS — M2040 Other hammer toe(s) (acquired), unspecified foot: Secondary | ICD-10-CM | POA: Diagnosis not present

## 2016-07-30 DIAGNOSIS — Z87891 Personal history of nicotine dependence: Secondary | ICD-10-CM | POA: Diagnosis not present

## 2016-07-30 DIAGNOSIS — J9612 Chronic respiratory failure with hypercapnia: Secondary | ICD-10-CM | POA: Diagnosis not present

## 2016-07-30 DIAGNOSIS — J449 Chronic obstructive pulmonary disease, unspecified: Secondary | ICD-10-CM | POA: Diagnosis not present

## 2016-07-30 DIAGNOSIS — Z9181 History of falling: Secondary | ICD-10-CM | POA: Diagnosis not present

## 2016-07-30 DIAGNOSIS — I10 Essential (primary) hypertension: Secondary | ICD-10-CM | POA: Diagnosis not present

## 2016-07-30 DIAGNOSIS — G309 Alzheimer's disease, unspecified: Secondary | ICD-10-CM | POA: Diagnosis not present

## 2016-07-30 DIAGNOSIS — D649 Anemia, unspecified: Secondary | ICD-10-CM | POA: Diagnosis not present

## 2016-07-30 DIAGNOSIS — M1991 Primary osteoarthritis, unspecified site: Secondary | ICD-10-CM | POA: Diagnosis not present

## 2016-07-30 DIAGNOSIS — L84 Corns and callosities: Secondary | ICD-10-CM | POA: Diagnosis not present

## 2016-07-30 DIAGNOSIS — L853 Xerosis cutis: Secondary | ICD-10-CM | POA: Diagnosis not present

## 2016-07-30 DIAGNOSIS — Z7982 Long term (current) use of aspirin: Secondary | ICD-10-CM | POA: Diagnosis not present

## 2016-08-01 DIAGNOSIS — J449 Chronic obstructive pulmonary disease, unspecified: Secondary | ICD-10-CM | POA: Diagnosis not present

## 2016-08-03 DIAGNOSIS — J9612 Chronic respiratory failure with hypercapnia: Secondary | ICD-10-CM | POA: Diagnosis not present

## 2016-08-03 DIAGNOSIS — D649 Anemia, unspecified: Secondary | ICD-10-CM | POA: Diagnosis not present

## 2016-08-03 DIAGNOSIS — G309 Alzheimer's disease, unspecified: Secondary | ICD-10-CM | POA: Diagnosis not present

## 2016-08-03 DIAGNOSIS — M1991 Primary osteoarthritis, unspecified site: Secondary | ICD-10-CM | POA: Diagnosis not present

## 2016-08-03 DIAGNOSIS — S91302D Unspecified open wound, left foot, subsequent encounter: Secondary | ICD-10-CM | POA: Diagnosis not present

## 2016-08-03 DIAGNOSIS — J449 Chronic obstructive pulmonary disease, unspecified: Secondary | ICD-10-CM | POA: Diagnosis not present

## 2016-08-03 DIAGNOSIS — Z9181 History of falling: Secondary | ICD-10-CM | POA: Diagnosis not present

## 2016-08-03 DIAGNOSIS — I10 Essential (primary) hypertension: Secondary | ICD-10-CM | POA: Diagnosis not present

## 2016-08-03 DIAGNOSIS — Z7982 Long term (current) use of aspirin: Secondary | ICD-10-CM | POA: Diagnosis not present

## 2016-08-03 DIAGNOSIS — Z87891 Personal history of nicotine dependence: Secondary | ICD-10-CM | POA: Diagnosis not present

## 2016-08-03 DIAGNOSIS — R251 Tremor, unspecified: Secondary | ICD-10-CM | POA: Diagnosis not present

## 2016-08-04 DIAGNOSIS — Z7951 Long term (current) use of inhaled steroids: Secondary | ICD-10-CM | POA: Diagnosis not present

## 2016-08-04 DIAGNOSIS — M1712 Unilateral primary osteoarthritis, left knee: Secondary | ICD-10-CM | POA: Diagnosis not present

## 2016-08-04 DIAGNOSIS — S80212A Abrasion, left knee, initial encounter: Secondary | ICD-10-CM | POA: Diagnosis not present

## 2016-08-04 DIAGNOSIS — J189 Pneumonia, unspecified organism: Secondary | ICD-10-CM | POA: Diagnosis not present

## 2016-08-04 DIAGNOSIS — R229 Localized swelling, mass and lump, unspecified: Secondary | ICD-10-CM | POA: Diagnosis not present

## 2016-08-04 DIAGNOSIS — S0990XA Unspecified injury of head, initial encounter: Secondary | ICD-10-CM | POA: Diagnosis not present

## 2016-08-04 DIAGNOSIS — Z87891 Personal history of nicotine dependence: Secondary | ICD-10-CM | POA: Diagnosis not present

## 2016-08-04 DIAGNOSIS — R262 Difficulty in walking, not elsewhere classified: Secondary | ICD-10-CM | POA: Diagnosis not present

## 2016-08-04 DIAGNOSIS — Z880 Allergy status to penicillin: Secondary | ICD-10-CM | POA: Diagnosis not present

## 2016-08-04 DIAGNOSIS — W19XXXA Unspecified fall, initial encounter: Secondary | ICD-10-CM | POA: Diagnosis not present

## 2016-08-04 DIAGNOSIS — J440 Chronic obstructive pulmonary disease with acute lower respiratory infection: Secondary | ICD-10-CM | POA: Diagnosis not present

## 2016-08-04 DIAGNOSIS — R2689 Other abnormalities of gait and mobility: Secondary | ICD-10-CM | POA: Diagnosis not present

## 2016-08-04 DIAGNOSIS — Z888 Allergy status to other drugs, medicaments and biological substances status: Secondary | ICD-10-CM | POA: Diagnosis not present

## 2016-08-04 DIAGNOSIS — S0003XA Contusion of scalp, initial encounter: Secondary | ICD-10-CM | POA: Diagnosis not present

## 2016-08-04 DIAGNOSIS — K219 Gastro-esophageal reflux disease without esophagitis: Secondary | ICD-10-CM | POA: Diagnosis not present

## 2016-08-04 DIAGNOSIS — Z881 Allergy status to other antibiotic agents status: Secondary | ICD-10-CM | POA: Diagnosis not present

## 2016-08-04 DIAGNOSIS — Z9181 History of falling: Secondary | ICD-10-CM | POA: Diagnosis not present

## 2016-08-04 DIAGNOSIS — Z91012 Allergy to eggs: Secondary | ICD-10-CM | POA: Diagnosis not present

## 2016-08-04 DIAGNOSIS — Z885 Allergy status to narcotic agent status: Secondary | ICD-10-CM | POA: Diagnosis not present

## 2016-08-04 DIAGNOSIS — R51 Headache: Secondary | ICD-10-CM | POA: Diagnosis not present

## 2016-08-04 DIAGNOSIS — Z79899 Other long term (current) drug therapy: Secondary | ICD-10-CM | POA: Diagnosis not present

## 2016-08-04 DIAGNOSIS — S0083XA Contusion of other part of head, initial encounter: Secondary | ICD-10-CM | POA: Diagnosis not present

## 2016-08-04 DIAGNOSIS — Z7982 Long term (current) use of aspirin: Secondary | ICD-10-CM | POA: Diagnosis not present

## 2016-08-04 DIAGNOSIS — I10 Essential (primary) hypertension: Secondary | ICD-10-CM | POA: Diagnosis not present

## 2016-08-05 DIAGNOSIS — M1990 Unspecified osteoarthritis, unspecified site: Secondary | ICD-10-CM | POA: Diagnosis not present

## 2016-08-05 DIAGNOSIS — Z7982 Long term (current) use of aspirin: Secondary | ICD-10-CM | POA: Diagnosis not present

## 2016-08-05 DIAGNOSIS — M1991 Primary osteoarthritis, unspecified site: Secondary | ICD-10-CM | POA: Diagnosis not present

## 2016-08-05 DIAGNOSIS — Z9181 History of falling: Secondary | ICD-10-CM | POA: Diagnosis not present

## 2016-08-05 DIAGNOSIS — J449 Chronic obstructive pulmonary disease, unspecified: Secondary | ICD-10-CM | POA: Diagnosis not present

## 2016-08-05 DIAGNOSIS — R251 Tremor, unspecified: Secondary | ICD-10-CM | POA: Diagnosis not present

## 2016-08-05 DIAGNOSIS — D649 Anemia, unspecified: Secondary | ICD-10-CM | POA: Diagnosis not present

## 2016-08-05 DIAGNOSIS — I10 Essential (primary) hypertension: Secondary | ICD-10-CM | POA: Diagnosis not present

## 2016-08-05 DIAGNOSIS — G894 Chronic pain syndrome: Secondary | ICD-10-CM | POA: Diagnosis not present

## 2016-08-05 DIAGNOSIS — J9612 Chronic respiratory failure with hypercapnia: Secondary | ICD-10-CM | POA: Diagnosis not present

## 2016-08-05 DIAGNOSIS — G309 Alzheimer's disease, unspecified: Secondary | ICD-10-CM | POA: Diagnosis not present

## 2016-08-05 DIAGNOSIS — Z87891 Personal history of nicotine dependence: Secondary | ICD-10-CM | POA: Diagnosis not present

## 2016-08-05 DIAGNOSIS — S91302D Unspecified open wound, left foot, subsequent encounter: Secondary | ICD-10-CM | POA: Diagnosis not present

## 2016-08-06 DIAGNOSIS — D649 Anemia, unspecified: Secondary | ICD-10-CM | POA: Diagnosis not present

## 2016-08-06 DIAGNOSIS — J9612 Chronic respiratory failure with hypercapnia: Secondary | ICD-10-CM | POA: Diagnosis not present

## 2016-08-06 DIAGNOSIS — Z9181 History of falling: Secondary | ICD-10-CM | POA: Diagnosis not present

## 2016-08-06 DIAGNOSIS — Z87891 Personal history of nicotine dependence: Secondary | ICD-10-CM | POA: Diagnosis not present

## 2016-08-06 DIAGNOSIS — S91302D Unspecified open wound, left foot, subsequent encounter: Secondary | ICD-10-CM | POA: Diagnosis not present

## 2016-08-06 DIAGNOSIS — R251 Tremor, unspecified: Secondary | ICD-10-CM | POA: Diagnosis not present

## 2016-08-06 DIAGNOSIS — I10 Essential (primary) hypertension: Secondary | ICD-10-CM | POA: Diagnosis not present

## 2016-08-06 DIAGNOSIS — Z7982 Long term (current) use of aspirin: Secondary | ICD-10-CM | POA: Diagnosis not present

## 2016-08-06 DIAGNOSIS — G309 Alzheimer's disease, unspecified: Secondary | ICD-10-CM | POA: Diagnosis not present

## 2016-08-06 DIAGNOSIS — J449 Chronic obstructive pulmonary disease, unspecified: Secondary | ICD-10-CM | POA: Diagnosis not present

## 2016-08-06 DIAGNOSIS — M1991 Primary osteoarthritis, unspecified site: Secondary | ICD-10-CM | POA: Diagnosis not present

## 2016-08-10 DIAGNOSIS — I10 Essential (primary) hypertension: Secondary | ICD-10-CM | POA: Diagnosis not present

## 2016-08-10 DIAGNOSIS — S91302D Unspecified open wound, left foot, subsequent encounter: Secondary | ICD-10-CM | POA: Diagnosis not present

## 2016-08-10 DIAGNOSIS — J449 Chronic obstructive pulmonary disease, unspecified: Secondary | ICD-10-CM | POA: Diagnosis not present

## 2016-08-10 DIAGNOSIS — D649 Anemia, unspecified: Secondary | ICD-10-CM | POA: Diagnosis not present

## 2016-08-10 DIAGNOSIS — M1991 Primary osteoarthritis, unspecified site: Secondary | ICD-10-CM | POA: Diagnosis not present

## 2016-08-10 DIAGNOSIS — Z87891 Personal history of nicotine dependence: Secondary | ICD-10-CM | POA: Diagnosis not present

## 2016-08-10 DIAGNOSIS — R296 Repeated falls: Secondary | ICD-10-CM | POA: Diagnosis not present

## 2016-08-10 DIAGNOSIS — R251 Tremor, unspecified: Secondary | ICD-10-CM | POA: Diagnosis not present

## 2016-08-10 DIAGNOSIS — J9612 Chronic respiratory failure with hypercapnia: Secondary | ICD-10-CM | POA: Diagnosis not present

## 2016-08-10 DIAGNOSIS — G309 Alzheimer's disease, unspecified: Secondary | ICD-10-CM | POA: Diagnosis not present

## 2016-08-11 DIAGNOSIS — J449 Chronic obstructive pulmonary disease, unspecified: Secondary | ICD-10-CM | POA: Diagnosis not present

## 2016-08-11 DIAGNOSIS — Z87891 Personal history of nicotine dependence: Secondary | ICD-10-CM | POA: Diagnosis not present

## 2016-08-11 DIAGNOSIS — G309 Alzheimer's disease, unspecified: Secondary | ICD-10-CM | POA: Diagnosis not present

## 2016-08-11 DIAGNOSIS — S91302D Unspecified open wound, left foot, subsequent encounter: Secondary | ICD-10-CM | POA: Diagnosis not present

## 2016-08-11 DIAGNOSIS — I10 Essential (primary) hypertension: Secondary | ICD-10-CM | POA: Diagnosis not present

## 2016-08-11 DIAGNOSIS — M1991 Primary osteoarthritis, unspecified site: Secondary | ICD-10-CM | POA: Diagnosis not present

## 2016-08-11 DIAGNOSIS — R296 Repeated falls: Secondary | ICD-10-CM | POA: Diagnosis not present

## 2016-08-11 DIAGNOSIS — D649 Anemia, unspecified: Secondary | ICD-10-CM | POA: Diagnosis not present

## 2016-08-11 DIAGNOSIS — R251 Tremor, unspecified: Secondary | ICD-10-CM | POA: Diagnosis not present

## 2016-08-11 DIAGNOSIS — J9612 Chronic respiratory failure with hypercapnia: Secondary | ICD-10-CM | POA: Diagnosis not present

## 2016-08-12 DIAGNOSIS — R296 Repeated falls: Secondary | ICD-10-CM | POA: Diagnosis not present

## 2016-08-12 DIAGNOSIS — J189 Pneumonia, unspecified organism: Secondary | ICD-10-CM | POA: Diagnosis not present

## 2016-08-13 DIAGNOSIS — J449 Chronic obstructive pulmonary disease, unspecified: Secondary | ICD-10-CM | POA: Diagnosis not present

## 2016-08-13 DIAGNOSIS — D649 Anemia, unspecified: Secondary | ICD-10-CM | POA: Diagnosis not present

## 2016-08-13 DIAGNOSIS — I10 Essential (primary) hypertension: Secondary | ICD-10-CM | POA: Diagnosis not present

## 2016-08-13 DIAGNOSIS — Z87891 Personal history of nicotine dependence: Secondary | ICD-10-CM | POA: Diagnosis not present

## 2016-08-13 DIAGNOSIS — R251 Tremor, unspecified: Secondary | ICD-10-CM | POA: Diagnosis not present

## 2016-08-13 DIAGNOSIS — M1991 Primary osteoarthritis, unspecified site: Secondary | ICD-10-CM | POA: Diagnosis not present

## 2016-08-13 DIAGNOSIS — J9612 Chronic respiratory failure with hypercapnia: Secondary | ICD-10-CM | POA: Diagnosis not present

## 2016-08-13 DIAGNOSIS — G309 Alzheimer's disease, unspecified: Secondary | ICD-10-CM | POA: Diagnosis not present

## 2016-08-13 DIAGNOSIS — S91302D Unspecified open wound, left foot, subsequent encounter: Secondary | ICD-10-CM | POA: Diagnosis not present

## 2016-08-13 DIAGNOSIS — R296 Repeated falls: Secondary | ICD-10-CM | POA: Diagnosis not present

## 2016-08-16 DIAGNOSIS — J9612 Chronic respiratory failure with hypercapnia: Secondary | ICD-10-CM | POA: Diagnosis not present

## 2016-08-16 DIAGNOSIS — R251 Tremor, unspecified: Secondary | ICD-10-CM | POA: Diagnosis not present

## 2016-08-16 DIAGNOSIS — R296 Repeated falls: Secondary | ICD-10-CM | POA: Diagnosis not present

## 2016-08-16 DIAGNOSIS — S91302D Unspecified open wound, left foot, subsequent encounter: Secondary | ICD-10-CM | POA: Diagnosis not present

## 2016-08-16 DIAGNOSIS — M1991 Primary osteoarthritis, unspecified site: Secondary | ICD-10-CM | POA: Diagnosis not present

## 2016-08-16 DIAGNOSIS — D649 Anemia, unspecified: Secondary | ICD-10-CM | POA: Diagnosis not present

## 2016-08-16 DIAGNOSIS — I10 Essential (primary) hypertension: Secondary | ICD-10-CM | POA: Diagnosis not present

## 2016-08-16 DIAGNOSIS — J449 Chronic obstructive pulmonary disease, unspecified: Secondary | ICD-10-CM | POA: Diagnosis not present

## 2016-08-16 DIAGNOSIS — G309 Alzheimer's disease, unspecified: Secondary | ICD-10-CM | POA: Diagnosis not present

## 2016-08-16 DIAGNOSIS — Z87891 Personal history of nicotine dependence: Secondary | ICD-10-CM | POA: Diagnosis not present

## 2016-08-18 DIAGNOSIS — F5101 Primary insomnia: Secondary | ICD-10-CM | POA: Diagnosis not present

## 2016-08-18 DIAGNOSIS — R296 Repeated falls: Secondary | ICD-10-CM | POA: Diagnosis not present

## 2016-08-18 DIAGNOSIS — J9612 Chronic respiratory failure with hypercapnia: Secondary | ICD-10-CM | POA: Diagnosis not present

## 2016-08-18 DIAGNOSIS — M1991 Primary osteoarthritis, unspecified site: Secondary | ICD-10-CM | POA: Diagnosis not present

## 2016-08-18 DIAGNOSIS — R251 Tremor, unspecified: Secondary | ICD-10-CM | POA: Diagnosis not present

## 2016-08-18 DIAGNOSIS — D649 Anemia, unspecified: Secondary | ICD-10-CM | POA: Diagnosis not present

## 2016-08-18 DIAGNOSIS — Z87891 Personal history of nicotine dependence: Secondary | ICD-10-CM | POA: Diagnosis not present

## 2016-08-18 DIAGNOSIS — I10 Essential (primary) hypertension: Secondary | ICD-10-CM | POA: Diagnosis not present

## 2016-08-18 DIAGNOSIS — S91302D Unspecified open wound, left foot, subsequent encounter: Secondary | ICD-10-CM | POA: Diagnosis not present

## 2016-08-18 DIAGNOSIS — J449 Chronic obstructive pulmonary disease, unspecified: Secondary | ICD-10-CM | POA: Diagnosis not present

## 2016-08-18 DIAGNOSIS — G309 Alzheimer's disease, unspecified: Secondary | ICD-10-CM | POA: Diagnosis not present

## 2016-08-19 DIAGNOSIS — J449 Chronic obstructive pulmonary disease, unspecified: Secondary | ICD-10-CM | POA: Diagnosis not present

## 2016-08-19 DIAGNOSIS — R296 Repeated falls: Secondary | ICD-10-CM | POA: Diagnosis not present

## 2016-08-19 DIAGNOSIS — Z87891 Personal history of nicotine dependence: Secondary | ICD-10-CM | POA: Diagnosis not present

## 2016-08-19 DIAGNOSIS — J9612 Chronic respiratory failure with hypercapnia: Secondary | ICD-10-CM | POA: Diagnosis not present

## 2016-08-19 DIAGNOSIS — D649 Anemia, unspecified: Secondary | ICD-10-CM | POA: Diagnosis not present

## 2016-08-19 DIAGNOSIS — M1991 Primary osteoarthritis, unspecified site: Secondary | ICD-10-CM | POA: Diagnosis not present

## 2016-08-19 DIAGNOSIS — R251 Tremor, unspecified: Secondary | ICD-10-CM | POA: Diagnosis not present

## 2016-08-19 DIAGNOSIS — G309 Alzheimer's disease, unspecified: Secondary | ICD-10-CM | POA: Diagnosis not present

## 2016-08-19 DIAGNOSIS — S91302D Unspecified open wound, left foot, subsequent encounter: Secondary | ICD-10-CM | POA: Diagnosis not present

## 2016-08-19 DIAGNOSIS — I10 Essential (primary) hypertension: Secondary | ICD-10-CM | POA: Diagnosis not present

## 2016-08-24 DIAGNOSIS — R296 Repeated falls: Secondary | ICD-10-CM | POA: Diagnosis not present

## 2016-08-24 DIAGNOSIS — J9612 Chronic respiratory failure with hypercapnia: Secondary | ICD-10-CM | POA: Diagnosis not present

## 2016-08-24 DIAGNOSIS — S91302D Unspecified open wound, left foot, subsequent encounter: Secondary | ICD-10-CM | POA: Diagnosis not present

## 2016-08-24 DIAGNOSIS — D649 Anemia, unspecified: Secondary | ICD-10-CM | POA: Diagnosis not present

## 2016-08-24 DIAGNOSIS — Z87891 Personal history of nicotine dependence: Secondary | ICD-10-CM | POA: Diagnosis not present

## 2016-08-24 DIAGNOSIS — R251 Tremor, unspecified: Secondary | ICD-10-CM | POA: Diagnosis not present

## 2016-08-24 DIAGNOSIS — I10 Essential (primary) hypertension: Secondary | ICD-10-CM | POA: Diagnosis not present

## 2016-08-24 DIAGNOSIS — G309 Alzheimer's disease, unspecified: Secondary | ICD-10-CM | POA: Diagnosis not present

## 2016-08-24 DIAGNOSIS — J449 Chronic obstructive pulmonary disease, unspecified: Secondary | ICD-10-CM | POA: Diagnosis not present

## 2016-08-24 DIAGNOSIS — M1991 Primary osteoarthritis, unspecified site: Secondary | ICD-10-CM | POA: Diagnosis not present

## 2016-08-25 DIAGNOSIS — J449 Chronic obstructive pulmonary disease, unspecified: Secondary | ICD-10-CM | POA: Diagnosis not present

## 2016-08-26 DIAGNOSIS — R251 Tremor, unspecified: Secondary | ICD-10-CM | POA: Diagnosis not present

## 2016-08-26 DIAGNOSIS — M1990 Unspecified osteoarthritis, unspecified site: Secondary | ICD-10-CM | POA: Diagnosis not present

## 2016-08-26 DIAGNOSIS — K210 Gastro-esophageal reflux disease with esophagitis: Secondary | ICD-10-CM | POA: Diagnosis not present

## 2016-08-26 DIAGNOSIS — E876 Hypokalemia: Secondary | ICD-10-CM | POA: Diagnosis not present

## 2016-08-26 DIAGNOSIS — D649 Anemia, unspecified: Secondary | ICD-10-CM | POA: Diagnosis not present

## 2016-08-26 DIAGNOSIS — M1991 Primary osteoarthritis, unspecified site: Secondary | ICD-10-CM | POA: Diagnosis not present

## 2016-08-26 DIAGNOSIS — S91302D Unspecified open wound, left foot, subsequent encounter: Secondary | ICD-10-CM | POA: Diagnosis not present

## 2016-08-26 DIAGNOSIS — J9612 Chronic respiratory failure with hypercapnia: Secondary | ICD-10-CM | POA: Diagnosis not present

## 2016-08-26 DIAGNOSIS — R296 Repeated falls: Secondary | ICD-10-CM | POA: Diagnosis not present

## 2016-08-26 DIAGNOSIS — Z87891 Personal history of nicotine dependence: Secondary | ICD-10-CM | POA: Diagnosis not present

## 2016-08-26 DIAGNOSIS — G309 Alzheimer's disease, unspecified: Secondary | ICD-10-CM | POA: Diagnosis not present

## 2016-08-26 DIAGNOSIS — J449 Chronic obstructive pulmonary disease, unspecified: Secondary | ICD-10-CM | POA: Diagnosis not present

## 2016-08-26 DIAGNOSIS — I10 Essential (primary) hypertension: Secondary | ICD-10-CM | POA: Diagnosis not present

## 2016-08-27 DIAGNOSIS — G309 Alzheimer's disease, unspecified: Secondary | ICD-10-CM | POA: Diagnosis not present

## 2016-08-27 DIAGNOSIS — D649 Anemia, unspecified: Secondary | ICD-10-CM | POA: Diagnosis not present

## 2016-08-27 DIAGNOSIS — R251 Tremor, unspecified: Secondary | ICD-10-CM | POA: Diagnosis not present

## 2016-08-27 DIAGNOSIS — M1991 Primary osteoarthritis, unspecified site: Secondary | ICD-10-CM | POA: Diagnosis not present

## 2016-08-27 DIAGNOSIS — I10 Essential (primary) hypertension: Secondary | ICD-10-CM | POA: Diagnosis not present

## 2016-08-27 DIAGNOSIS — J9612 Chronic respiratory failure with hypercapnia: Secondary | ICD-10-CM | POA: Diagnosis not present

## 2016-08-27 DIAGNOSIS — R296 Repeated falls: Secondary | ICD-10-CM | POA: Diagnosis not present

## 2016-08-27 DIAGNOSIS — Z87891 Personal history of nicotine dependence: Secondary | ICD-10-CM | POA: Diagnosis not present

## 2016-08-27 DIAGNOSIS — J449 Chronic obstructive pulmonary disease, unspecified: Secondary | ICD-10-CM | POA: Diagnosis not present

## 2016-08-27 DIAGNOSIS — S91302D Unspecified open wound, left foot, subsequent encounter: Secondary | ICD-10-CM | POA: Diagnosis not present

## 2016-08-30 DIAGNOSIS — M1991 Primary osteoarthritis, unspecified site: Secondary | ICD-10-CM | POA: Diagnosis not present

## 2016-08-30 DIAGNOSIS — Z87891 Personal history of nicotine dependence: Secondary | ICD-10-CM | POA: Diagnosis not present

## 2016-08-30 DIAGNOSIS — R251 Tremor, unspecified: Secondary | ICD-10-CM | POA: Diagnosis not present

## 2016-08-30 DIAGNOSIS — J9612 Chronic respiratory failure with hypercapnia: Secondary | ICD-10-CM | POA: Diagnosis not present

## 2016-08-30 DIAGNOSIS — I10 Essential (primary) hypertension: Secondary | ICD-10-CM | POA: Diagnosis not present

## 2016-08-30 DIAGNOSIS — D649 Anemia, unspecified: Secondary | ICD-10-CM | POA: Diagnosis not present

## 2016-08-30 DIAGNOSIS — G309 Alzheimer's disease, unspecified: Secondary | ICD-10-CM | POA: Diagnosis not present

## 2016-08-30 DIAGNOSIS — S91302D Unspecified open wound, left foot, subsequent encounter: Secondary | ICD-10-CM | POA: Diagnosis not present

## 2016-08-30 DIAGNOSIS — R296 Repeated falls: Secondary | ICD-10-CM | POA: Diagnosis not present

## 2016-08-30 DIAGNOSIS — J449 Chronic obstructive pulmonary disease, unspecified: Secondary | ICD-10-CM | POA: Diagnosis not present

## 2016-08-31 DIAGNOSIS — R296 Repeated falls: Secondary | ICD-10-CM | POA: Diagnosis not present

## 2016-08-31 DIAGNOSIS — Z87891 Personal history of nicotine dependence: Secondary | ICD-10-CM | POA: Diagnosis not present

## 2016-08-31 DIAGNOSIS — S91302D Unspecified open wound, left foot, subsequent encounter: Secondary | ICD-10-CM | POA: Diagnosis not present

## 2016-08-31 DIAGNOSIS — J449 Chronic obstructive pulmonary disease, unspecified: Secondary | ICD-10-CM | POA: Diagnosis not present

## 2016-08-31 DIAGNOSIS — M1991 Primary osteoarthritis, unspecified site: Secondary | ICD-10-CM | POA: Diagnosis not present

## 2016-08-31 DIAGNOSIS — I10 Essential (primary) hypertension: Secondary | ICD-10-CM | POA: Diagnosis not present

## 2016-08-31 DIAGNOSIS — R251 Tremor, unspecified: Secondary | ICD-10-CM | POA: Diagnosis not present

## 2016-08-31 DIAGNOSIS — J9612 Chronic respiratory failure with hypercapnia: Secondary | ICD-10-CM | POA: Diagnosis not present

## 2016-08-31 DIAGNOSIS — E876 Hypokalemia: Secondary | ICD-10-CM | POA: Diagnosis not present

## 2016-08-31 DIAGNOSIS — D649 Anemia, unspecified: Secondary | ICD-10-CM | POA: Diagnosis not present

## 2016-08-31 DIAGNOSIS — G309 Alzheimer's disease, unspecified: Secondary | ICD-10-CM | POA: Diagnosis not present

## 2016-09-01 DIAGNOSIS — J449 Chronic obstructive pulmonary disease, unspecified: Secondary | ICD-10-CM | POA: Diagnosis not present

## 2016-09-02 DIAGNOSIS — S91302D Unspecified open wound, left foot, subsequent encounter: Secondary | ICD-10-CM | POA: Diagnosis not present

## 2016-09-02 DIAGNOSIS — I10 Essential (primary) hypertension: Secondary | ICD-10-CM | POA: Diagnosis not present

## 2016-09-02 DIAGNOSIS — G309 Alzheimer's disease, unspecified: Secondary | ICD-10-CM | POA: Diagnosis not present

## 2016-09-02 DIAGNOSIS — D649 Anemia, unspecified: Secondary | ICD-10-CM | POA: Diagnosis not present

## 2016-09-02 DIAGNOSIS — R296 Repeated falls: Secondary | ICD-10-CM | POA: Diagnosis not present

## 2016-09-02 DIAGNOSIS — R251 Tremor, unspecified: Secondary | ICD-10-CM | POA: Diagnosis not present

## 2016-09-02 DIAGNOSIS — J449 Chronic obstructive pulmonary disease, unspecified: Secondary | ICD-10-CM | POA: Diagnosis not present

## 2016-09-02 DIAGNOSIS — R300 Dysuria: Secondary | ICD-10-CM | POA: Diagnosis not present

## 2016-09-02 DIAGNOSIS — M1991 Primary osteoarthritis, unspecified site: Secondary | ICD-10-CM | POA: Diagnosis not present

## 2016-09-02 DIAGNOSIS — Z87891 Personal history of nicotine dependence: Secondary | ICD-10-CM | POA: Diagnosis not present

## 2016-09-02 DIAGNOSIS — J9612 Chronic respiratory failure with hypercapnia: Secondary | ICD-10-CM | POA: Diagnosis not present

## 2016-09-02 DIAGNOSIS — M1990 Unspecified osteoarthritis, unspecified site: Secondary | ICD-10-CM | POA: Diagnosis not present

## 2016-09-02 DIAGNOSIS — G894 Chronic pain syndrome: Secondary | ICD-10-CM | POA: Diagnosis not present

## 2016-09-03 DIAGNOSIS — J449 Chronic obstructive pulmonary disease, unspecified: Secondary | ICD-10-CM | POA: Diagnosis not present

## 2016-09-03 DIAGNOSIS — G309 Alzheimer's disease, unspecified: Secondary | ICD-10-CM | POA: Diagnosis not present

## 2016-09-03 DIAGNOSIS — I10 Essential (primary) hypertension: Secondary | ICD-10-CM | POA: Diagnosis not present

## 2016-09-03 DIAGNOSIS — Z87891 Personal history of nicotine dependence: Secondary | ICD-10-CM | POA: Diagnosis not present

## 2016-09-03 DIAGNOSIS — D649 Anemia, unspecified: Secondary | ICD-10-CM | POA: Diagnosis not present

## 2016-09-03 DIAGNOSIS — M1991 Primary osteoarthritis, unspecified site: Secondary | ICD-10-CM | POA: Diagnosis not present

## 2016-09-03 DIAGNOSIS — R251 Tremor, unspecified: Secondary | ICD-10-CM | POA: Diagnosis not present

## 2016-09-03 DIAGNOSIS — J9612 Chronic respiratory failure with hypercapnia: Secondary | ICD-10-CM | POA: Diagnosis not present

## 2016-09-03 DIAGNOSIS — R296 Repeated falls: Secondary | ICD-10-CM | POA: Diagnosis not present

## 2016-09-03 DIAGNOSIS — S91302D Unspecified open wound, left foot, subsequent encounter: Secondary | ICD-10-CM | POA: Diagnosis not present

## 2016-09-06 DIAGNOSIS — D649 Anemia, unspecified: Secondary | ICD-10-CM | POA: Diagnosis not present

## 2016-09-06 DIAGNOSIS — S91302D Unspecified open wound, left foot, subsequent encounter: Secondary | ICD-10-CM | POA: Diagnosis not present

## 2016-09-06 DIAGNOSIS — M1991 Primary osteoarthritis, unspecified site: Secondary | ICD-10-CM | POA: Diagnosis not present

## 2016-09-06 DIAGNOSIS — I10 Essential (primary) hypertension: Secondary | ICD-10-CM | POA: Diagnosis not present

## 2016-09-06 DIAGNOSIS — Z87891 Personal history of nicotine dependence: Secondary | ICD-10-CM | POA: Diagnosis not present

## 2016-09-06 DIAGNOSIS — J449 Chronic obstructive pulmonary disease, unspecified: Secondary | ICD-10-CM | POA: Diagnosis not present

## 2016-09-06 DIAGNOSIS — G309 Alzheimer's disease, unspecified: Secondary | ICD-10-CM | POA: Diagnosis not present

## 2016-09-06 DIAGNOSIS — J9612 Chronic respiratory failure with hypercapnia: Secondary | ICD-10-CM | POA: Diagnosis not present

## 2016-09-06 DIAGNOSIS — R251 Tremor, unspecified: Secondary | ICD-10-CM | POA: Diagnosis not present

## 2016-09-06 DIAGNOSIS — R296 Repeated falls: Secondary | ICD-10-CM | POA: Diagnosis not present

## 2016-09-07 DIAGNOSIS — J449 Chronic obstructive pulmonary disease, unspecified: Secondary | ICD-10-CM | POA: Diagnosis not present

## 2016-09-07 DIAGNOSIS — I10 Essential (primary) hypertension: Secondary | ICD-10-CM | POA: Diagnosis not present

## 2016-09-07 DIAGNOSIS — D649 Anemia, unspecified: Secondary | ICD-10-CM | POA: Diagnosis not present

## 2016-09-07 DIAGNOSIS — Z87891 Personal history of nicotine dependence: Secondary | ICD-10-CM | POA: Diagnosis not present

## 2016-09-07 DIAGNOSIS — J9612 Chronic respiratory failure with hypercapnia: Secondary | ICD-10-CM | POA: Diagnosis not present

## 2016-09-07 DIAGNOSIS — R251 Tremor, unspecified: Secondary | ICD-10-CM | POA: Diagnosis not present

## 2016-09-07 DIAGNOSIS — M1991 Primary osteoarthritis, unspecified site: Secondary | ICD-10-CM | POA: Diagnosis not present

## 2016-09-07 DIAGNOSIS — S91302D Unspecified open wound, left foot, subsequent encounter: Secondary | ICD-10-CM | POA: Diagnosis not present

## 2016-09-07 DIAGNOSIS — R296 Repeated falls: Secondary | ICD-10-CM | POA: Diagnosis not present

## 2016-09-07 DIAGNOSIS — G309 Alzheimer's disease, unspecified: Secondary | ICD-10-CM | POA: Diagnosis not present

## 2016-09-08 DIAGNOSIS — J449 Chronic obstructive pulmonary disease, unspecified: Secondary | ICD-10-CM | POA: Diagnosis not present

## 2016-09-08 DIAGNOSIS — M1991 Primary osteoarthritis, unspecified site: Secondary | ICD-10-CM | POA: Diagnosis not present

## 2016-09-08 DIAGNOSIS — D649 Anemia, unspecified: Secondary | ICD-10-CM | POA: Diagnosis not present

## 2016-09-08 DIAGNOSIS — G309 Alzheimer's disease, unspecified: Secondary | ICD-10-CM | POA: Diagnosis not present

## 2016-09-08 DIAGNOSIS — S91302D Unspecified open wound, left foot, subsequent encounter: Secondary | ICD-10-CM | POA: Diagnosis not present

## 2016-09-08 DIAGNOSIS — J9612 Chronic respiratory failure with hypercapnia: Secondary | ICD-10-CM | POA: Diagnosis not present

## 2016-09-08 DIAGNOSIS — R251 Tremor, unspecified: Secondary | ICD-10-CM | POA: Diagnosis not present

## 2016-09-08 DIAGNOSIS — Z87891 Personal history of nicotine dependence: Secondary | ICD-10-CM | POA: Diagnosis not present

## 2016-09-08 DIAGNOSIS — I10 Essential (primary) hypertension: Secondary | ICD-10-CM | POA: Diagnosis not present

## 2016-09-08 DIAGNOSIS — R296 Repeated falls: Secondary | ICD-10-CM | POA: Diagnosis not present

## 2016-09-10 DIAGNOSIS — I10 Essential (primary) hypertension: Secondary | ICD-10-CM | POA: Diagnosis not present

## 2016-09-10 DIAGNOSIS — J9612 Chronic respiratory failure with hypercapnia: Secondary | ICD-10-CM | POA: Diagnosis not present

## 2016-09-10 DIAGNOSIS — G309 Alzheimer's disease, unspecified: Secondary | ICD-10-CM | POA: Diagnosis not present

## 2016-09-10 DIAGNOSIS — M1991 Primary osteoarthritis, unspecified site: Secondary | ICD-10-CM | POA: Diagnosis not present

## 2016-09-10 DIAGNOSIS — Z87891 Personal history of nicotine dependence: Secondary | ICD-10-CM | POA: Diagnosis not present

## 2016-09-10 DIAGNOSIS — R251 Tremor, unspecified: Secondary | ICD-10-CM | POA: Diagnosis not present

## 2016-09-10 DIAGNOSIS — S91302D Unspecified open wound, left foot, subsequent encounter: Secondary | ICD-10-CM | POA: Diagnosis not present

## 2016-09-10 DIAGNOSIS — D649 Anemia, unspecified: Secondary | ICD-10-CM | POA: Diagnosis not present

## 2016-09-10 DIAGNOSIS — R296 Repeated falls: Secondary | ICD-10-CM | POA: Diagnosis not present

## 2016-09-10 DIAGNOSIS — J449 Chronic obstructive pulmonary disease, unspecified: Secondary | ICD-10-CM | POA: Diagnosis not present

## 2016-09-14 DIAGNOSIS — J9612 Chronic respiratory failure with hypercapnia: Secondary | ICD-10-CM | POA: Diagnosis not present

## 2016-09-14 DIAGNOSIS — R296 Repeated falls: Secondary | ICD-10-CM | POA: Diagnosis not present

## 2016-09-14 DIAGNOSIS — I10 Essential (primary) hypertension: Secondary | ICD-10-CM | POA: Diagnosis not present

## 2016-09-14 DIAGNOSIS — R300 Dysuria: Secondary | ICD-10-CM | POA: Diagnosis not present

## 2016-09-14 DIAGNOSIS — M1991 Primary osteoarthritis, unspecified site: Secondary | ICD-10-CM | POA: Diagnosis not present

## 2016-09-14 DIAGNOSIS — G309 Alzheimer's disease, unspecified: Secondary | ICD-10-CM | POA: Diagnosis not present

## 2016-09-14 DIAGNOSIS — R251 Tremor, unspecified: Secondary | ICD-10-CM | POA: Diagnosis not present

## 2016-09-14 DIAGNOSIS — J449 Chronic obstructive pulmonary disease, unspecified: Secondary | ICD-10-CM | POA: Diagnosis not present

## 2016-09-14 DIAGNOSIS — Z87891 Personal history of nicotine dependence: Secondary | ICD-10-CM | POA: Diagnosis not present

## 2016-09-14 DIAGNOSIS — D649 Anemia, unspecified: Secondary | ICD-10-CM | POA: Diagnosis not present

## 2016-09-14 DIAGNOSIS — S91302D Unspecified open wound, left foot, subsequent encounter: Secondary | ICD-10-CM | POA: Diagnosis not present

## 2016-09-15 DIAGNOSIS — Z79891 Long term (current) use of opiate analgesic: Secondary | ICD-10-CM | POA: Diagnosis not present

## 2016-09-16 DIAGNOSIS — I10 Essential (primary) hypertension: Secondary | ICD-10-CM | POA: Diagnosis not present

## 2016-09-16 DIAGNOSIS — J449 Chronic obstructive pulmonary disease, unspecified: Secondary | ICD-10-CM | POA: Diagnosis not present

## 2016-09-16 DIAGNOSIS — R296 Repeated falls: Secondary | ICD-10-CM | POA: Diagnosis not present

## 2016-09-16 DIAGNOSIS — G309 Alzheimer's disease, unspecified: Secondary | ICD-10-CM | POA: Diagnosis not present

## 2016-09-16 DIAGNOSIS — Z87891 Personal history of nicotine dependence: Secondary | ICD-10-CM | POA: Diagnosis not present

## 2016-09-16 DIAGNOSIS — R251 Tremor, unspecified: Secondary | ICD-10-CM | POA: Diagnosis not present

## 2016-09-16 DIAGNOSIS — S91302D Unspecified open wound, left foot, subsequent encounter: Secondary | ICD-10-CM | POA: Diagnosis not present

## 2016-09-16 DIAGNOSIS — M1991 Primary osteoarthritis, unspecified site: Secondary | ICD-10-CM | POA: Diagnosis not present

## 2016-09-16 DIAGNOSIS — J9612 Chronic respiratory failure with hypercapnia: Secondary | ICD-10-CM | POA: Diagnosis not present

## 2016-09-16 DIAGNOSIS — D649 Anemia, unspecified: Secondary | ICD-10-CM | POA: Diagnosis not present

## 2016-09-17 DIAGNOSIS — G309 Alzheimer's disease, unspecified: Secondary | ICD-10-CM | POA: Diagnosis not present

## 2016-09-17 DIAGNOSIS — I10 Essential (primary) hypertension: Secondary | ICD-10-CM | POA: Diagnosis not present

## 2016-09-17 DIAGNOSIS — M1991 Primary osteoarthritis, unspecified site: Secondary | ICD-10-CM | POA: Diagnosis not present

## 2016-09-17 DIAGNOSIS — F5101 Primary insomnia: Secondary | ICD-10-CM | POA: Diagnosis not present

## 2016-09-17 DIAGNOSIS — J9612 Chronic respiratory failure with hypercapnia: Secondary | ICD-10-CM | POA: Diagnosis not present

## 2016-09-17 DIAGNOSIS — S91302D Unspecified open wound, left foot, subsequent encounter: Secondary | ICD-10-CM | POA: Diagnosis not present

## 2016-09-17 DIAGNOSIS — J449 Chronic obstructive pulmonary disease, unspecified: Secondary | ICD-10-CM | POA: Diagnosis not present

## 2016-09-17 DIAGNOSIS — R251 Tremor, unspecified: Secondary | ICD-10-CM | POA: Diagnosis not present

## 2016-09-17 DIAGNOSIS — R296 Repeated falls: Secondary | ICD-10-CM | POA: Diagnosis not present

## 2016-09-17 DIAGNOSIS — Z87891 Personal history of nicotine dependence: Secondary | ICD-10-CM | POA: Diagnosis not present

## 2016-09-17 DIAGNOSIS — D649 Anemia, unspecified: Secondary | ICD-10-CM | POA: Diagnosis not present

## 2016-09-21 DIAGNOSIS — J9612 Chronic respiratory failure with hypercapnia: Secondary | ICD-10-CM | POA: Diagnosis not present

## 2016-09-21 DIAGNOSIS — I10 Essential (primary) hypertension: Secondary | ICD-10-CM | POA: Diagnosis not present

## 2016-09-21 DIAGNOSIS — J449 Chronic obstructive pulmonary disease, unspecified: Secondary | ICD-10-CM | POA: Diagnosis not present

## 2016-09-21 DIAGNOSIS — S91302D Unspecified open wound, left foot, subsequent encounter: Secondary | ICD-10-CM | POA: Diagnosis not present

## 2016-09-21 DIAGNOSIS — R0989 Other specified symptoms and signs involving the circulatory and respiratory systems: Secondary | ICD-10-CM | POA: Diagnosis not present

## 2016-09-21 DIAGNOSIS — R296 Repeated falls: Secondary | ICD-10-CM | POA: Diagnosis not present

## 2016-09-21 DIAGNOSIS — D649 Anemia, unspecified: Secondary | ICD-10-CM | POA: Diagnosis not present

## 2016-09-21 DIAGNOSIS — Z87891 Personal history of nicotine dependence: Secondary | ICD-10-CM | POA: Diagnosis not present

## 2016-09-21 DIAGNOSIS — G309 Alzheimer's disease, unspecified: Secondary | ICD-10-CM | POA: Diagnosis not present

## 2016-09-21 DIAGNOSIS — R05 Cough: Secondary | ICD-10-CM | POA: Diagnosis not present

## 2016-09-21 DIAGNOSIS — M1991 Primary osteoarthritis, unspecified site: Secondary | ICD-10-CM | POA: Diagnosis not present

## 2016-09-21 DIAGNOSIS — R251 Tremor, unspecified: Secondary | ICD-10-CM | POA: Diagnosis not present

## 2016-09-23 DIAGNOSIS — J449 Chronic obstructive pulmonary disease, unspecified: Secondary | ICD-10-CM | POA: Diagnosis not present

## 2016-09-23 DIAGNOSIS — R251 Tremor, unspecified: Secondary | ICD-10-CM | POA: Diagnosis not present

## 2016-09-23 DIAGNOSIS — Z87891 Personal history of nicotine dependence: Secondary | ICD-10-CM | POA: Diagnosis not present

## 2016-09-23 DIAGNOSIS — I10 Essential (primary) hypertension: Secondary | ICD-10-CM | POA: Diagnosis not present

## 2016-09-23 DIAGNOSIS — M1991 Primary osteoarthritis, unspecified site: Secondary | ICD-10-CM | POA: Diagnosis not present

## 2016-09-23 DIAGNOSIS — D649 Anemia, unspecified: Secondary | ICD-10-CM | POA: Diagnosis not present

## 2016-09-23 DIAGNOSIS — J9612 Chronic respiratory failure with hypercapnia: Secondary | ICD-10-CM | POA: Diagnosis not present

## 2016-09-23 DIAGNOSIS — G309 Alzheimer's disease, unspecified: Secondary | ICD-10-CM | POA: Diagnosis not present

## 2016-09-23 DIAGNOSIS — S91302D Unspecified open wound, left foot, subsequent encounter: Secondary | ICD-10-CM | POA: Diagnosis not present

## 2016-09-23 DIAGNOSIS — R296 Repeated falls: Secondary | ICD-10-CM | POA: Diagnosis not present

## 2016-09-24 DIAGNOSIS — J849 Interstitial pulmonary disease, unspecified: Secondary | ICD-10-CM | POA: Diagnosis not present

## 2016-09-24 DIAGNOSIS — R251 Tremor, unspecified: Secondary | ICD-10-CM | POA: Diagnosis not present

## 2016-09-24 DIAGNOSIS — Z888 Allergy status to other drugs, medicaments and biological substances status: Secondary | ICD-10-CM | POA: Diagnosis not present

## 2016-09-24 DIAGNOSIS — I10 Essential (primary) hypertension: Secondary | ICD-10-CM | POA: Diagnosis not present

## 2016-09-24 DIAGNOSIS — G309 Alzheimer's disease, unspecified: Secondary | ICD-10-CM | POA: Diagnosis not present

## 2016-09-24 DIAGNOSIS — S0990XA Unspecified injury of head, initial encounter: Secondary | ICD-10-CM | POA: Diagnosis not present

## 2016-09-24 DIAGNOSIS — H5712 Ocular pain, left eye: Secondary | ICD-10-CM | POA: Diagnosis not present

## 2016-09-24 DIAGNOSIS — Z91012 Allergy to eggs: Secondary | ICD-10-CM | POA: Diagnosis not present

## 2016-09-24 DIAGNOSIS — R269 Unspecified abnormalities of gait and mobility: Secondary | ICD-10-CM | POA: Diagnosis not present

## 2016-09-24 DIAGNOSIS — J323 Chronic sphenoidal sinusitis: Secondary | ICD-10-CM | POA: Diagnosis not present

## 2016-09-24 DIAGNOSIS — Z885 Allergy status to narcotic agent status: Secondary | ICD-10-CM | POA: Diagnosis not present

## 2016-09-24 DIAGNOSIS — Z883 Allergy status to other anti-infective agents status: Secondary | ICD-10-CM | POA: Diagnosis not present

## 2016-09-24 DIAGNOSIS — Z87891 Personal history of nicotine dependence: Secondary | ICD-10-CM | POA: Diagnosis not present

## 2016-09-24 DIAGNOSIS — J9612 Chronic respiratory failure with hypercapnia: Secondary | ICD-10-CM | POA: Diagnosis not present

## 2016-09-24 DIAGNOSIS — K219 Gastro-esophageal reflux disease without esophagitis: Secondary | ICD-10-CM | POA: Diagnosis not present

## 2016-09-24 DIAGNOSIS — S0093XA Contusion of unspecified part of head, initial encounter: Secondary | ICD-10-CM | POA: Diagnosis not present

## 2016-09-24 DIAGNOSIS — W06XXXA Fall from bed, initial encounter: Secondary | ICD-10-CM | POA: Diagnosis not present

## 2016-09-24 DIAGNOSIS — M25552 Pain in left hip: Secondary | ICD-10-CM | POA: Diagnosis not present

## 2016-09-24 DIAGNOSIS — S79912A Unspecified injury of left hip, initial encounter: Secondary | ICD-10-CM | POA: Diagnosis not present

## 2016-09-24 DIAGNOSIS — Z880 Allergy status to penicillin: Secondary | ICD-10-CM | POA: Diagnosis not present

## 2016-09-24 DIAGNOSIS — Z79899 Other long term (current) drug therapy: Secondary | ICD-10-CM | POA: Diagnosis not present

## 2016-09-24 DIAGNOSIS — M5030 Other cervical disc degeneration, unspecified cervical region: Secondary | ICD-10-CM | POA: Diagnosis not present

## 2016-09-24 DIAGNOSIS — D649 Anemia, unspecified: Secondary | ICD-10-CM | POA: Diagnosis not present

## 2016-09-24 DIAGNOSIS — M1612 Unilateral primary osteoarthritis, left hip: Secondary | ICD-10-CM | POA: Diagnosis not present

## 2016-09-24 DIAGNOSIS — R296 Repeated falls: Secondary | ICD-10-CM | POA: Diagnosis not present

## 2016-09-24 DIAGNOSIS — M1991 Primary osteoarthritis, unspecified site: Secondary | ICD-10-CM | POA: Diagnosis not present

## 2016-09-24 DIAGNOSIS — J449 Chronic obstructive pulmonary disease, unspecified: Secondary | ICD-10-CM | POA: Diagnosis not present

## 2016-09-24 DIAGNOSIS — S91302D Unspecified open wound, left foot, subsequent encounter: Secondary | ICD-10-CM | POA: Diagnosis not present

## 2016-09-24 DIAGNOSIS — S0083XA Contusion of other part of head, initial encounter: Secondary | ICD-10-CM | POA: Diagnosis not present

## 2016-09-24 DIAGNOSIS — Z043 Encounter for examination and observation following other accident: Secondary | ICD-10-CM | POA: Diagnosis not present

## 2016-09-24 DIAGNOSIS — M1990 Unspecified osteoarthritis, unspecified site: Secondary | ICD-10-CM | POA: Diagnosis not present

## 2016-09-24 DIAGNOSIS — M47892 Other spondylosis, cervical region: Secondary | ICD-10-CM | POA: Diagnosis not present

## 2016-09-27 DIAGNOSIS — M1991 Primary osteoarthritis, unspecified site: Secondary | ICD-10-CM | POA: Diagnosis not present

## 2016-09-27 DIAGNOSIS — D649 Anemia, unspecified: Secondary | ICD-10-CM | POA: Diagnosis not present

## 2016-09-27 DIAGNOSIS — G309 Alzheimer's disease, unspecified: Secondary | ICD-10-CM | POA: Diagnosis not present

## 2016-09-27 DIAGNOSIS — I10 Essential (primary) hypertension: Secondary | ICD-10-CM | POA: Diagnosis not present

## 2016-09-27 DIAGNOSIS — Z87891 Personal history of nicotine dependence: Secondary | ICD-10-CM | POA: Diagnosis not present

## 2016-09-27 DIAGNOSIS — J9612 Chronic respiratory failure with hypercapnia: Secondary | ICD-10-CM | POA: Diagnosis not present

## 2016-09-27 DIAGNOSIS — J449 Chronic obstructive pulmonary disease, unspecified: Secondary | ICD-10-CM | POA: Diagnosis not present

## 2016-09-27 DIAGNOSIS — R251 Tremor, unspecified: Secondary | ICD-10-CM | POA: Diagnosis not present

## 2016-09-27 DIAGNOSIS — S91302D Unspecified open wound, left foot, subsequent encounter: Secondary | ICD-10-CM | POA: Diagnosis not present

## 2016-09-27 DIAGNOSIS — R296 Repeated falls: Secondary | ICD-10-CM | POA: Diagnosis not present

## 2016-09-29 DIAGNOSIS — D649 Anemia, unspecified: Secondary | ICD-10-CM | POA: Diagnosis not present

## 2016-09-29 DIAGNOSIS — R251 Tremor, unspecified: Secondary | ICD-10-CM | POA: Diagnosis not present

## 2016-09-29 DIAGNOSIS — S91302D Unspecified open wound, left foot, subsequent encounter: Secondary | ICD-10-CM | POA: Diagnosis not present

## 2016-09-29 DIAGNOSIS — Z87891 Personal history of nicotine dependence: Secondary | ICD-10-CM | POA: Diagnosis not present

## 2016-09-29 DIAGNOSIS — R296 Repeated falls: Secondary | ICD-10-CM | POA: Diagnosis not present

## 2016-09-29 DIAGNOSIS — J449 Chronic obstructive pulmonary disease, unspecified: Secondary | ICD-10-CM | POA: Diagnosis not present

## 2016-09-29 DIAGNOSIS — J9612 Chronic respiratory failure with hypercapnia: Secondary | ICD-10-CM | POA: Diagnosis not present

## 2016-09-29 DIAGNOSIS — M1991 Primary osteoarthritis, unspecified site: Secondary | ICD-10-CM | POA: Diagnosis not present

## 2016-09-29 DIAGNOSIS — G309 Alzheimer's disease, unspecified: Secondary | ICD-10-CM | POA: Diagnosis not present

## 2016-09-29 DIAGNOSIS — I10 Essential (primary) hypertension: Secondary | ICD-10-CM | POA: Diagnosis not present

## 2016-09-30 DIAGNOSIS — M25552 Pain in left hip: Secondary | ICD-10-CM | POA: Diagnosis not present

## 2016-09-30 DIAGNOSIS — R296 Repeated falls: Secondary | ICD-10-CM | POA: Diagnosis not present

## 2016-10-01 DIAGNOSIS — D649 Anemia, unspecified: Secondary | ICD-10-CM | POA: Diagnosis not present

## 2016-10-01 DIAGNOSIS — R531 Weakness: Secondary | ICD-10-CM | POA: Diagnosis not present

## 2016-10-01 DIAGNOSIS — Z885 Allergy status to narcotic agent status: Secondary | ICD-10-CM | POA: Diagnosis not present

## 2016-10-01 DIAGNOSIS — G309 Alzheimer's disease, unspecified: Secondary | ICD-10-CM | POA: Diagnosis not present

## 2016-10-01 DIAGNOSIS — L89891 Pressure ulcer of other site, stage 1: Secondary | ICD-10-CM | POA: Diagnosis not present

## 2016-10-01 DIAGNOSIS — J9612 Chronic respiratory failure with hypercapnia: Secondary | ICD-10-CM | POA: Diagnosis not present

## 2016-10-01 DIAGNOSIS — J328 Other chronic sinusitis: Secondary | ICD-10-CM | POA: Diagnosis not present

## 2016-10-01 DIAGNOSIS — W19XXXA Unspecified fall, initial encounter: Secondary | ICD-10-CM | POA: Diagnosis not present

## 2016-10-01 DIAGNOSIS — Z883 Allergy status to other anti-infective agents status: Secondary | ICD-10-CM | POA: Diagnosis not present

## 2016-10-01 DIAGNOSIS — Z91011 Allergy to milk products: Secondary | ICD-10-CM | POA: Diagnosis not present

## 2016-10-01 DIAGNOSIS — M25559 Pain in unspecified hip: Secondary | ICD-10-CM | POA: Diagnosis not present

## 2016-10-01 DIAGNOSIS — N3090 Cystitis, unspecified without hematuria: Secondary | ICD-10-CM | POA: Diagnosis not present

## 2016-10-01 DIAGNOSIS — S91302D Unspecified open wound, left foot, subsequent encounter: Secondary | ICD-10-CM | POA: Diagnosis not present

## 2016-10-01 DIAGNOSIS — I10 Essential (primary) hypertension: Secondary | ICD-10-CM | POA: Diagnosis not present

## 2016-10-01 DIAGNOSIS — R296 Repeated falls: Secondary | ICD-10-CM | POA: Diagnosis not present

## 2016-10-01 DIAGNOSIS — Z880 Allergy status to penicillin: Secondary | ICD-10-CM | POA: Diagnosis not present

## 2016-10-01 DIAGNOSIS — M25552 Pain in left hip: Secondary | ICD-10-CM | POA: Diagnosis not present

## 2016-10-01 DIAGNOSIS — R51 Headache: Secondary | ICD-10-CM | POA: Diagnosis not present

## 2016-10-01 DIAGNOSIS — R300 Dysuria: Secondary | ICD-10-CM | POA: Diagnosis not present

## 2016-10-01 DIAGNOSIS — Z87891 Personal history of nicotine dependence: Secondary | ICD-10-CM | POA: Diagnosis not present

## 2016-10-01 DIAGNOSIS — K219 Gastro-esophageal reflux disease without esophagitis: Secondary | ICD-10-CM | POA: Diagnosis not present

## 2016-10-01 DIAGNOSIS — J449 Chronic obstructive pulmonary disease, unspecified: Secondary | ICD-10-CM | POA: Diagnosis not present

## 2016-10-01 DIAGNOSIS — L89621 Pressure ulcer of left heel, stage 1: Secondary | ICD-10-CM | POA: Diagnosis not present

## 2016-10-01 DIAGNOSIS — M1991 Primary osteoarthritis, unspecified site: Secondary | ICD-10-CM | POA: Diagnosis not present

## 2016-10-01 DIAGNOSIS — Z79899 Other long term (current) drug therapy: Secondary | ICD-10-CM | POA: Diagnosis not present

## 2016-10-01 DIAGNOSIS — S0990XA Unspecified injury of head, initial encounter: Secondary | ICD-10-CM | POA: Diagnosis not present

## 2016-10-01 DIAGNOSIS — R251 Tremor, unspecified: Secondary | ICD-10-CM | POA: Diagnosis not present

## 2016-10-01 DIAGNOSIS — Z9181 History of falling: Secondary | ICD-10-CM | POA: Diagnosis not present

## 2016-10-01 DIAGNOSIS — S79912A Unspecified injury of left hip, initial encounter: Secondary | ICD-10-CM | POA: Diagnosis not present

## 2016-10-01 DIAGNOSIS — M1990 Unspecified osteoarthritis, unspecified site: Secondary | ICD-10-CM | POA: Diagnosis not present

## 2016-10-01 DIAGNOSIS — Z888 Allergy status to other drugs, medicaments and biological substances status: Secondary | ICD-10-CM | POA: Diagnosis not present

## 2016-10-01 DIAGNOSIS — M1612 Unilateral primary osteoarthritis, left hip: Secondary | ICD-10-CM | POA: Diagnosis not present

## 2016-10-01 DIAGNOSIS — G319 Degenerative disease of nervous system, unspecified: Secondary | ICD-10-CM | POA: Diagnosis not present

## 2016-10-04 DIAGNOSIS — D649 Anemia, unspecified: Secondary | ICD-10-CM | POA: Diagnosis not present

## 2016-10-04 DIAGNOSIS — S91302D Unspecified open wound, left foot, subsequent encounter: Secondary | ICD-10-CM | POA: Diagnosis not present

## 2016-10-04 DIAGNOSIS — J449 Chronic obstructive pulmonary disease, unspecified: Secondary | ICD-10-CM | POA: Diagnosis not present

## 2016-10-04 DIAGNOSIS — Z87891 Personal history of nicotine dependence: Secondary | ICD-10-CM | POA: Diagnosis not present

## 2016-10-04 DIAGNOSIS — I10 Essential (primary) hypertension: Secondary | ICD-10-CM | POA: Diagnosis not present

## 2016-10-04 DIAGNOSIS — M1991 Primary osteoarthritis, unspecified site: Secondary | ICD-10-CM | POA: Diagnosis not present

## 2016-10-04 DIAGNOSIS — J9612 Chronic respiratory failure with hypercapnia: Secondary | ICD-10-CM | POA: Diagnosis not present

## 2016-10-04 DIAGNOSIS — R296 Repeated falls: Secondary | ICD-10-CM | POA: Diagnosis not present

## 2016-10-04 DIAGNOSIS — R251 Tremor, unspecified: Secondary | ICD-10-CM | POA: Diagnosis not present

## 2016-10-04 DIAGNOSIS — G309 Alzheimer's disease, unspecified: Secondary | ICD-10-CM | POA: Diagnosis not present

## 2016-10-06 DIAGNOSIS — J9612 Chronic respiratory failure with hypercapnia: Secondary | ICD-10-CM | POA: Diagnosis not present

## 2016-10-06 DIAGNOSIS — S91302D Unspecified open wound, left foot, subsequent encounter: Secondary | ICD-10-CM | POA: Diagnosis not present

## 2016-10-06 DIAGNOSIS — R296 Repeated falls: Secondary | ICD-10-CM | POA: Diagnosis not present

## 2016-10-06 DIAGNOSIS — Z87891 Personal history of nicotine dependence: Secondary | ICD-10-CM | POA: Diagnosis not present

## 2016-10-06 DIAGNOSIS — R251 Tremor, unspecified: Secondary | ICD-10-CM | POA: Diagnosis not present

## 2016-10-06 DIAGNOSIS — I10 Essential (primary) hypertension: Secondary | ICD-10-CM | POA: Diagnosis not present

## 2016-10-06 DIAGNOSIS — G309 Alzheimer's disease, unspecified: Secondary | ICD-10-CM | POA: Diagnosis not present

## 2016-10-06 DIAGNOSIS — M1991 Primary osteoarthritis, unspecified site: Secondary | ICD-10-CM | POA: Diagnosis not present

## 2016-10-06 DIAGNOSIS — J449 Chronic obstructive pulmonary disease, unspecified: Secondary | ICD-10-CM | POA: Diagnosis not present

## 2016-10-06 DIAGNOSIS — D649 Anemia, unspecified: Secondary | ICD-10-CM | POA: Diagnosis not present

## 2016-10-07 DIAGNOSIS — I10 Essential (primary) hypertension: Secondary | ICD-10-CM | POA: Diagnosis not present

## 2016-10-07 DIAGNOSIS — D649 Anemia, unspecified: Secondary | ICD-10-CM | POA: Diagnosis not present

## 2016-10-07 DIAGNOSIS — R296 Repeated falls: Secondary | ICD-10-CM | POA: Diagnosis not present

## 2016-10-07 DIAGNOSIS — M1990 Unspecified osteoarthritis, unspecified site: Secondary | ICD-10-CM | POA: Diagnosis not present

## 2016-10-07 DIAGNOSIS — J9612 Chronic respiratory failure with hypercapnia: Secondary | ICD-10-CM | POA: Diagnosis not present

## 2016-10-07 DIAGNOSIS — Z87891 Personal history of nicotine dependence: Secondary | ICD-10-CM | POA: Diagnosis not present

## 2016-10-07 DIAGNOSIS — G894 Chronic pain syndrome: Secondary | ICD-10-CM | POA: Diagnosis not present

## 2016-10-07 DIAGNOSIS — S91302D Unspecified open wound, left foot, subsequent encounter: Secondary | ICD-10-CM | POA: Diagnosis not present

## 2016-10-07 DIAGNOSIS — G309 Alzheimer's disease, unspecified: Secondary | ICD-10-CM | POA: Diagnosis not present

## 2016-10-07 DIAGNOSIS — R251 Tremor, unspecified: Secondary | ICD-10-CM | POA: Diagnosis not present

## 2016-10-07 DIAGNOSIS — M1991 Primary osteoarthritis, unspecified site: Secondary | ICD-10-CM | POA: Diagnosis not present

## 2016-10-07 DIAGNOSIS — J449 Chronic obstructive pulmonary disease, unspecified: Secondary | ICD-10-CM | POA: Diagnosis not present

## 2016-10-08 DIAGNOSIS — Z961 Presence of intraocular lens: Secondary | ICD-10-CM | POA: Diagnosis not present

## 2016-10-08 DIAGNOSIS — H43813 Vitreous degeneration, bilateral: Secondary | ICD-10-CM | POA: Diagnosis not present

## 2016-10-08 DIAGNOSIS — H35033 Hypertensive retinopathy, bilateral: Secondary | ICD-10-CM | POA: Diagnosis not present

## 2016-10-08 DIAGNOSIS — H18413 Arcus senilis, bilateral: Secondary | ICD-10-CM | POA: Diagnosis not present

## 2016-10-08 DIAGNOSIS — H353131 Nonexudative age-related macular degeneration, bilateral, early dry stage: Secondary | ICD-10-CM | POA: Diagnosis not present

## 2016-10-12 DIAGNOSIS — M1991 Primary osteoarthritis, unspecified site: Secondary | ICD-10-CM | POA: Diagnosis not present

## 2016-10-12 DIAGNOSIS — J449 Chronic obstructive pulmonary disease, unspecified: Secondary | ICD-10-CM | POA: Diagnosis not present

## 2016-10-12 DIAGNOSIS — D649 Anemia, unspecified: Secondary | ICD-10-CM | POA: Diagnosis not present

## 2016-10-12 DIAGNOSIS — Z7982 Long term (current) use of aspirin: Secondary | ICD-10-CM | POA: Diagnosis not present

## 2016-10-12 DIAGNOSIS — S91302D Unspecified open wound, left foot, subsequent encounter: Secondary | ICD-10-CM | POA: Diagnosis not present

## 2016-10-12 DIAGNOSIS — I10 Essential (primary) hypertension: Secondary | ICD-10-CM | POA: Diagnosis not present

## 2016-10-12 DIAGNOSIS — G309 Alzheimer's disease, unspecified: Secondary | ICD-10-CM | POA: Diagnosis not present

## 2016-10-12 DIAGNOSIS — J9612 Chronic respiratory failure with hypercapnia: Secondary | ICD-10-CM | POA: Diagnosis not present

## 2016-10-12 DIAGNOSIS — Z87891 Personal history of nicotine dependence: Secondary | ICD-10-CM | POA: Diagnosis not present

## 2016-10-12 DIAGNOSIS — R251 Tremor, unspecified: Secondary | ICD-10-CM | POA: Diagnosis not present

## 2016-10-13 DIAGNOSIS — J9612 Chronic respiratory failure with hypercapnia: Secondary | ICD-10-CM | POA: Diagnosis not present

## 2016-10-13 DIAGNOSIS — R251 Tremor, unspecified: Secondary | ICD-10-CM | POA: Diagnosis not present

## 2016-10-13 DIAGNOSIS — J449 Chronic obstructive pulmonary disease, unspecified: Secondary | ICD-10-CM | POA: Diagnosis not present

## 2016-10-13 DIAGNOSIS — Z87891 Personal history of nicotine dependence: Secondary | ICD-10-CM | POA: Diagnosis not present

## 2016-10-13 DIAGNOSIS — I10 Essential (primary) hypertension: Secondary | ICD-10-CM | POA: Diagnosis not present

## 2016-10-13 DIAGNOSIS — M1991 Primary osteoarthritis, unspecified site: Secondary | ICD-10-CM | POA: Diagnosis not present

## 2016-10-13 DIAGNOSIS — G309 Alzheimer's disease, unspecified: Secondary | ICD-10-CM | POA: Diagnosis not present

## 2016-10-13 DIAGNOSIS — Z7982 Long term (current) use of aspirin: Secondary | ICD-10-CM | POA: Diagnosis not present

## 2016-10-13 DIAGNOSIS — D649 Anemia, unspecified: Secondary | ICD-10-CM | POA: Diagnosis not present

## 2016-10-13 DIAGNOSIS — S91302D Unspecified open wound, left foot, subsequent encounter: Secondary | ICD-10-CM | POA: Diagnosis not present

## 2016-10-14 DIAGNOSIS — S91302D Unspecified open wound, left foot, subsequent encounter: Secondary | ICD-10-CM | POA: Diagnosis not present

## 2016-10-14 DIAGNOSIS — Z87891 Personal history of nicotine dependence: Secondary | ICD-10-CM | POA: Diagnosis not present

## 2016-10-14 DIAGNOSIS — M1991 Primary osteoarthritis, unspecified site: Secondary | ICD-10-CM | POA: Diagnosis not present

## 2016-10-14 DIAGNOSIS — M1990 Unspecified osteoarthritis, unspecified site: Secondary | ICD-10-CM | POA: Diagnosis not present

## 2016-10-14 DIAGNOSIS — R296 Repeated falls: Secondary | ICD-10-CM | POA: Diagnosis not present

## 2016-10-14 DIAGNOSIS — K210 Gastro-esophageal reflux disease with esophagitis: Secondary | ICD-10-CM | POA: Diagnosis not present

## 2016-10-14 DIAGNOSIS — J449 Chronic obstructive pulmonary disease, unspecified: Secondary | ICD-10-CM | POA: Diagnosis not present

## 2016-10-14 DIAGNOSIS — J9612 Chronic respiratory failure with hypercapnia: Secondary | ICD-10-CM | POA: Diagnosis not present

## 2016-10-14 DIAGNOSIS — D649 Anemia, unspecified: Secondary | ICD-10-CM | POA: Diagnosis not present

## 2016-10-14 DIAGNOSIS — G309 Alzheimer's disease, unspecified: Secondary | ICD-10-CM | POA: Diagnosis not present

## 2016-10-14 DIAGNOSIS — I10 Essential (primary) hypertension: Secondary | ICD-10-CM | POA: Diagnosis not present

## 2016-10-14 DIAGNOSIS — R251 Tremor, unspecified: Secondary | ICD-10-CM | POA: Diagnosis not present

## 2016-10-14 DIAGNOSIS — Z7982 Long term (current) use of aspirin: Secondary | ICD-10-CM | POA: Diagnosis not present

## 2016-10-15 DIAGNOSIS — Z87891 Personal history of nicotine dependence: Secondary | ICD-10-CM | POA: Diagnosis not present

## 2016-10-15 DIAGNOSIS — Z7982 Long term (current) use of aspirin: Secondary | ICD-10-CM | POA: Diagnosis not present

## 2016-10-15 DIAGNOSIS — D649 Anemia, unspecified: Secondary | ICD-10-CM | POA: Diagnosis not present

## 2016-10-15 DIAGNOSIS — I10 Essential (primary) hypertension: Secondary | ICD-10-CM | POA: Diagnosis not present

## 2016-10-15 DIAGNOSIS — R251 Tremor, unspecified: Secondary | ICD-10-CM | POA: Diagnosis not present

## 2016-10-15 DIAGNOSIS — J9612 Chronic respiratory failure with hypercapnia: Secondary | ICD-10-CM | POA: Diagnosis not present

## 2016-10-15 DIAGNOSIS — F5101 Primary insomnia: Secondary | ICD-10-CM | POA: Diagnosis not present

## 2016-10-15 DIAGNOSIS — G309 Alzheimer's disease, unspecified: Secondary | ICD-10-CM | POA: Diagnosis not present

## 2016-10-15 DIAGNOSIS — J449 Chronic obstructive pulmonary disease, unspecified: Secondary | ICD-10-CM | POA: Diagnosis not present

## 2016-10-15 DIAGNOSIS — S91302D Unspecified open wound, left foot, subsequent encounter: Secondary | ICD-10-CM | POA: Diagnosis not present

## 2016-10-15 DIAGNOSIS — M1991 Primary osteoarthritis, unspecified site: Secondary | ICD-10-CM | POA: Diagnosis not present

## 2016-10-19 DIAGNOSIS — R251 Tremor, unspecified: Secondary | ICD-10-CM | POA: Diagnosis not present

## 2016-10-19 DIAGNOSIS — D649 Anemia, unspecified: Secondary | ICD-10-CM | POA: Diagnosis not present

## 2016-10-19 DIAGNOSIS — M1991 Primary osteoarthritis, unspecified site: Secondary | ICD-10-CM | POA: Diagnosis not present

## 2016-10-19 DIAGNOSIS — S91302D Unspecified open wound, left foot, subsequent encounter: Secondary | ICD-10-CM | POA: Diagnosis not present

## 2016-10-19 DIAGNOSIS — Z87891 Personal history of nicotine dependence: Secondary | ICD-10-CM | POA: Diagnosis not present

## 2016-10-19 DIAGNOSIS — I10 Essential (primary) hypertension: Secondary | ICD-10-CM | POA: Diagnosis not present

## 2016-10-19 DIAGNOSIS — G309 Alzheimer's disease, unspecified: Secondary | ICD-10-CM | POA: Diagnosis not present

## 2016-10-19 DIAGNOSIS — J9612 Chronic respiratory failure with hypercapnia: Secondary | ICD-10-CM | POA: Diagnosis not present

## 2016-10-19 DIAGNOSIS — Z7982 Long term (current) use of aspirin: Secondary | ICD-10-CM | POA: Diagnosis not present

## 2016-10-19 DIAGNOSIS — J449 Chronic obstructive pulmonary disease, unspecified: Secondary | ICD-10-CM | POA: Diagnosis not present

## 2016-10-20 DIAGNOSIS — G309 Alzheimer's disease, unspecified: Secondary | ICD-10-CM | POA: Diagnosis not present

## 2016-10-20 DIAGNOSIS — D649 Anemia, unspecified: Secondary | ICD-10-CM | POA: Diagnosis not present

## 2016-10-20 DIAGNOSIS — I10 Essential (primary) hypertension: Secondary | ICD-10-CM | POA: Diagnosis not present

## 2016-10-20 DIAGNOSIS — S91302D Unspecified open wound, left foot, subsequent encounter: Secondary | ICD-10-CM | POA: Diagnosis not present

## 2016-10-20 DIAGNOSIS — J449 Chronic obstructive pulmonary disease, unspecified: Secondary | ICD-10-CM | POA: Diagnosis not present

## 2016-10-20 DIAGNOSIS — M1991 Primary osteoarthritis, unspecified site: Secondary | ICD-10-CM | POA: Diagnosis not present

## 2016-10-20 DIAGNOSIS — Z7982 Long term (current) use of aspirin: Secondary | ICD-10-CM | POA: Diagnosis not present

## 2016-10-20 DIAGNOSIS — R251 Tremor, unspecified: Secondary | ICD-10-CM | POA: Diagnosis not present

## 2016-10-20 DIAGNOSIS — J9612 Chronic respiratory failure with hypercapnia: Secondary | ICD-10-CM | POA: Diagnosis not present

## 2016-10-20 DIAGNOSIS — Z87891 Personal history of nicotine dependence: Secondary | ICD-10-CM | POA: Diagnosis not present

## 2016-10-21 DIAGNOSIS — S91302D Unspecified open wound, left foot, subsequent encounter: Secondary | ICD-10-CM | POA: Diagnosis not present

## 2016-10-21 DIAGNOSIS — J9612 Chronic respiratory failure with hypercapnia: Secondary | ICD-10-CM | POA: Diagnosis not present

## 2016-10-21 DIAGNOSIS — G309 Alzheimer's disease, unspecified: Secondary | ICD-10-CM | POA: Diagnosis not present

## 2016-10-21 DIAGNOSIS — Z7982 Long term (current) use of aspirin: Secondary | ICD-10-CM | POA: Diagnosis not present

## 2016-10-21 DIAGNOSIS — Z87891 Personal history of nicotine dependence: Secondary | ICD-10-CM | POA: Diagnosis not present

## 2016-10-21 DIAGNOSIS — R251 Tremor, unspecified: Secondary | ICD-10-CM | POA: Diagnosis not present

## 2016-10-21 DIAGNOSIS — J449 Chronic obstructive pulmonary disease, unspecified: Secondary | ICD-10-CM | POA: Diagnosis not present

## 2016-10-21 DIAGNOSIS — M1991 Primary osteoarthritis, unspecified site: Secondary | ICD-10-CM | POA: Diagnosis not present

## 2016-10-21 DIAGNOSIS — D649 Anemia, unspecified: Secondary | ICD-10-CM | POA: Diagnosis not present

## 2016-10-21 DIAGNOSIS — I10 Essential (primary) hypertension: Secondary | ICD-10-CM | POA: Diagnosis not present

## 2016-10-22 DIAGNOSIS — Z87891 Personal history of nicotine dependence: Secondary | ICD-10-CM | POA: Diagnosis not present

## 2016-10-22 DIAGNOSIS — I10 Essential (primary) hypertension: Secondary | ICD-10-CM | POA: Diagnosis not present

## 2016-10-22 DIAGNOSIS — J9612 Chronic respiratory failure with hypercapnia: Secondary | ICD-10-CM | POA: Diagnosis not present

## 2016-10-22 DIAGNOSIS — Z7982 Long term (current) use of aspirin: Secondary | ICD-10-CM | POA: Diagnosis not present

## 2016-10-22 DIAGNOSIS — D649 Anemia, unspecified: Secondary | ICD-10-CM | POA: Diagnosis not present

## 2016-10-22 DIAGNOSIS — S91302D Unspecified open wound, left foot, subsequent encounter: Secondary | ICD-10-CM | POA: Diagnosis not present

## 2016-10-22 DIAGNOSIS — R251 Tremor, unspecified: Secondary | ICD-10-CM | POA: Diagnosis not present

## 2016-10-22 DIAGNOSIS — J449 Chronic obstructive pulmonary disease, unspecified: Secondary | ICD-10-CM | POA: Diagnosis not present

## 2016-10-22 DIAGNOSIS — G309 Alzheimer's disease, unspecified: Secondary | ICD-10-CM | POA: Diagnosis not present

## 2016-10-22 DIAGNOSIS — M1991 Primary osteoarthritis, unspecified site: Secondary | ICD-10-CM | POA: Diagnosis not present

## 2016-10-25 DIAGNOSIS — Z87891 Personal history of nicotine dependence: Secondary | ICD-10-CM | POA: Diagnosis not present

## 2016-10-25 DIAGNOSIS — R251 Tremor, unspecified: Secondary | ICD-10-CM | POA: Diagnosis not present

## 2016-10-25 DIAGNOSIS — Z7982 Long term (current) use of aspirin: Secondary | ICD-10-CM | POA: Diagnosis not present

## 2016-10-25 DIAGNOSIS — G309 Alzheimer's disease, unspecified: Secondary | ICD-10-CM | POA: Diagnosis not present

## 2016-10-25 DIAGNOSIS — J449 Chronic obstructive pulmonary disease, unspecified: Secondary | ICD-10-CM | POA: Diagnosis not present

## 2016-10-25 DIAGNOSIS — S91302D Unspecified open wound, left foot, subsequent encounter: Secondary | ICD-10-CM | POA: Diagnosis not present

## 2016-10-25 DIAGNOSIS — M1991 Primary osteoarthritis, unspecified site: Secondary | ICD-10-CM | POA: Diagnosis not present

## 2016-10-25 DIAGNOSIS — I10 Essential (primary) hypertension: Secondary | ICD-10-CM | POA: Diagnosis not present

## 2016-10-25 DIAGNOSIS — J9612 Chronic respiratory failure with hypercapnia: Secondary | ICD-10-CM | POA: Diagnosis not present

## 2016-10-25 DIAGNOSIS — D649 Anemia, unspecified: Secondary | ICD-10-CM | POA: Diagnosis not present

## 2016-10-26 DIAGNOSIS — J449 Chronic obstructive pulmonary disease, unspecified: Secondary | ICD-10-CM | POA: Diagnosis not present

## 2016-10-26 DIAGNOSIS — I10 Essential (primary) hypertension: Secondary | ICD-10-CM | POA: Diagnosis not present

## 2016-10-26 DIAGNOSIS — Z7982 Long term (current) use of aspirin: Secondary | ICD-10-CM | POA: Diagnosis not present

## 2016-10-26 DIAGNOSIS — G309 Alzheimer's disease, unspecified: Secondary | ICD-10-CM | POA: Diagnosis not present

## 2016-10-26 DIAGNOSIS — D649 Anemia, unspecified: Secondary | ICD-10-CM | POA: Diagnosis not present

## 2016-10-26 DIAGNOSIS — J9612 Chronic respiratory failure with hypercapnia: Secondary | ICD-10-CM | POA: Diagnosis not present

## 2016-10-26 DIAGNOSIS — R251 Tremor, unspecified: Secondary | ICD-10-CM | POA: Diagnosis not present

## 2016-10-26 DIAGNOSIS — Z87891 Personal history of nicotine dependence: Secondary | ICD-10-CM | POA: Diagnosis not present

## 2016-10-26 DIAGNOSIS — S91302D Unspecified open wound, left foot, subsequent encounter: Secondary | ICD-10-CM | POA: Diagnosis not present

## 2016-10-26 DIAGNOSIS — M1991 Primary osteoarthritis, unspecified site: Secondary | ICD-10-CM | POA: Diagnosis not present

## 2016-10-27 DIAGNOSIS — S91302D Unspecified open wound, left foot, subsequent encounter: Secondary | ICD-10-CM | POA: Diagnosis not present

## 2016-10-27 DIAGNOSIS — J449 Chronic obstructive pulmonary disease, unspecified: Secondary | ICD-10-CM | POA: Diagnosis not present

## 2016-10-27 DIAGNOSIS — R251 Tremor, unspecified: Secondary | ICD-10-CM | POA: Diagnosis not present

## 2016-10-27 DIAGNOSIS — D649 Anemia, unspecified: Secondary | ICD-10-CM | POA: Diagnosis not present

## 2016-10-27 DIAGNOSIS — J9612 Chronic respiratory failure with hypercapnia: Secondary | ICD-10-CM | POA: Diagnosis not present

## 2016-10-27 DIAGNOSIS — I10 Essential (primary) hypertension: Secondary | ICD-10-CM | POA: Diagnosis not present

## 2016-10-27 DIAGNOSIS — G309 Alzheimer's disease, unspecified: Secondary | ICD-10-CM | POA: Diagnosis not present

## 2016-10-27 DIAGNOSIS — Z7982 Long term (current) use of aspirin: Secondary | ICD-10-CM | POA: Diagnosis not present

## 2016-10-27 DIAGNOSIS — M1991 Primary osteoarthritis, unspecified site: Secondary | ICD-10-CM | POA: Diagnosis not present

## 2016-10-27 DIAGNOSIS — Z87891 Personal history of nicotine dependence: Secondary | ICD-10-CM | POA: Diagnosis not present

## 2016-10-29 DIAGNOSIS — F5101 Primary insomnia: Secondary | ICD-10-CM | POA: Diagnosis not present

## 2016-10-29 DIAGNOSIS — Z7982 Long term (current) use of aspirin: Secondary | ICD-10-CM | POA: Diagnosis not present

## 2016-10-29 DIAGNOSIS — S91302D Unspecified open wound, left foot, subsequent encounter: Secondary | ICD-10-CM | POA: Diagnosis not present

## 2016-10-29 DIAGNOSIS — I10 Essential (primary) hypertension: Secondary | ICD-10-CM | POA: Diagnosis not present

## 2016-10-29 DIAGNOSIS — J449 Chronic obstructive pulmonary disease, unspecified: Secondary | ICD-10-CM | POA: Diagnosis not present

## 2016-10-29 DIAGNOSIS — M1991 Primary osteoarthritis, unspecified site: Secondary | ICD-10-CM | POA: Diagnosis not present

## 2016-10-29 DIAGNOSIS — G309 Alzheimer's disease, unspecified: Secondary | ICD-10-CM | POA: Diagnosis not present

## 2016-10-29 DIAGNOSIS — J9612 Chronic respiratory failure with hypercapnia: Secondary | ICD-10-CM | POA: Diagnosis not present

## 2016-10-29 DIAGNOSIS — Z87891 Personal history of nicotine dependence: Secondary | ICD-10-CM | POA: Diagnosis not present

## 2016-10-29 DIAGNOSIS — D649 Anemia, unspecified: Secondary | ICD-10-CM | POA: Diagnosis not present

## 2016-10-29 DIAGNOSIS — R251 Tremor, unspecified: Secondary | ICD-10-CM | POA: Diagnosis not present

## 2016-10-30 DIAGNOSIS — R251 Tremor, unspecified: Secondary | ICD-10-CM | POA: Diagnosis not present

## 2016-10-30 DIAGNOSIS — S91302D Unspecified open wound, left foot, subsequent encounter: Secondary | ICD-10-CM | POA: Diagnosis not present

## 2016-10-30 DIAGNOSIS — Z7982 Long term (current) use of aspirin: Secondary | ICD-10-CM | POA: Diagnosis not present

## 2016-10-30 DIAGNOSIS — M1991 Primary osteoarthritis, unspecified site: Secondary | ICD-10-CM | POA: Diagnosis not present

## 2016-10-30 DIAGNOSIS — J9612 Chronic respiratory failure with hypercapnia: Secondary | ICD-10-CM | POA: Diagnosis not present

## 2016-10-30 DIAGNOSIS — G309 Alzheimer's disease, unspecified: Secondary | ICD-10-CM | POA: Diagnosis not present

## 2016-10-30 DIAGNOSIS — Z87891 Personal history of nicotine dependence: Secondary | ICD-10-CM | POA: Diagnosis not present

## 2016-10-30 DIAGNOSIS — I10 Essential (primary) hypertension: Secondary | ICD-10-CM | POA: Diagnosis not present

## 2016-10-30 DIAGNOSIS — D649 Anemia, unspecified: Secondary | ICD-10-CM | POA: Diagnosis not present

## 2016-10-30 DIAGNOSIS — J449 Chronic obstructive pulmonary disease, unspecified: Secondary | ICD-10-CM | POA: Diagnosis not present

## 2016-11-01 DIAGNOSIS — M1991 Primary osteoarthritis, unspecified site: Secondary | ICD-10-CM | POA: Diagnosis not present

## 2016-11-01 DIAGNOSIS — Z87891 Personal history of nicotine dependence: Secondary | ICD-10-CM | POA: Diagnosis not present

## 2016-11-01 DIAGNOSIS — J9612 Chronic respiratory failure with hypercapnia: Secondary | ICD-10-CM | POA: Diagnosis not present

## 2016-11-01 DIAGNOSIS — S91302D Unspecified open wound, left foot, subsequent encounter: Secondary | ICD-10-CM | POA: Diagnosis not present

## 2016-11-01 DIAGNOSIS — J449 Chronic obstructive pulmonary disease, unspecified: Secondary | ICD-10-CM | POA: Diagnosis not present

## 2016-11-01 DIAGNOSIS — D649 Anemia, unspecified: Secondary | ICD-10-CM | POA: Diagnosis not present

## 2016-11-01 DIAGNOSIS — R251 Tremor, unspecified: Secondary | ICD-10-CM | POA: Diagnosis not present

## 2016-11-01 DIAGNOSIS — I10 Essential (primary) hypertension: Secondary | ICD-10-CM | POA: Diagnosis not present

## 2016-11-01 DIAGNOSIS — Z7982 Long term (current) use of aspirin: Secondary | ICD-10-CM | POA: Diagnosis not present

## 2016-11-01 DIAGNOSIS — G309 Alzheimer's disease, unspecified: Secondary | ICD-10-CM | POA: Diagnosis not present

## 2016-11-03 DIAGNOSIS — I10 Essential (primary) hypertension: Secondary | ICD-10-CM | POA: Diagnosis not present

## 2016-11-03 DIAGNOSIS — D649 Anemia, unspecified: Secondary | ICD-10-CM | POA: Diagnosis not present

## 2016-11-03 DIAGNOSIS — S91302D Unspecified open wound, left foot, subsequent encounter: Secondary | ICD-10-CM | POA: Diagnosis not present

## 2016-11-03 DIAGNOSIS — Z7982 Long term (current) use of aspirin: Secondary | ICD-10-CM | POA: Diagnosis not present

## 2016-11-03 DIAGNOSIS — J449 Chronic obstructive pulmonary disease, unspecified: Secondary | ICD-10-CM | POA: Diagnosis not present

## 2016-11-03 DIAGNOSIS — M1991 Primary osteoarthritis, unspecified site: Secondary | ICD-10-CM | POA: Diagnosis not present

## 2016-11-03 DIAGNOSIS — G309 Alzheimer's disease, unspecified: Secondary | ICD-10-CM | POA: Diagnosis not present

## 2016-11-03 DIAGNOSIS — Z87891 Personal history of nicotine dependence: Secondary | ICD-10-CM | POA: Diagnosis not present

## 2016-11-03 DIAGNOSIS — R251 Tremor, unspecified: Secondary | ICD-10-CM | POA: Diagnosis not present

## 2016-11-03 DIAGNOSIS — J9612 Chronic respiratory failure with hypercapnia: Secondary | ICD-10-CM | POA: Diagnosis not present

## 2016-11-04 DIAGNOSIS — M1991 Primary osteoarthritis, unspecified site: Secondary | ICD-10-CM | POA: Diagnosis not present

## 2016-11-04 DIAGNOSIS — J9612 Chronic respiratory failure with hypercapnia: Secondary | ICD-10-CM | POA: Diagnosis not present

## 2016-11-04 DIAGNOSIS — I10 Essential (primary) hypertension: Secondary | ICD-10-CM | POA: Diagnosis not present

## 2016-11-04 DIAGNOSIS — M1990 Unspecified osteoarthritis, unspecified site: Secondary | ICD-10-CM | POA: Diagnosis not present

## 2016-11-04 DIAGNOSIS — Z87891 Personal history of nicotine dependence: Secondary | ICD-10-CM | POA: Diagnosis not present

## 2016-11-04 DIAGNOSIS — R251 Tremor, unspecified: Secondary | ICD-10-CM | POA: Diagnosis not present

## 2016-11-04 DIAGNOSIS — S91302D Unspecified open wound, left foot, subsequent encounter: Secondary | ICD-10-CM | POA: Diagnosis not present

## 2016-11-04 DIAGNOSIS — D649 Anemia, unspecified: Secondary | ICD-10-CM | POA: Diagnosis not present

## 2016-11-04 DIAGNOSIS — G894 Chronic pain syndrome: Secondary | ICD-10-CM | POA: Diagnosis not present

## 2016-11-04 DIAGNOSIS — Z7982 Long term (current) use of aspirin: Secondary | ICD-10-CM | POA: Diagnosis not present

## 2016-11-04 DIAGNOSIS — J449 Chronic obstructive pulmonary disease, unspecified: Secondary | ICD-10-CM | POA: Diagnosis not present

## 2016-11-04 DIAGNOSIS — G309 Alzheimer's disease, unspecified: Secondary | ICD-10-CM | POA: Diagnosis not present

## 2016-11-05 DIAGNOSIS — J449 Chronic obstructive pulmonary disease, unspecified: Secondary | ICD-10-CM | POA: Diagnosis not present

## 2016-11-05 DIAGNOSIS — S91302D Unspecified open wound, left foot, subsequent encounter: Secondary | ICD-10-CM | POA: Diagnosis not present

## 2016-11-05 DIAGNOSIS — M1991 Primary osteoarthritis, unspecified site: Secondary | ICD-10-CM | POA: Diagnosis not present

## 2016-11-05 DIAGNOSIS — Z87891 Personal history of nicotine dependence: Secondary | ICD-10-CM | POA: Diagnosis not present

## 2016-11-05 DIAGNOSIS — G309 Alzheimer's disease, unspecified: Secondary | ICD-10-CM | POA: Diagnosis not present

## 2016-11-05 DIAGNOSIS — Z7982 Long term (current) use of aspirin: Secondary | ICD-10-CM | POA: Diagnosis not present

## 2016-11-05 DIAGNOSIS — I10 Essential (primary) hypertension: Secondary | ICD-10-CM | POA: Diagnosis not present

## 2016-11-05 DIAGNOSIS — R251 Tremor, unspecified: Secondary | ICD-10-CM | POA: Diagnosis not present

## 2016-11-05 DIAGNOSIS — J9612 Chronic respiratory failure with hypercapnia: Secondary | ICD-10-CM | POA: Diagnosis not present

## 2016-11-05 DIAGNOSIS — D649 Anemia, unspecified: Secondary | ICD-10-CM | POA: Diagnosis not present

## 2016-11-07 DIAGNOSIS — J8410 Pulmonary fibrosis, unspecified: Secondary | ICD-10-CM | POA: Diagnosis not present

## 2016-11-07 DIAGNOSIS — S299XXA Unspecified injury of thorax, initial encounter: Secondary | ICD-10-CM | POA: Diagnosis not present

## 2016-11-07 DIAGNOSIS — Z87891 Personal history of nicotine dependence: Secondary | ICD-10-CM | POA: Diagnosis not present

## 2016-11-07 DIAGNOSIS — Z7982 Long term (current) use of aspirin: Secondary | ICD-10-CM | POA: Diagnosis not present

## 2016-11-07 DIAGNOSIS — M7989 Other specified soft tissue disorders: Secondary | ICD-10-CM | POA: Diagnosis not present

## 2016-11-07 DIAGNOSIS — Z79899 Other long term (current) drug therapy: Secondary | ICD-10-CM | POA: Diagnosis not present

## 2016-11-07 DIAGNOSIS — Z9981 Dependence on supplemental oxygen: Secondary | ICD-10-CM | POA: Diagnosis not present

## 2016-11-07 DIAGNOSIS — J440 Chronic obstructive pulmonary disease with acute lower respiratory infection: Secondary | ICD-10-CM | POA: Diagnosis not present

## 2016-11-07 DIAGNOSIS — R0789 Other chest pain: Secondary | ICD-10-CM | POA: Diagnosis not present

## 2016-11-07 DIAGNOSIS — Z66 Do not resuscitate: Secondary | ICD-10-CM | POA: Diagnosis not present

## 2016-11-07 DIAGNOSIS — K219 Gastro-esophageal reflux disease without esophagitis: Secondary | ICD-10-CM | POA: Diagnosis not present

## 2016-11-07 DIAGNOSIS — M25552 Pain in left hip: Secondary | ICD-10-CM | POA: Diagnosis not present

## 2016-11-07 DIAGNOSIS — K5900 Constipation, unspecified: Secondary | ICD-10-CM | POA: Diagnosis not present

## 2016-11-07 DIAGNOSIS — J189 Pneumonia, unspecified organism: Secondary | ICD-10-CM | POA: Diagnosis not present

## 2016-11-07 DIAGNOSIS — J9612 Chronic respiratory failure with hypercapnia: Secondary | ICD-10-CM | POA: Diagnosis not present

## 2016-11-07 DIAGNOSIS — J181 Lobar pneumonia, unspecified organism: Secondary | ICD-10-CM | POA: Diagnosis not present

## 2016-11-07 DIAGNOSIS — Z880 Allergy status to penicillin: Secondary | ICD-10-CM | POA: Diagnosis not present

## 2016-11-07 DIAGNOSIS — J449 Chronic obstructive pulmonary disease, unspecified: Secondary | ICD-10-CM | POA: Diagnosis not present

## 2016-11-07 DIAGNOSIS — W19XXXA Unspecified fall, initial encounter: Secondary | ICD-10-CM | POA: Diagnosis not present

## 2016-11-07 DIAGNOSIS — I10 Essential (primary) hypertension: Secondary | ICD-10-CM | POA: Diagnosis not present

## 2016-11-11 DIAGNOSIS — J449 Chronic obstructive pulmonary disease, unspecified: Secondary | ICD-10-CM | POA: Diagnosis not present

## 2016-11-11 DIAGNOSIS — R197 Diarrhea, unspecified: Secondary | ICD-10-CM | POA: Diagnosis not present

## 2016-11-11 DIAGNOSIS — J189 Pneumonia, unspecified organism: Secondary | ICD-10-CM | POA: Diagnosis not present

## 2016-11-12 DIAGNOSIS — D649 Anemia, unspecified: Secondary | ICD-10-CM | POA: Diagnosis not present

## 2016-11-12 DIAGNOSIS — S91302D Unspecified open wound, left foot, subsequent encounter: Secondary | ICD-10-CM | POA: Diagnosis not present

## 2016-11-12 DIAGNOSIS — I10 Essential (primary) hypertension: Secondary | ICD-10-CM | POA: Diagnosis not present

## 2016-11-12 DIAGNOSIS — J449 Chronic obstructive pulmonary disease, unspecified: Secondary | ICD-10-CM | POA: Diagnosis not present

## 2016-11-12 DIAGNOSIS — Z87891 Personal history of nicotine dependence: Secondary | ICD-10-CM | POA: Diagnosis not present

## 2016-11-12 DIAGNOSIS — M1991 Primary osteoarthritis, unspecified site: Secondary | ICD-10-CM | POA: Diagnosis not present

## 2016-11-12 DIAGNOSIS — J9612 Chronic respiratory failure with hypercapnia: Secondary | ICD-10-CM | POA: Diagnosis not present

## 2016-11-12 DIAGNOSIS — G309 Alzheimer's disease, unspecified: Secondary | ICD-10-CM | POA: Diagnosis not present

## 2016-11-12 DIAGNOSIS — R251 Tremor, unspecified: Secondary | ICD-10-CM | POA: Diagnosis not present

## 2016-11-12 DIAGNOSIS — Z7982 Long term (current) use of aspirin: Secondary | ICD-10-CM | POA: Diagnosis not present

## 2016-11-13 DIAGNOSIS — I10 Essential (primary) hypertension: Secondary | ICD-10-CM | POA: Diagnosis not present

## 2016-11-13 DIAGNOSIS — R251 Tremor, unspecified: Secondary | ICD-10-CM | POA: Diagnosis not present

## 2016-11-13 DIAGNOSIS — Z7982 Long term (current) use of aspirin: Secondary | ICD-10-CM | POA: Diagnosis not present

## 2016-11-13 DIAGNOSIS — D649 Anemia, unspecified: Secondary | ICD-10-CM | POA: Diagnosis not present

## 2016-11-13 DIAGNOSIS — M1991 Primary osteoarthritis, unspecified site: Secondary | ICD-10-CM | POA: Diagnosis not present

## 2016-11-13 DIAGNOSIS — Z87891 Personal history of nicotine dependence: Secondary | ICD-10-CM | POA: Diagnosis not present

## 2016-11-13 DIAGNOSIS — G309 Alzheimer's disease, unspecified: Secondary | ICD-10-CM | POA: Diagnosis not present

## 2016-11-13 DIAGNOSIS — J9612 Chronic respiratory failure with hypercapnia: Secondary | ICD-10-CM | POA: Diagnosis not present

## 2016-11-13 DIAGNOSIS — J449 Chronic obstructive pulmonary disease, unspecified: Secondary | ICD-10-CM | POA: Diagnosis not present

## 2016-11-13 DIAGNOSIS — S91302D Unspecified open wound, left foot, subsequent encounter: Secondary | ICD-10-CM | POA: Diagnosis not present

## 2016-11-15 DIAGNOSIS — J9612 Chronic respiratory failure with hypercapnia: Secondary | ICD-10-CM | POA: Diagnosis not present

## 2016-11-15 DIAGNOSIS — G309 Alzheimer's disease, unspecified: Secondary | ICD-10-CM | POA: Diagnosis not present

## 2016-11-15 DIAGNOSIS — S91302D Unspecified open wound, left foot, subsequent encounter: Secondary | ICD-10-CM | POA: Diagnosis not present

## 2016-11-15 DIAGNOSIS — R251 Tremor, unspecified: Secondary | ICD-10-CM | POA: Diagnosis not present

## 2016-11-15 DIAGNOSIS — M1991 Primary osteoarthritis, unspecified site: Secondary | ICD-10-CM | POA: Diagnosis not present

## 2016-11-15 DIAGNOSIS — Z7982 Long term (current) use of aspirin: Secondary | ICD-10-CM | POA: Diagnosis not present

## 2016-11-15 DIAGNOSIS — D649 Anemia, unspecified: Secondary | ICD-10-CM | POA: Diagnosis not present

## 2016-11-15 DIAGNOSIS — J449 Chronic obstructive pulmonary disease, unspecified: Secondary | ICD-10-CM | POA: Diagnosis not present

## 2016-11-15 DIAGNOSIS — I10 Essential (primary) hypertension: Secondary | ICD-10-CM | POA: Diagnosis not present

## 2016-11-15 DIAGNOSIS — Z87891 Personal history of nicotine dependence: Secondary | ICD-10-CM | POA: Diagnosis not present

## 2016-11-16 ENCOUNTER — Other Ambulatory Visit: Payer: Self-pay

## 2016-11-16 DIAGNOSIS — J449 Chronic obstructive pulmonary disease, unspecified: Secondary | ICD-10-CM | POA: Diagnosis not present

## 2016-11-16 DIAGNOSIS — J9612 Chronic respiratory failure with hypercapnia: Secondary | ICD-10-CM | POA: Diagnosis not present

## 2016-11-16 DIAGNOSIS — R251 Tremor, unspecified: Secondary | ICD-10-CM | POA: Diagnosis not present

## 2016-11-16 DIAGNOSIS — I10 Essential (primary) hypertension: Secondary | ICD-10-CM | POA: Diagnosis not present

## 2016-11-16 DIAGNOSIS — Z87891 Personal history of nicotine dependence: Secondary | ICD-10-CM | POA: Diagnosis not present

## 2016-11-16 DIAGNOSIS — G309 Alzheimer's disease, unspecified: Secondary | ICD-10-CM | POA: Diagnosis not present

## 2016-11-16 DIAGNOSIS — D649 Anemia, unspecified: Secondary | ICD-10-CM | POA: Diagnosis not present

## 2016-11-16 DIAGNOSIS — S91302D Unspecified open wound, left foot, subsequent encounter: Secondary | ICD-10-CM | POA: Diagnosis not present

## 2016-11-16 DIAGNOSIS — M1991 Primary osteoarthritis, unspecified site: Secondary | ICD-10-CM | POA: Diagnosis not present

## 2016-11-16 DIAGNOSIS — Z7982 Long term (current) use of aspirin: Secondary | ICD-10-CM | POA: Diagnosis not present

## 2016-11-16 NOTE — Patient Outreach (Signed)
[facility]) Care Management  11/16/2016  [name] December 24, 1934 [id number]  Transition of care  Week # 1 Referral date: 11/15/16 Referral source: Transition of care status post hospital discharge from [facility] center on 11/10/16 Insurance: [place] care Attempt #2  Telephone call to patient regarding transition of care follow up. Unable to reach patient. HIPAA compliant voice message left with call back phone number.   PLAN: RNCM will attempt 2nd telephone follow up within 3 business days.  Quinn Plowman RN,BSN,CCM [facility] Telephonic  [phone]

## 2016-11-17 ENCOUNTER — Other Ambulatory Visit: Payer: Self-pay

## 2016-11-17 NOTE — Patient Outreach (Signed)
[facility] [facility]) Care Management  11/17/2016  [name] Feb 26, 1935 [id number]   Transition of care  Week # 1 Referral date: 11/15/16 Referral source: Transition of care status post hospital discharge from [facility] center on 11/10/16 Insurance: [place] care Attempt # 2  Telephone call to patient regarding transition of care follow up. Unable to reach patient. HIPAA compliant voice message left with call back phone number.   PLAN: RNCM will attempt 3rd telephone follow up within 3 business days.  Quinn Plowman RN,BSN,CCM [facility] Telephonic  [phone]

## 2016-11-18 ENCOUNTER — Other Ambulatory Visit: Payer: Self-pay

## 2016-11-18 NOTE — Patient Outreach (Addendum)
[place] [facility]) Care Management  11/18/2016  [name] 1935-04-13 [id number]  Transition of care  Week # 1 Referral date: 11/15/16 Referral source: Transition of care status post hospital discharge from [facility] center on 11/10/16 Insurance: [place] care Attempt #3  Telephone call to patient for transition of care follow up. Attempted call to listed home number. Number rang busy.  Attempted listed mobile number. Phone only rang.    PLAN: RNCM will attempt 3rd telephone outreach to patient within 3 business days.  RNCM will send outreach letter to attempt contact.  Quinn Plowman RN,BSN,CCM [facility] Telephonic  [phone]

## 2016-11-19 ENCOUNTER — Ambulatory Visit: Payer: Self-pay

## 2016-11-19 DIAGNOSIS — R251 Tremor, unspecified: Secondary | ICD-10-CM | POA: Diagnosis not present

## 2016-11-19 DIAGNOSIS — D649 Anemia, unspecified: Secondary | ICD-10-CM | POA: Diagnosis not present

## 2016-11-19 DIAGNOSIS — S91302D Unspecified open wound, left foot, subsequent encounter: Secondary | ICD-10-CM | POA: Diagnosis not present

## 2016-11-19 DIAGNOSIS — J9612 Chronic respiratory failure with hypercapnia: Secondary | ICD-10-CM | POA: Diagnosis not present

## 2016-11-19 DIAGNOSIS — Z7982 Long term (current) use of aspirin: Secondary | ICD-10-CM | POA: Diagnosis not present

## 2016-11-19 DIAGNOSIS — I10 Essential (primary) hypertension: Secondary | ICD-10-CM | POA: Diagnosis not present

## 2016-11-19 DIAGNOSIS — M1991 Primary osteoarthritis, unspecified site: Secondary | ICD-10-CM | POA: Diagnosis not present

## 2016-11-19 DIAGNOSIS — J449 Chronic obstructive pulmonary disease, unspecified: Secondary | ICD-10-CM | POA: Diagnosis not present

## 2016-11-19 DIAGNOSIS — G309 Alzheimer's disease, unspecified: Secondary | ICD-10-CM | POA: Diagnosis not present

## 2016-11-19 DIAGNOSIS — Z87891 Personal history of nicotine dependence: Secondary | ICD-10-CM | POA: Diagnosis not present

## 2016-11-23 DIAGNOSIS — G309 Alzheimer's disease, unspecified: Secondary | ICD-10-CM | POA: Diagnosis not present

## 2016-11-23 DIAGNOSIS — I10 Essential (primary) hypertension: Secondary | ICD-10-CM | POA: Diagnosis not present

## 2016-11-23 DIAGNOSIS — J449 Chronic obstructive pulmonary disease, unspecified: Secondary | ICD-10-CM | POA: Diagnosis not present

## 2016-11-23 DIAGNOSIS — M1991 Primary osteoarthritis, unspecified site: Secondary | ICD-10-CM | POA: Diagnosis not present

## 2016-11-23 DIAGNOSIS — R251 Tremor, unspecified: Secondary | ICD-10-CM | POA: Diagnosis not present

## 2016-11-23 DIAGNOSIS — S91302D Unspecified open wound, left foot, subsequent encounter: Secondary | ICD-10-CM | POA: Diagnosis not present

## 2016-11-23 DIAGNOSIS — D649 Anemia, unspecified: Secondary | ICD-10-CM | POA: Diagnosis not present

## 2016-11-23 DIAGNOSIS — J9612 Chronic respiratory failure with hypercapnia: Secondary | ICD-10-CM | POA: Diagnosis not present

## 2016-11-23 DIAGNOSIS — Z87891 Personal history of nicotine dependence: Secondary | ICD-10-CM | POA: Diagnosis not present

## 2016-11-23 DIAGNOSIS — Z7982 Long term (current) use of aspirin: Secondary | ICD-10-CM | POA: Diagnosis not present

## 2016-11-24 DIAGNOSIS — Z87891 Personal history of nicotine dependence: Secondary | ICD-10-CM | POA: Diagnosis not present

## 2016-11-24 DIAGNOSIS — Z7401 Bed confinement status: Secondary | ICD-10-CM | POA: Diagnosis not present

## 2016-11-24 DIAGNOSIS — Z885 Allergy status to narcotic agent status: Secondary | ICD-10-CM | POA: Diagnosis not present

## 2016-11-24 DIAGNOSIS — I10 Essential (primary) hypertension: Secondary | ICD-10-CM | POA: Diagnosis not present

## 2016-11-24 DIAGNOSIS — Z7982 Long term (current) use of aspirin: Secondary | ICD-10-CM | POA: Diagnosis not present

## 2016-11-24 DIAGNOSIS — K219 Gastro-esophageal reflux disease without esophagitis: Secondary | ICD-10-CM | POA: Diagnosis not present

## 2016-11-24 DIAGNOSIS — J449 Chronic obstructive pulmonary disease, unspecified: Secondary | ICD-10-CM | POA: Diagnosis not present

## 2016-11-24 DIAGNOSIS — J189 Pneumonia, unspecified organism: Secondary | ICD-10-CM | POA: Diagnosis not present

## 2016-11-24 DIAGNOSIS — R918 Other nonspecific abnormal finding of lung field: Secondary | ICD-10-CM | POA: Diagnosis not present

## 2016-11-24 DIAGNOSIS — Z91012 Allergy to eggs: Secondary | ICD-10-CM | POA: Diagnosis not present

## 2016-11-24 DIAGNOSIS — Z888 Allergy status to other drugs, medicaments and biological substances status: Secondary | ICD-10-CM | POA: Diagnosis not present

## 2016-11-24 DIAGNOSIS — Z9981 Dependence on supplemental oxygen: Secondary | ICD-10-CM | POA: Diagnosis not present

## 2016-11-24 DIAGNOSIS — R0602 Shortness of breath: Secondary | ICD-10-CM | POA: Diagnosis not present

## 2016-11-24 DIAGNOSIS — Z79899 Other long term (current) drug therapy: Secondary | ICD-10-CM | POA: Diagnosis not present

## 2016-11-24 DIAGNOSIS — Z883 Allergy status to other anti-infective agents status: Secondary | ICD-10-CM | POA: Diagnosis not present

## 2016-11-24 DIAGNOSIS — Z880 Allergy status to penicillin: Secondary | ICD-10-CM | POA: Diagnosis not present

## 2016-11-26 DIAGNOSIS — J449 Chronic obstructive pulmonary disease, unspecified: Secondary | ICD-10-CM | POA: Diagnosis not present

## 2016-11-26 DIAGNOSIS — Z87891 Personal history of nicotine dependence: Secondary | ICD-10-CM | POA: Diagnosis not present

## 2016-11-26 DIAGNOSIS — J9612 Chronic respiratory failure with hypercapnia: Secondary | ICD-10-CM | POA: Diagnosis not present

## 2016-11-26 DIAGNOSIS — M1991 Primary osteoarthritis, unspecified site: Secondary | ICD-10-CM | POA: Diagnosis not present

## 2016-11-26 DIAGNOSIS — R251 Tremor, unspecified: Secondary | ICD-10-CM | POA: Diagnosis not present

## 2016-11-26 DIAGNOSIS — Z7982 Long term (current) use of aspirin: Secondary | ICD-10-CM | POA: Diagnosis not present

## 2016-11-26 DIAGNOSIS — F5101 Primary insomnia: Secondary | ICD-10-CM | POA: Diagnosis not present

## 2016-11-26 DIAGNOSIS — I10 Essential (primary) hypertension: Secondary | ICD-10-CM | POA: Diagnosis not present

## 2016-11-26 DIAGNOSIS — G309 Alzheimer's disease, unspecified: Secondary | ICD-10-CM | POA: Diagnosis not present

## 2016-11-26 DIAGNOSIS — D649 Anemia, unspecified: Secondary | ICD-10-CM | POA: Diagnosis not present

## 2016-11-26 DIAGNOSIS — S91302D Unspecified open wound, left foot, subsequent encounter: Secondary | ICD-10-CM | POA: Diagnosis not present

## 2016-11-30 DIAGNOSIS — M1990 Unspecified osteoarthritis, unspecified site: Secondary | ICD-10-CM | POA: Diagnosis not present

## 2016-11-30 DIAGNOSIS — R251 Tremor, unspecified: Secondary | ICD-10-CM | POA: Diagnosis not present

## 2016-11-30 DIAGNOSIS — J189 Pneumonia, unspecified organism: Secondary | ICD-10-CM | POA: Diagnosis not present

## 2016-11-30 DIAGNOSIS — I10 Essential (primary) hypertension: Secondary | ICD-10-CM | POA: Diagnosis not present

## 2016-11-30 DIAGNOSIS — J9612 Chronic respiratory failure with hypercapnia: Secondary | ICD-10-CM | POA: Diagnosis not present

## 2016-11-30 DIAGNOSIS — S91302D Unspecified open wound, left foot, subsequent encounter: Secondary | ICD-10-CM | POA: Diagnosis not present

## 2016-11-30 DIAGNOSIS — G309 Alzheimer's disease, unspecified: Secondary | ICD-10-CM | POA: Diagnosis not present

## 2016-11-30 DIAGNOSIS — R197 Diarrhea, unspecified: Secondary | ICD-10-CM | POA: Diagnosis not present

## 2016-11-30 DIAGNOSIS — Z7982 Long term (current) use of aspirin: Secondary | ICD-10-CM | POA: Diagnosis not present

## 2016-11-30 DIAGNOSIS — J449 Chronic obstructive pulmonary disease, unspecified: Secondary | ICD-10-CM | POA: Diagnosis not present

## 2016-11-30 DIAGNOSIS — K649 Unspecified hemorrhoids: Secondary | ICD-10-CM | POA: Diagnosis not present

## 2016-11-30 DIAGNOSIS — D649 Anemia, unspecified: Secondary | ICD-10-CM | POA: Diagnosis not present

## 2016-11-30 DIAGNOSIS — M1991 Primary osteoarthritis, unspecified site: Secondary | ICD-10-CM | POA: Diagnosis not present

## 2016-11-30 DIAGNOSIS — Z87891 Personal history of nicotine dependence: Secondary | ICD-10-CM | POA: Diagnosis not present

## 2016-12-01 DIAGNOSIS — Z87891 Personal history of nicotine dependence: Secondary | ICD-10-CM | POA: Diagnosis not present

## 2016-12-01 DIAGNOSIS — M1991 Primary osteoarthritis, unspecified site: Secondary | ICD-10-CM | POA: Diagnosis not present

## 2016-12-01 DIAGNOSIS — J9612 Chronic respiratory failure with hypercapnia: Secondary | ICD-10-CM | POA: Diagnosis not present

## 2016-12-01 DIAGNOSIS — Z7982 Long term (current) use of aspirin: Secondary | ICD-10-CM | POA: Diagnosis not present

## 2016-12-01 DIAGNOSIS — J449 Chronic obstructive pulmonary disease, unspecified: Secondary | ICD-10-CM | POA: Diagnosis not present

## 2016-12-01 DIAGNOSIS — G309 Alzheimer's disease, unspecified: Secondary | ICD-10-CM | POA: Diagnosis not present

## 2016-12-01 DIAGNOSIS — S91302D Unspecified open wound, left foot, subsequent encounter: Secondary | ICD-10-CM | POA: Diagnosis not present

## 2016-12-01 DIAGNOSIS — I10 Essential (primary) hypertension: Secondary | ICD-10-CM | POA: Diagnosis not present

## 2016-12-01 DIAGNOSIS — D649 Anemia, unspecified: Secondary | ICD-10-CM | POA: Diagnosis not present

## 2016-12-01 DIAGNOSIS — R251 Tremor, unspecified: Secondary | ICD-10-CM | POA: Diagnosis not present

## 2016-12-02 DIAGNOSIS — M1991 Primary osteoarthritis, unspecified site: Secondary | ICD-10-CM | POA: Diagnosis not present

## 2016-12-02 DIAGNOSIS — G309 Alzheimer's disease, unspecified: Secondary | ICD-10-CM | POA: Diagnosis not present

## 2016-12-02 DIAGNOSIS — R251 Tremor, unspecified: Secondary | ICD-10-CM | POA: Diagnosis not present

## 2016-12-02 DIAGNOSIS — D649 Anemia, unspecified: Secondary | ICD-10-CM | POA: Diagnosis not present

## 2016-12-02 DIAGNOSIS — S91302D Unspecified open wound, left foot, subsequent encounter: Secondary | ICD-10-CM | POA: Diagnosis not present

## 2016-12-02 DIAGNOSIS — M1990 Unspecified osteoarthritis, unspecified site: Secondary | ICD-10-CM | POA: Diagnosis not present

## 2016-12-02 DIAGNOSIS — Z7982 Long term (current) use of aspirin: Secondary | ICD-10-CM | POA: Diagnosis not present

## 2016-12-02 DIAGNOSIS — J9612 Chronic respiratory failure with hypercapnia: Secondary | ICD-10-CM | POA: Diagnosis not present

## 2016-12-02 DIAGNOSIS — G894 Chronic pain syndrome: Secondary | ICD-10-CM | POA: Diagnosis not present

## 2016-12-02 DIAGNOSIS — Z87891 Personal history of nicotine dependence: Secondary | ICD-10-CM | POA: Diagnosis not present

## 2016-12-02 DIAGNOSIS — J449 Chronic obstructive pulmonary disease, unspecified: Secondary | ICD-10-CM | POA: Diagnosis not present

## 2016-12-02 DIAGNOSIS — I10 Essential (primary) hypertension: Secondary | ICD-10-CM | POA: Diagnosis not present

## 2016-12-06 DIAGNOSIS — I771 Stricture of artery: Secondary | ICD-10-CM | POA: Diagnosis not present

## 2016-12-06 DIAGNOSIS — Z885 Allergy status to narcotic agent status: Secondary | ICD-10-CM | POA: Diagnosis not present

## 2016-12-06 DIAGNOSIS — Z9981 Dependence on supplemental oxygen: Secondary | ICD-10-CM | POA: Diagnosis not present

## 2016-12-06 DIAGNOSIS — Z888 Allergy status to other drugs, medicaments and biological substances status: Secondary | ICD-10-CM | POA: Diagnosis not present

## 2016-12-06 DIAGNOSIS — I10 Essential (primary) hypertension: Secondary | ICD-10-CM | POA: Diagnosis not present

## 2016-12-06 DIAGNOSIS — R0600 Dyspnea, unspecified: Secondary | ICD-10-CM | POA: Diagnosis not present

## 2016-12-06 DIAGNOSIS — Z7982 Long term (current) use of aspirin: Secondary | ICD-10-CM | POA: Diagnosis not present

## 2016-12-06 DIAGNOSIS — Z7409 Other reduced mobility: Secondary | ICD-10-CM | POA: Diagnosis not present

## 2016-12-06 DIAGNOSIS — R918 Other nonspecific abnormal finding of lung field: Secondary | ICD-10-CM | POA: Diagnosis not present

## 2016-12-06 DIAGNOSIS — J984 Other disorders of lung: Secondary | ICD-10-CM | POA: Diagnosis not present

## 2016-12-06 DIAGNOSIS — Z883 Allergy status to other anti-infective agents status: Secondary | ICD-10-CM | POA: Diagnosis not present

## 2016-12-06 DIAGNOSIS — Z87891 Personal history of nicotine dependence: Secondary | ICD-10-CM | POA: Diagnosis not present

## 2016-12-06 DIAGNOSIS — J441 Chronic obstructive pulmonary disease with (acute) exacerbation: Secondary | ICD-10-CM | POA: Diagnosis not present

## 2016-12-06 DIAGNOSIS — Z880 Allergy status to penicillin: Secondary | ICD-10-CM | POA: Diagnosis not present

## 2016-12-06 DIAGNOSIS — Z79899 Other long term (current) drug therapy: Secondary | ICD-10-CM | POA: Diagnosis not present

## 2016-12-06 DIAGNOSIS — R0602 Shortness of breath: Secondary | ICD-10-CM | POA: Diagnosis not present

## 2016-12-06 DIAGNOSIS — Z91012 Allergy to eggs: Secondary | ICD-10-CM | POA: Diagnosis not present

## 2016-12-06 DIAGNOSIS — K219 Gastro-esophageal reflux disease without esophagitis: Secondary | ICD-10-CM | POA: Diagnosis not present

## 2016-12-06 DIAGNOSIS — R451 Restlessness and agitation: Secondary | ICD-10-CM | POA: Diagnosis not present

## 2016-12-07 DIAGNOSIS — Z880 Allergy status to penicillin: Secondary | ICD-10-CM | POA: Diagnosis not present

## 2016-12-07 DIAGNOSIS — Z791 Long term (current) use of non-steroidal anti-inflammatories (NSAID): Secondary | ICD-10-CM | POA: Diagnosis not present

## 2016-12-07 DIAGNOSIS — I10 Essential (primary) hypertension: Secondary | ICD-10-CM | POA: Diagnosis not present

## 2016-12-07 DIAGNOSIS — Z9981 Dependence on supplemental oxygen: Secondary | ICD-10-CM | POA: Diagnosis not present

## 2016-12-07 DIAGNOSIS — Z888 Allergy status to other drugs, medicaments and biological substances status: Secondary | ICD-10-CM | POA: Diagnosis not present

## 2016-12-07 DIAGNOSIS — Z91012 Allergy to eggs: Secondary | ICD-10-CM | POA: Diagnosis not present

## 2016-12-07 DIAGNOSIS — Z885 Allergy status to narcotic agent status: Secondary | ICD-10-CM | POA: Diagnosis not present

## 2016-12-07 DIAGNOSIS — Z87891 Personal history of nicotine dependence: Secondary | ICD-10-CM | POA: Diagnosis not present

## 2016-12-07 DIAGNOSIS — Z79899 Other long term (current) drug therapy: Secondary | ICD-10-CM | POA: Diagnosis not present

## 2016-12-07 DIAGNOSIS — N390 Urinary tract infection, site not specified: Secondary | ICD-10-CM | POA: Diagnosis not present

## 2016-12-07 DIAGNOSIS — Z881 Allergy status to other antibiotic agents status: Secondary | ICD-10-CM | POA: Diagnosis not present

## 2016-12-07 DIAGNOSIS — J449 Chronic obstructive pulmonary disease, unspecified: Secondary | ICD-10-CM | POA: Diagnosis not present

## 2016-12-07 DIAGNOSIS — M1990 Unspecified osteoarthritis, unspecified site: Secondary | ICD-10-CM | POA: Diagnosis not present

## 2016-12-08 ENCOUNTER — Other Ambulatory Visit: Payer: Self-pay

## 2016-12-08 NOTE — Patient Outreach (Signed)
[facility]) Care Management  12/08/2016  Alliene B Raphael Jan 21, 1935 [id number]   No response from patient after 3 telephone calls and outreach letter attempt.   PLAN:  RNCM will refer patient to care management assistant to close due to being unable to contact. RNCM will notify patients primary MD of closure.   Quinn Plowman RN,BSN,CCM [facility] Telephonic  [phone]

## 2016-12-09 DIAGNOSIS — N390 Urinary tract infection, site not specified: Secondary | ICD-10-CM | POA: Diagnosis not present

## 2016-12-09 DIAGNOSIS — J449 Chronic obstructive pulmonary disease, unspecified: Secondary | ICD-10-CM | POA: Diagnosis not present

## 2016-12-14 DIAGNOSIS — E876 Hypokalemia: Secondary | ICD-10-CM | POA: Diagnosis not present

## 2016-12-14 DIAGNOSIS — I10 Essential (primary) hypertension: Secondary | ICD-10-CM | POA: Diagnosis not present

## 2016-12-25 DIAGNOSIS — J449 Chronic obstructive pulmonary disease, unspecified: Secondary | ICD-10-CM | POA: Diagnosis not present

## 2016-12-29 DIAGNOSIS — L600 Ingrowing nail: Secondary | ICD-10-CM | POA: Diagnosis not present

## 2016-12-29 DIAGNOSIS — B351 Tinea unguium: Secondary | ICD-10-CM | POA: Diagnosis not present

## 2016-12-29 DIAGNOSIS — R600 Localized edema: Secondary | ICD-10-CM | POA: Diagnosis not present

## 2016-12-29 DIAGNOSIS — Q845 Enlarged and hypertrophic nails: Secondary | ICD-10-CM | POA: Diagnosis not present

## 2016-12-29 DIAGNOSIS — L603 Nail dystrophy: Secondary | ICD-10-CM | POA: Diagnosis not present

## 2016-12-30 DIAGNOSIS — M1990 Unspecified osteoarthritis, unspecified site: Secondary | ICD-10-CM | POA: Diagnosis not present

## 2016-12-30 DIAGNOSIS — G894 Chronic pain syndrome: Secondary | ICD-10-CM | POA: Diagnosis not present

## 2017-01-06 DIAGNOSIS — E559 Vitamin D deficiency, unspecified: Secondary | ICD-10-CM | POA: Diagnosis not present

## 2017-01-06 DIAGNOSIS — S91302S Unspecified open wound, left foot, sequela: Secondary | ICD-10-CM | POA: Diagnosis not present

## 2017-01-06 DIAGNOSIS — R2689 Other abnormalities of gait and mobility: Secondary | ICD-10-CM | POA: Diagnosis not present

## 2017-01-11 DIAGNOSIS — Z79899 Other long term (current) drug therapy: Secondary | ICD-10-CM | POA: Diagnosis not present

## 2017-01-18 DIAGNOSIS — Z79899 Other long term (current) drug therapy: Secondary | ICD-10-CM | POA: Diagnosis not present

## 2017-01-25 DIAGNOSIS — J449 Chronic obstructive pulmonary disease, unspecified: Secondary | ICD-10-CM | POA: Diagnosis not present

## 2017-01-27 DIAGNOSIS — G894 Chronic pain syndrome: Secondary | ICD-10-CM | POA: Diagnosis not present

## 2017-01-27 DIAGNOSIS — M1990 Unspecified osteoarthritis, unspecified site: Secondary | ICD-10-CM | POA: Diagnosis not present

## 2017-02-03 DIAGNOSIS — G4700 Insomnia, unspecified: Secondary | ICD-10-CM | POA: Diagnosis not present

## 2017-02-03 DIAGNOSIS — R2689 Other abnormalities of gait and mobility: Secondary | ICD-10-CM | POA: Diagnosis not present

## 2017-02-03 DIAGNOSIS — I10 Essential (primary) hypertension: Secondary | ICD-10-CM | POA: Diagnosis not present

## 2017-02-03 DIAGNOSIS — E559 Vitamin D deficiency, unspecified: Secondary | ICD-10-CM | POA: Diagnosis not present

## 2017-02-03 DIAGNOSIS — K219 Gastro-esophageal reflux disease without esophagitis: Secondary | ICD-10-CM | POA: Diagnosis not present

## 2017-02-04 DIAGNOSIS — Z9981 Dependence on supplemental oxygen: Secondary | ICD-10-CM | POA: Diagnosis not present

## 2017-02-04 DIAGNOSIS — K219 Gastro-esophageal reflux disease without esophagitis: Secondary | ICD-10-CM | POA: Diagnosis not present

## 2017-02-04 DIAGNOSIS — M19011 Primary osteoarthritis, right shoulder: Secondary | ICD-10-CM | POA: Diagnosis not present

## 2017-02-04 DIAGNOSIS — S0990XA Unspecified injury of head, initial encounter: Secondary | ICD-10-CM | POA: Diagnosis not present

## 2017-02-04 DIAGNOSIS — J449 Chronic obstructive pulmonary disease, unspecified: Secondary | ICD-10-CM | POA: Diagnosis not present

## 2017-02-04 DIAGNOSIS — Z79899 Other long term (current) drug therapy: Secondary | ICD-10-CM | POA: Diagnosis not present

## 2017-02-04 DIAGNOSIS — Z888 Allergy status to other drugs, medicaments and biological substances status: Secondary | ICD-10-CM | POA: Diagnosis not present

## 2017-02-04 DIAGNOSIS — S0181XA Laceration without foreign body of other part of head, initial encounter: Secondary | ICD-10-CM | POA: Diagnosis not present

## 2017-02-04 DIAGNOSIS — R069 Unspecified abnormalities of breathing: Secondary | ICD-10-CM | POA: Diagnosis not present

## 2017-02-04 DIAGNOSIS — W19XXXA Unspecified fall, initial encounter: Secondary | ICD-10-CM | POA: Diagnosis not present

## 2017-02-04 DIAGNOSIS — Z885 Allergy status to narcotic agent status: Secondary | ICD-10-CM | POA: Diagnosis not present

## 2017-02-04 DIAGNOSIS — S42401A Unspecified fracture of lower end of right humerus, initial encounter for closed fracture: Secondary | ICD-10-CM | POA: Diagnosis not present

## 2017-02-04 DIAGNOSIS — S42441A Displaced fracture (avulsion) of medial epicondyle of right humerus, initial encounter for closed fracture: Secondary | ICD-10-CM | POA: Diagnosis not present

## 2017-02-04 DIAGNOSIS — Z91012 Allergy to eggs: Secondary | ICD-10-CM | POA: Diagnosis not present

## 2017-02-04 DIAGNOSIS — M1990 Unspecified osteoarthritis, unspecified site: Secondary | ICD-10-CM | POA: Diagnosis not present

## 2017-02-04 DIAGNOSIS — J189 Pneumonia, unspecified organism: Secondary | ICD-10-CM | POA: Diagnosis not present

## 2017-02-04 DIAGNOSIS — Z87891 Personal history of nicotine dependence: Secondary | ICD-10-CM | POA: Diagnosis not present

## 2017-02-04 DIAGNOSIS — M25511 Pain in right shoulder: Secondary | ICD-10-CM | POA: Diagnosis not present

## 2017-02-04 DIAGNOSIS — I6782 Cerebral ischemia: Secondary | ICD-10-CM | POA: Diagnosis not present

## 2017-02-04 DIAGNOSIS — Z880 Allergy status to penicillin: Secondary | ICD-10-CM | POA: Diagnosis not present

## 2017-02-04 DIAGNOSIS — I10 Essential (primary) hypertension: Secondary | ICD-10-CM | POA: Diagnosis not present

## 2017-02-04 DIAGNOSIS — M7989 Other specified soft tissue disorders: Secondary | ICD-10-CM | POA: Diagnosis not present

## 2017-02-04 DIAGNOSIS — R079 Chest pain, unspecified: Secondary | ICD-10-CM | POA: Diagnosis not present

## 2017-02-04 DIAGNOSIS — R918 Other nonspecific abnormal finding of lung field: Secondary | ICD-10-CM | POA: Diagnosis not present

## 2017-02-04 DIAGNOSIS — G9389 Other specified disorders of brain: Secondary | ICD-10-CM | POA: Diagnosis not present

## 2017-02-04 DIAGNOSIS — S59901A Unspecified injury of right elbow, initial encounter: Secondary | ICD-10-CM | POA: Diagnosis not present

## 2017-02-04 DIAGNOSIS — J984 Other disorders of lung: Secondary | ICD-10-CM | POA: Diagnosis not present

## 2017-02-05 DIAGNOSIS — R269 Unspecified abnormalities of gait and mobility: Secondary | ICD-10-CM | POA: Diagnosis not present

## 2017-02-05 DIAGNOSIS — S0191XA Laceration without foreign body of unspecified part of head, initial encounter: Secondary | ICD-10-CM | POA: Diagnosis not present

## 2017-02-07 DIAGNOSIS — Z91012 Allergy to eggs: Secondary | ICD-10-CM | POA: Diagnosis not present

## 2017-02-07 DIAGNOSIS — I6782 Cerebral ischemia: Secondary | ICD-10-CM | POA: Diagnosis not present

## 2017-02-07 DIAGNOSIS — W19XXXA Unspecified fall, initial encounter: Secondary | ICD-10-CM | POA: Diagnosis not present

## 2017-02-07 DIAGNOSIS — Z880 Allergy status to penicillin: Secondary | ICD-10-CM | POA: Diagnosis not present

## 2017-02-07 DIAGNOSIS — I10 Essential (primary) hypertension: Secondary | ICD-10-CM | POA: Diagnosis not present

## 2017-02-07 DIAGNOSIS — S0181XA Laceration without foreign body of other part of head, initial encounter: Secondary | ICD-10-CM | POA: Diagnosis not present

## 2017-02-07 DIAGNOSIS — Z79899 Other long term (current) drug therapy: Secondary | ICD-10-CM | POA: Diagnosis not present

## 2017-02-07 DIAGNOSIS — M47892 Other spondylosis, cervical region: Secondary | ICD-10-CM | POA: Diagnosis not present

## 2017-02-07 DIAGNOSIS — S0990XA Unspecified injury of head, initial encounter: Secondary | ICD-10-CM | POA: Diagnosis not present

## 2017-02-07 DIAGNOSIS — Z043 Encounter for examination and observation following other accident: Secondary | ICD-10-CM | POA: Diagnosis not present

## 2017-02-07 DIAGNOSIS — Z888 Allergy status to other drugs, medicaments and biological substances status: Secondary | ICD-10-CM | POA: Diagnosis not present

## 2017-02-07 DIAGNOSIS — Z7409 Other reduced mobility: Secondary | ICD-10-CM | POA: Diagnosis not present

## 2017-02-07 DIAGNOSIS — M50321 Other cervical disc degeneration at C4-C5 level: Secondary | ICD-10-CM | POA: Diagnosis not present

## 2017-02-07 DIAGNOSIS — K219 Gastro-esophageal reflux disease without esophagitis: Secondary | ICD-10-CM | POA: Diagnosis not present

## 2017-02-07 DIAGNOSIS — R51 Headache: Secondary | ICD-10-CM | POA: Diagnosis not present

## 2017-02-07 DIAGNOSIS — R05 Cough: Secondary | ICD-10-CM | POA: Diagnosis not present

## 2017-02-07 DIAGNOSIS — Z882 Allergy status to sulfonamides status: Secondary | ICD-10-CM | POA: Diagnosis not present

## 2017-02-07 DIAGNOSIS — R229 Localized swelling, mass and lump, unspecified: Secondary | ICD-10-CM | POA: Diagnosis not present

## 2017-02-07 DIAGNOSIS — I672 Cerebral atherosclerosis: Secondary | ICD-10-CM | POA: Diagnosis not present

## 2017-02-07 DIAGNOSIS — Z87891 Personal history of nicotine dependence: Secondary | ICD-10-CM | POA: Diagnosis not present

## 2017-02-07 DIAGNOSIS — Z885 Allergy status to narcotic agent status: Secondary | ICD-10-CM | POA: Diagnosis not present

## 2017-02-07 DIAGNOSIS — Z9981 Dependence on supplemental oxygen: Secondary | ICD-10-CM | POA: Diagnosis not present

## 2017-02-07 DIAGNOSIS — J449 Chronic obstructive pulmonary disease, unspecified: Secondary | ICD-10-CM | POA: Diagnosis not present

## 2017-02-17 DIAGNOSIS — R296 Repeated falls: Secondary | ICD-10-CM | POA: Diagnosis not present

## 2017-02-17 DIAGNOSIS — M1990 Unspecified osteoarthritis, unspecified site: Secondary | ICD-10-CM | POA: Diagnosis not present

## 2017-02-17 DIAGNOSIS — J189 Pneumonia, unspecified organism: Secondary | ICD-10-CM | POA: Diagnosis not present

## 2017-02-17 DIAGNOSIS — M9741XA Periprosthetic fracture around internal prosthetic right elbow joint, initial encounter: Secondary | ICD-10-CM | POA: Diagnosis not present

## 2017-02-17 DIAGNOSIS — G894 Chronic pain syndrome: Secondary | ICD-10-CM | POA: Diagnosis not present

## 2017-02-20 DIAGNOSIS — I10 Essential (primary) hypertension: Secondary | ICD-10-CM | POA: Diagnosis not present

## 2017-02-20 DIAGNOSIS — J449 Chronic obstructive pulmonary disease, unspecified: Secondary | ICD-10-CM | POA: Diagnosis not present

## 2017-02-24 DIAGNOSIS — J449 Chronic obstructive pulmonary disease, unspecified: Secondary | ICD-10-CM | POA: Diagnosis not present

## 2017-03-03 DIAGNOSIS — K219 Gastro-esophageal reflux disease without esophagitis: Secondary | ICD-10-CM | POA: Diagnosis not present

## 2017-03-03 DIAGNOSIS — I10 Essential (primary) hypertension: Secondary | ICD-10-CM | POA: Diagnosis not present

## 2017-03-03 DIAGNOSIS — R296 Repeated falls: Secondary | ICD-10-CM | POA: Diagnosis not present

## 2017-03-03 DIAGNOSIS — L89311 Pressure ulcer of right buttock, stage 1: Secondary | ICD-10-CM | POA: Diagnosis not present

## 2017-03-03 DIAGNOSIS — E876 Hypokalemia: Secondary | ICD-10-CM | POA: Diagnosis not present

## 2017-03-04 DIAGNOSIS — J449 Chronic obstructive pulmonary disease, unspecified: Secondary | ICD-10-CM | POA: Diagnosis not present

## 2017-03-04 DIAGNOSIS — Z885 Allergy status to narcotic agent status: Secondary | ICD-10-CM | POA: Diagnosis not present

## 2017-03-04 DIAGNOSIS — K219 Gastro-esophageal reflux disease without esophagitis: Secondary | ICD-10-CM | POA: Diagnosis not present

## 2017-03-04 DIAGNOSIS — Z888 Allergy status to other drugs, medicaments and biological substances status: Secondary | ICD-10-CM | POA: Diagnosis not present

## 2017-03-04 DIAGNOSIS — I6789 Other cerebrovascular disease: Secondary | ICD-10-CM | POA: Diagnosis not present

## 2017-03-04 DIAGNOSIS — Z880 Allergy status to penicillin: Secondary | ICD-10-CM | POA: Diagnosis not present

## 2017-03-04 DIAGNOSIS — Z79899 Other long term (current) drug therapy: Secondary | ICD-10-CM | POA: Diagnosis not present

## 2017-03-04 DIAGNOSIS — R4781 Slurred speech: Secondary | ICD-10-CM | POA: Diagnosis not present

## 2017-03-04 DIAGNOSIS — E860 Dehydration: Secondary | ICD-10-CM | POA: Diagnosis not present

## 2017-03-04 DIAGNOSIS — Z91012 Allergy to eggs: Secondary | ICD-10-CM | POA: Diagnosis not present

## 2017-03-04 DIAGNOSIS — M19019 Primary osteoarthritis, unspecified shoulder: Secondary | ICD-10-CM | POA: Diagnosis not present

## 2017-03-04 DIAGNOSIS — Z7951 Long term (current) use of inhaled steroids: Secondary | ICD-10-CM | POA: Diagnosis not present

## 2017-03-04 DIAGNOSIS — Z7401 Bed confinement status: Secondary | ICD-10-CM | POA: Diagnosis not present

## 2017-03-04 DIAGNOSIS — Z87891 Personal history of nicotine dependence: Secondary | ICD-10-CM | POA: Diagnosis not present

## 2017-03-04 DIAGNOSIS — Z9981 Dependence on supplemental oxygen: Secondary | ICD-10-CM | POA: Diagnosis not present

## 2017-03-07 DIAGNOSIS — I672 Cerebral atherosclerosis: Secondary | ICD-10-CM | POA: Diagnosis not present

## 2017-03-07 DIAGNOSIS — Z888 Allergy status to other drugs, medicaments and biological substances status: Secondary | ICD-10-CM | POA: Diagnosis not present

## 2017-03-07 DIAGNOSIS — M25561 Pain in right knee: Secondary | ICD-10-CM | POA: Diagnosis not present

## 2017-03-07 DIAGNOSIS — R918 Other nonspecific abnormal finding of lung field: Secondary | ICD-10-CM | POA: Diagnosis not present

## 2017-03-07 DIAGNOSIS — I10 Essential (primary) hypertension: Secondary | ICD-10-CM | POA: Diagnosis not present

## 2017-03-07 DIAGNOSIS — Z79899 Other long term (current) drug therapy: Secondary | ICD-10-CM | POA: Diagnosis not present

## 2017-03-07 DIAGNOSIS — D649 Anemia, unspecified: Secondary | ICD-10-CM | POA: Diagnosis not present

## 2017-03-07 DIAGNOSIS — N390 Urinary tract infection, site not specified: Secondary | ICD-10-CM | POA: Diagnosis not present

## 2017-03-07 DIAGNOSIS — Z880 Allergy status to penicillin: Secondary | ICD-10-CM | POA: Diagnosis not present

## 2017-03-07 DIAGNOSIS — R319 Hematuria, unspecified: Secondary | ICD-10-CM | POA: Diagnosis not present

## 2017-03-07 DIAGNOSIS — R9082 White matter disease, unspecified: Secondary | ICD-10-CM | POA: Diagnosis not present

## 2017-03-07 DIAGNOSIS — Z881 Allergy status to other antibiotic agents status: Secondary | ICD-10-CM | POA: Diagnosis not present

## 2017-03-07 DIAGNOSIS — K219 Gastro-esophageal reflux disease without esophagitis: Secondary | ICD-10-CM | POA: Diagnosis not present

## 2017-03-07 DIAGNOSIS — R296 Repeated falls: Secondary | ICD-10-CM | POA: Diagnosis not present

## 2017-03-07 DIAGNOSIS — Z7409 Other reduced mobility: Secondary | ICD-10-CM | POA: Diagnosis not present

## 2017-03-07 DIAGNOSIS — Z87891 Personal history of nicotine dependence: Secondary | ICD-10-CM | POA: Diagnosis not present

## 2017-03-07 DIAGNOSIS — M85861 Other specified disorders of bone density and structure, right lower leg: Secondary | ICD-10-CM | POA: Diagnosis not present

## 2017-03-07 DIAGNOSIS — Z885 Allergy status to narcotic agent status: Secondary | ICD-10-CM | POA: Diagnosis not present

## 2017-03-07 DIAGNOSIS — S0083XA Contusion of other part of head, initial encounter: Secondary | ICD-10-CM | POA: Diagnosis not present

## 2017-03-07 DIAGNOSIS — R102 Pelvic and perineal pain: Secondary | ICD-10-CM | POA: Diagnosis not present

## 2017-03-07 DIAGNOSIS — J449 Chronic obstructive pulmonary disease, unspecified: Secondary | ICD-10-CM | POA: Diagnosis not present

## 2017-03-07 DIAGNOSIS — M1711 Unilateral primary osteoarthritis, right knee: Secondary | ICD-10-CM | POA: Diagnosis not present

## 2017-03-07 DIAGNOSIS — M1612 Unilateral primary osteoarthritis, left hip: Secondary | ICD-10-CM | POA: Diagnosis not present

## 2017-03-10 DIAGNOSIS — R05 Cough: Secondary | ICD-10-CM | POA: Diagnosis not present

## 2017-03-17 DIAGNOSIS — M1990 Unspecified osteoarthritis, unspecified site: Secondary | ICD-10-CM | POA: Diagnosis not present

## 2017-03-17 DIAGNOSIS — G894 Chronic pain syndrome: Secondary | ICD-10-CM | POA: Diagnosis not present

## 2017-03-17 DIAGNOSIS — N390 Urinary tract infection, site not specified: Secondary | ICD-10-CM | POA: Diagnosis not present

## 2017-03-17 DIAGNOSIS — M9741XA Periprosthetic fracture around internal prosthetic right elbow joint, initial encounter: Secondary | ICD-10-CM | POA: Diagnosis not present

## 2017-03-21 DIAGNOSIS — R229 Localized swelling, mass and lump, unspecified: Secondary | ICD-10-CM | POA: Diagnosis not present

## 2017-03-21 DIAGNOSIS — K219 Gastro-esophageal reflux disease without esophagitis: Secondary | ICD-10-CM | POA: Diagnosis not present

## 2017-03-21 DIAGNOSIS — Z883 Allergy status to other anti-infective agents status: Secondary | ICD-10-CM | POA: Diagnosis not present

## 2017-03-21 DIAGNOSIS — M2010 Hallux valgus (acquired), unspecified foot: Secondary | ICD-10-CM | POA: Diagnosis not present

## 2017-03-21 DIAGNOSIS — Z880 Allergy status to penicillin: Secondary | ICD-10-CM | POA: Diagnosis not present

## 2017-03-21 DIAGNOSIS — B351 Tinea unguium: Secondary | ICD-10-CM | POA: Diagnosis not present

## 2017-03-21 DIAGNOSIS — Z888 Allergy status to other drugs, medicaments and biological substances status: Secondary | ICD-10-CM | POA: Diagnosis not present

## 2017-03-21 DIAGNOSIS — Z885 Allergy status to narcotic agent status: Secondary | ICD-10-CM | POA: Diagnosis not present

## 2017-03-21 DIAGNOSIS — W19XXXA Unspecified fall, initial encounter: Secondary | ICD-10-CM | POA: Diagnosis not present

## 2017-03-21 DIAGNOSIS — R269 Unspecified abnormalities of gait and mobility: Secondary | ICD-10-CM | POA: Diagnosis not present

## 2017-03-21 DIAGNOSIS — I10 Essential (primary) hypertension: Secondary | ICD-10-CM | POA: Diagnosis not present

## 2017-03-21 DIAGNOSIS — G319 Degenerative disease of nervous system, unspecified: Secondary | ICD-10-CM | POA: Diagnosis not present

## 2017-03-21 DIAGNOSIS — Z79899 Other long term (current) drug therapy: Secondary | ICD-10-CM | POA: Diagnosis not present

## 2017-03-21 DIAGNOSIS — S0990XA Unspecified injury of head, initial encounter: Secondary | ICD-10-CM | POA: Diagnosis not present

## 2017-03-21 DIAGNOSIS — R220 Localized swelling, mass and lump, head: Secondary | ICD-10-CM | POA: Diagnosis not present

## 2017-03-21 DIAGNOSIS — L603 Nail dystrophy: Secondary | ICD-10-CM | POA: Diagnosis not present

## 2017-03-21 DIAGNOSIS — Q845 Enlarged and hypertrophic nails: Secondary | ICD-10-CM | POA: Diagnosis not present

## 2017-03-21 DIAGNOSIS — Z91012 Allergy to eggs: Secondary | ICD-10-CM | POA: Diagnosis not present

## 2017-03-21 DIAGNOSIS — J449 Chronic obstructive pulmonary disease, unspecified: Secondary | ICD-10-CM | POA: Diagnosis not present

## 2017-03-21 DIAGNOSIS — Z87891 Personal history of nicotine dependence: Secondary | ICD-10-CM | POA: Diagnosis not present

## 2017-03-22 DIAGNOSIS — Z79899 Other long term (current) drug therapy: Secondary | ICD-10-CM | POA: Diagnosis not present

## 2017-03-23 DIAGNOSIS — Z888 Allergy status to other drugs, medicaments and biological substances status: Secondary | ICD-10-CM | POA: Diagnosis not present

## 2017-03-23 DIAGNOSIS — T17920A Food in respiratory tract, part unspecified causing asphyxiation, initial encounter: Secondary | ICD-10-CM | POA: Diagnosis not present

## 2017-03-23 DIAGNOSIS — J449 Chronic obstructive pulmonary disease, unspecified: Secondary | ICD-10-CM | POA: Diagnosis not present

## 2017-03-23 DIAGNOSIS — R0989 Other specified symptoms and signs involving the circulatory and respiratory systems: Secondary | ICD-10-CM | POA: Diagnosis not present

## 2017-03-23 DIAGNOSIS — R0901 Asphyxia: Secondary | ICD-10-CM | POA: Diagnosis not present

## 2017-03-23 DIAGNOSIS — Z9981 Dependence on supplemental oxygen: Secondary | ICD-10-CM | POA: Diagnosis not present

## 2017-03-23 DIAGNOSIS — Z885 Allergy status to narcotic agent status: Secondary | ICD-10-CM | POA: Diagnosis not present

## 2017-03-23 DIAGNOSIS — T17928A Food in respiratory tract, part unspecified causing other injury, initial encounter: Secondary | ICD-10-CM | POA: Diagnosis not present

## 2017-03-23 DIAGNOSIS — Z79899 Other long term (current) drug therapy: Secondary | ICD-10-CM | POA: Diagnosis not present

## 2017-03-23 DIAGNOSIS — Z883 Allergy status to other anti-infective agents status: Secondary | ICD-10-CM | POA: Diagnosis not present

## 2017-03-23 DIAGNOSIS — Z87891 Personal history of nicotine dependence: Secondary | ICD-10-CM | POA: Diagnosis not present

## 2017-03-23 DIAGNOSIS — M1990 Unspecified osteoarthritis, unspecified site: Secondary | ICD-10-CM | POA: Diagnosis not present

## 2017-03-23 DIAGNOSIS — K219 Gastro-esophageal reflux disease without esophagitis: Secondary | ICD-10-CM | POA: Diagnosis not present

## 2017-03-24 DIAGNOSIS — M1990 Unspecified osteoarthritis, unspecified site: Secondary | ICD-10-CM | POA: Diagnosis not present

## 2017-03-24 DIAGNOSIS — R296 Repeated falls: Secondary | ICD-10-CM | POA: Diagnosis not present

## 2017-03-24 DIAGNOSIS — R2689 Other abnormalities of gait and mobility: Secondary | ICD-10-CM | POA: Diagnosis not present

## 2017-03-26 DIAGNOSIS — M47892 Other spondylosis, cervical region: Secondary | ICD-10-CM | POA: Diagnosis not present

## 2017-03-26 DIAGNOSIS — Z043 Encounter for examination and observation following other accident: Secondary | ICD-10-CM | POA: Diagnosis not present

## 2017-03-26 DIAGNOSIS — W19XXXA Unspecified fall, initial encounter: Secondary | ICD-10-CM | POA: Diagnosis not present

## 2017-03-26 DIAGNOSIS — Z885 Allergy status to narcotic agent status: Secondary | ICD-10-CM | POA: Diagnosis not present

## 2017-03-26 DIAGNOSIS — M1990 Unspecified osteoarthritis, unspecified site: Secondary | ICD-10-CM | POA: Diagnosis not present

## 2017-03-26 DIAGNOSIS — K219 Gastro-esophageal reflux disease without esophagitis: Secondary | ICD-10-CM | POA: Diagnosis not present

## 2017-03-26 DIAGNOSIS — Z79899 Other long term (current) drug therapy: Secondary | ICD-10-CM | POA: Diagnosis not present

## 2017-03-26 DIAGNOSIS — S199XXA Unspecified injury of neck, initial encounter: Secondary | ICD-10-CM | POA: Diagnosis not present

## 2017-03-26 DIAGNOSIS — R269 Unspecified abnormalities of gait and mobility: Secondary | ICD-10-CM | POA: Diagnosis not present

## 2017-03-26 DIAGNOSIS — Z91012 Allergy to eggs: Secondary | ICD-10-CM | POA: Diagnosis not present

## 2017-03-26 DIAGNOSIS — I10 Essential (primary) hypertension: Secondary | ICD-10-CM | POA: Diagnosis not present

## 2017-03-26 DIAGNOSIS — G9389 Other specified disorders of brain: Secondary | ICD-10-CM | POA: Diagnosis not present

## 2017-03-26 DIAGNOSIS — R918 Other nonspecific abnormal finding of lung field: Secondary | ICD-10-CM | POA: Diagnosis not present

## 2017-03-26 DIAGNOSIS — Z883 Allergy status to other anti-infective agents status: Secondary | ICD-10-CM | POA: Diagnosis not present

## 2017-03-26 DIAGNOSIS — Z9981 Dependence on supplemental oxygen: Secondary | ICD-10-CM | POA: Diagnosis not present

## 2017-03-26 DIAGNOSIS — Z87891 Personal history of nicotine dependence: Secondary | ICD-10-CM | POA: Diagnosis not present

## 2017-03-26 DIAGNOSIS — J449 Chronic obstructive pulmonary disease, unspecified: Secondary | ICD-10-CM | POA: Diagnosis not present

## 2017-03-26 DIAGNOSIS — R410 Disorientation, unspecified: Secondary | ICD-10-CM | POA: Diagnosis not present

## 2017-03-26 DIAGNOSIS — R402411 Glasgow coma scale score 13-15, in the field [EMT or ambulance]: Secondary | ICD-10-CM | POA: Diagnosis not present

## 2017-03-26 DIAGNOSIS — Z9181 History of falling: Secondary | ICD-10-CM | POA: Diagnosis not present

## 2017-03-26 DIAGNOSIS — Z880 Allergy status to penicillin: Secondary | ICD-10-CM | POA: Diagnosis not present

## 2017-03-26 DIAGNOSIS — S0990XA Unspecified injury of head, initial encounter: Secondary | ICD-10-CM | POA: Diagnosis not present

## 2017-03-26 DIAGNOSIS — R9082 White matter disease, unspecified: Secondary | ICD-10-CM | POA: Diagnosis not present

## 2017-03-26 DIAGNOSIS — R0602 Shortness of breath: Secondary | ICD-10-CM | POA: Diagnosis not present

## 2017-03-26 DIAGNOSIS — Z888 Allergy status to other drugs, medicaments and biological substances status: Secondary | ICD-10-CM | POA: Diagnosis not present

## 2017-03-27 DIAGNOSIS — R269 Unspecified abnormalities of gait and mobility: Secondary | ICD-10-CM | POA: Diagnosis not present

## 2017-03-27 DIAGNOSIS — Z9181 History of falling: Secondary | ICD-10-CM | POA: Diagnosis not present

## 2017-03-27 DIAGNOSIS — J441 Chronic obstructive pulmonary disease with (acute) exacerbation: Secondary | ICD-10-CM | POA: Diagnosis not present

## 2017-03-27 DIAGNOSIS — R918 Other nonspecific abnormal finding of lung field: Secondary | ICD-10-CM | POA: Diagnosis not present

## 2017-03-27 DIAGNOSIS — R410 Disorientation, unspecified: Secondary | ICD-10-CM | POA: Diagnosis not present

## 2017-03-27 DIAGNOSIS — K219 Gastro-esophageal reflux disease without esophagitis: Secondary | ICD-10-CM | POA: Diagnosis not present

## 2017-03-27 DIAGNOSIS — R0602 Shortness of breath: Secondary | ICD-10-CM | POA: Diagnosis not present

## 2017-03-27 DIAGNOSIS — R401 Stupor: Secondary | ICD-10-CM | POA: Diagnosis not present

## 2017-03-27 DIAGNOSIS — R062 Wheezing: Secondary | ICD-10-CM | POA: Diagnosis not present

## 2017-03-27 DIAGNOSIS — S0003XA Contusion of scalp, initial encounter: Secondary | ICD-10-CM | POA: Diagnosis not present

## 2017-03-27 DIAGNOSIS — I10 Essential (primary) hypertension: Secondary | ICD-10-CM | POA: Diagnosis not present

## 2017-03-27 DIAGNOSIS — J449 Chronic obstructive pulmonary disease, unspecified: Secondary | ICD-10-CM | POA: Diagnosis not present

## 2017-03-27 DIAGNOSIS — W19XXXA Unspecified fall, initial encounter: Secondary | ICD-10-CM | POA: Diagnosis not present

## 2017-03-27 DIAGNOSIS — R079 Chest pain, unspecified: Secondary | ICD-10-CM | POA: Diagnosis not present

## 2017-03-27 DIAGNOSIS — M1990 Unspecified osteoarthritis, unspecified site: Secondary | ICD-10-CM | POA: Diagnosis not present

## 2017-03-27 DIAGNOSIS — Z9981 Dependence on supplemental oxygen: Secondary | ICD-10-CM | POA: Diagnosis not present

## 2017-03-30 DIAGNOSIS — Q845 Enlarged and hypertrophic nails: Secondary | ICD-10-CM | POA: Diagnosis not present

## 2017-03-30 DIAGNOSIS — I739 Peripheral vascular disease, unspecified: Secondary | ICD-10-CM | POA: Diagnosis not present

## 2017-03-30 DIAGNOSIS — M2010 Hallux valgus (acquired), unspecified foot: Secondary | ICD-10-CM | POA: Diagnosis not present

## 2017-03-30 DIAGNOSIS — B351 Tinea unguium: Secondary | ICD-10-CM | POA: Diagnosis not present

## 2017-03-30 DIAGNOSIS — L603 Nail dystrophy: Secondary | ICD-10-CM | POA: Diagnosis not present

## 2017-03-31 DIAGNOSIS — L89311 Pressure ulcer of right buttock, stage 1: Secondary | ICD-10-CM | POA: Diagnosis not present

## 2017-03-31 DIAGNOSIS — E559 Vitamin D deficiency, unspecified: Secondary | ICD-10-CM | POA: Diagnosis not present

## 2017-03-31 DIAGNOSIS — J449 Chronic obstructive pulmonary disease, unspecified: Secondary | ICD-10-CM | POA: Diagnosis not present

## 2017-03-31 DIAGNOSIS — R296 Repeated falls: Secondary | ICD-10-CM | POA: Diagnosis not present

## 2017-03-31 DIAGNOSIS — R2689 Other abnormalities of gait and mobility: Secondary | ICD-10-CM | POA: Diagnosis not present

## 2017-04-17 DIAGNOSIS — K219 Gastro-esophageal reflux disease without esophagitis: Secondary | ICD-10-CM | POA: Diagnosis not present

## 2017-04-17 DIAGNOSIS — R269 Unspecified abnormalities of gait and mobility: Secondary | ICD-10-CM | POA: Diagnosis not present

## 2017-04-17 DIAGNOSIS — Z91012 Allergy to eggs: Secondary | ICD-10-CM | POA: Diagnosis not present

## 2017-04-17 DIAGNOSIS — S0181XA Laceration without foreign body of other part of head, initial encounter: Secondary | ICD-10-CM | POA: Diagnosis not present

## 2017-04-17 DIAGNOSIS — Z9981 Dependence on supplemental oxygen: Secondary | ICD-10-CM | POA: Diagnosis not present

## 2017-04-17 DIAGNOSIS — I10 Essential (primary) hypertension: Secondary | ICD-10-CM | POA: Diagnosis not present

## 2017-04-17 DIAGNOSIS — Z888 Allergy status to other drugs, medicaments and biological substances status: Secondary | ICD-10-CM | POA: Diagnosis not present

## 2017-04-17 DIAGNOSIS — W19XXXA Unspecified fall, initial encounter: Secondary | ICD-10-CM | POA: Diagnosis not present

## 2017-04-17 DIAGNOSIS — Z79899 Other long term (current) drug therapy: Secondary | ICD-10-CM | POA: Diagnosis not present

## 2017-04-17 DIAGNOSIS — Z885 Allergy status to narcotic agent status: Secondary | ICD-10-CM | POA: Diagnosis not present

## 2017-04-17 DIAGNOSIS — M1990 Unspecified osteoarthritis, unspecified site: Secondary | ICD-10-CM | POA: Diagnosis not present

## 2017-04-17 DIAGNOSIS — Z880 Allergy status to penicillin: Secondary | ICD-10-CM | POA: Diagnosis not present

## 2017-04-17 DIAGNOSIS — S0101XA Laceration without foreign body of scalp, initial encounter: Secondary | ICD-10-CM | POA: Diagnosis not present

## 2017-04-17 DIAGNOSIS — R51 Headache: Secondary | ICD-10-CM | POA: Diagnosis not present

## 2017-04-17 DIAGNOSIS — S0990XA Unspecified injury of head, initial encounter: Secondary | ICD-10-CM | POA: Diagnosis not present

## 2017-04-17 DIAGNOSIS — G9389 Other specified disorders of brain: Secondary | ICD-10-CM | POA: Diagnosis not present

## 2017-04-17 DIAGNOSIS — Z87891 Personal history of nicotine dependence: Secondary | ICD-10-CM | POA: Diagnosis not present

## 2017-04-17 DIAGNOSIS — J449 Chronic obstructive pulmonary disease, unspecified: Secondary | ICD-10-CM | POA: Diagnosis not present

## 2017-04-21 DIAGNOSIS — M1990 Unspecified osteoarthritis, unspecified site: Secondary | ICD-10-CM | POA: Diagnosis not present

## 2017-04-21 DIAGNOSIS — R296 Repeated falls: Secondary | ICD-10-CM | POA: Diagnosis not present

## 2017-04-21 DIAGNOSIS — M9741XA Periprosthetic fracture around internal prosthetic right elbow joint, initial encounter: Secondary | ICD-10-CM | POA: Diagnosis not present

## 2017-04-21 DIAGNOSIS — G894 Chronic pain syndrome: Secondary | ICD-10-CM | POA: Diagnosis not present

## 2017-04-26 DIAGNOSIS — J449 Chronic obstructive pulmonary disease, unspecified: Secondary | ICD-10-CM | POA: Diagnosis not present

## 2017-04-28 DIAGNOSIS — R296 Repeated falls: Secondary | ICD-10-CM | POA: Diagnosis not present

## 2017-04-28 DIAGNOSIS — I10 Essential (primary) hypertension: Secondary | ICD-10-CM | POA: Diagnosis not present

## 2017-04-28 DIAGNOSIS — J449 Chronic obstructive pulmonary disease, unspecified: Secondary | ICD-10-CM | POA: Diagnosis not present

## 2017-04-28 DIAGNOSIS — S0101XD Laceration without foreign body of scalp, subsequent encounter: Secondary | ICD-10-CM | POA: Diagnosis not present

## 2017-04-28 DIAGNOSIS — K219 Gastro-esophageal reflux disease without esophagitis: Secondary | ICD-10-CM | POA: Diagnosis not present

## 2017-04-29 DIAGNOSIS — Z880 Allergy status to penicillin: Secondary | ICD-10-CM | POA: Diagnosis not present

## 2017-04-29 DIAGNOSIS — I10 Essential (primary) hypertension: Secondary | ICD-10-CM | POA: Diagnosis not present

## 2017-04-29 DIAGNOSIS — S0003XA Contusion of scalp, initial encounter: Secondary | ICD-10-CM | POA: Diagnosis not present

## 2017-04-29 DIAGNOSIS — M1612 Unilateral primary osteoarthritis, left hip: Secondary | ICD-10-CM | POA: Diagnosis not present

## 2017-04-29 DIAGNOSIS — Z79899 Other long term (current) drug therapy: Secondary | ICD-10-CM | POA: Diagnosis not present

## 2017-04-29 DIAGNOSIS — S79912A Unspecified injury of left hip, initial encounter: Secondary | ICD-10-CM | POA: Diagnosis not present

## 2017-04-29 DIAGNOSIS — M47896 Other spondylosis, lumbar region: Secondary | ICD-10-CM | POA: Diagnosis not present

## 2017-04-29 DIAGNOSIS — S0083XA Contusion of other part of head, initial encounter: Secondary | ICD-10-CM | POA: Diagnosis not present

## 2017-04-29 DIAGNOSIS — G319 Degenerative disease of nervous system, unspecified: Secondary | ICD-10-CM | POA: Diagnosis not present

## 2017-04-29 DIAGNOSIS — S0093XA Contusion of unspecified part of head, initial encounter: Secondary | ICD-10-CM | POA: Diagnosis not present

## 2017-04-29 DIAGNOSIS — I6782 Cerebral ischemia: Secondary | ICD-10-CM | POA: Diagnosis not present

## 2017-04-29 DIAGNOSIS — Z91012 Allergy to eggs: Secondary | ICD-10-CM | POA: Diagnosis not present

## 2017-04-29 DIAGNOSIS — Z881 Allergy status to other antibiotic agents status: Secondary | ICD-10-CM | POA: Diagnosis not present

## 2017-04-29 DIAGNOSIS — Z885 Allergy status to narcotic agent status: Secondary | ICD-10-CM | POA: Diagnosis not present

## 2017-04-29 DIAGNOSIS — W19XXXA Unspecified fall, initial encounter: Secondary | ICD-10-CM | POA: Diagnosis not present

## 2017-04-29 DIAGNOSIS — Z87891 Personal history of nicotine dependence: Secondary | ICD-10-CM | POA: Diagnosis not present

## 2017-04-29 DIAGNOSIS — J449 Chronic obstructive pulmonary disease, unspecified: Secondary | ICD-10-CM | POA: Diagnosis not present

## 2017-05-05 DIAGNOSIS — R296 Repeated falls: Secondary | ICD-10-CM | POA: Diagnosis not present

## 2017-05-19 DIAGNOSIS — R296 Repeated falls: Secondary | ICD-10-CM | POA: Diagnosis not present

## 2017-05-19 DIAGNOSIS — G894 Chronic pain syndrome: Secondary | ICD-10-CM | POA: Diagnosis not present

## 2017-05-19 DIAGNOSIS — M1990 Unspecified osteoarthritis, unspecified site: Secondary | ICD-10-CM | POA: Diagnosis not present

## 2017-05-19 DIAGNOSIS — M9741XA Periprosthetic fracture around internal prosthetic right elbow joint, initial encounter: Secondary | ICD-10-CM | POA: Diagnosis not present

## 2017-05-26 DIAGNOSIS — Z0000 Encounter for general adult medical examination without abnormal findings: Secondary | ICD-10-CM | POA: Diagnosis not present

## 2017-05-27 DIAGNOSIS — J449 Chronic obstructive pulmonary disease, unspecified: Secondary | ICD-10-CM | POA: Diagnosis not present

## 2017-06-09 DIAGNOSIS — M1612 Unilateral primary osteoarthritis, left hip: Secondary | ICD-10-CM | POA: Diagnosis not present

## 2017-06-09 DIAGNOSIS — L989 Disorder of the skin and subcutaneous tissue, unspecified: Secondary | ICD-10-CM | POA: Diagnosis not present

## 2017-06-09 DIAGNOSIS — I10 Essential (primary) hypertension: Secondary | ICD-10-CM | POA: Diagnosis not present

## 2017-06-09 DIAGNOSIS — Z9981 Dependence on supplemental oxygen: Secondary | ICD-10-CM | POA: Diagnosis not present

## 2017-06-09 DIAGNOSIS — Z9181 History of falling: Secondary | ICD-10-CM | POA: Diagnosis not present

## 2017-06-09 DIAGNOSIS — J449 Chronic obstructive pulmonary disease, unspecified: Secondary | ICD-10-CM | POA: Diagnosis not present

## 2017-06-09 DIAGNOSIS — Z87891 Personal history of nicotine dependence: Secondary | ICD-10-CM | POA: Diagnosis not present

## 2017-06-09 DIAGNOSIS — Z4800 Encounter for change or removal of nonsurgical wound dressing: Secondary | ICD-10-CM | POA: Diagnosis not present

## 2017-06-09 DIAGNOSIS — G309 Alzheimer's disease, unspecified: Secondary | ICD-10-CM | POA: Diagnosis not present

## 2017-06-10 DIAGNOSIS — L989 Disorder of the skin and subcutaneous tissue, unspecified: Secondary | ICD-10-CM | POA: Diagnosis not present

## 2017-06-10 DIAGNOSIS — J449 Chronic obstructive pulmonary disease, unspecified: Secondary | ICD-10-CM | POA: Diagnosis not present

## 2017-06-10 DIAGNOSIS — Z4800 Encounter for change or removal of nonsurgical wound dressing: Secondary | ICD-10-CM | POA: Diagnosis not present

## 2017-06-10 DIAGNOSIS — Z9181 History of falling: Secondary | ICD-10-CM | POA: Diagnosis not present

## 2017-06-10 DIAGNOSIS — I10 Essential (primary) hypertension: Secondary | ICD-10-CM | POA: Diagnosis not present

## 2017-06-10 DIAGNOSIS — M1612 Unilateral primary osteoarthritis, left hip: Secondary | ICD-10-CM | POA: Diagnosis not present

## 2017-06-10 DIAGNOSIS — G309 Alzheimer's disease, unspecified: Secondary | ICD-10-CM | POA: Diagnosis not present

## 2017-06-10 DIAGNOSIS — Z9981 Dependence on supplemental oxygen: Secondary | ICD-10-CM | POA: Diagnosis not present

## 2017-06-10 DIAGNOSIS — Z87891 Personal history of nicotine dependence: Secondary | ICD-10-CM | POA: Diagnosis not present

## 2017-06-13 DIAGNOSIS — Z9181 History of falling: Secondary | ICD-10-CM | POA: Diagnosis not present

## 2017-06-13 DIAGNOSIS — M1612 Unilateral primary osteoarthritis, left hip: Secondary | ICD-10-CM | POA: Diagnosis not present

## 2017-06-13 DIAGNOSIS — J449 Chronic obstructive pulmonary disease, unspecified: Secondary | ICD-10-CM | POA: Diagnosis not present

## 2017-06-13 DIAGNOSIS — I10 Essential (primary) hypertension: Secondary | ICD-10-CM | POA: Diagnosis not present

## 2017-06-13 DIAGNOSIS — G309 Alzheimer's disease, unspecified: Secondary | ICD-10-CM | POA: Diagnosis not present

## 2017-06-13 DIAGNOSIS — Z4800 Encounter for change or removal of nonsurgical wound dressing: Secondary | ICD-10-CM | POA: Diagnosis not present

## 2017-06-13 DIAGNOSIS — Z87891 Personal history of nicotine dependence: Secondary | ICD-10-CM | POA: Diagnosis not present

## 2017-06-13 DIAGNOSIS — L989 Disorder of the skin and subcutaneous tissue, unspecified: Secondary | ICD-10-CM | POA: Diagnosis not present

## 2017-06-13 DIAGNOSIS — Z9981 Dependence on supplemental oxygen: Secondary | ICD-10-CM | POA: Diagnosis not present

## 2017-06-14 DIAGNOSIS — M1612 Unilateral primary osteoarthritis, left hip: Secondary | ICD-10-CM | POA: Diagnosis not present

## 2017-06-14 DIAGNOSIS — Z87891 Personal history of nicotine dependence: Secondary | ICD-10-CM | POA: Diagnosis not present

## 2017-06-14 DIAGNOSIS — Z9981 Dependence on supplemental oxygen: Secondary | ICD-10-CM | POA: Diagnosis not present

## 2017-06-14 DIAGNOSIS — I10 Essential (primary) hypertension: Secondary | ICD-10-CM | POA: Diagnosis not present

## 2017-06-14 DIAGNOSIS — L989 Disorder of the skin and subcutaneous tissue, unspecified: Secondary | ICD-10-CM | POA: Diagnosis not present

## 2017-06-14 DIAGNOSIS — Z4800 Encounter for change or removal of nonsurgical wound dressing: Secondary | ICD-10-CM | POA: Diagnosis not present

## 2017-06-14 DIAGNOSIS — Z9181 History of falling: Secondary | ICD-10-CM | POA: Diagnosis not present

## 2017-06-14 DIAGNOSIS — J449 Chronic obstructive pulmonary disease, unspecified: Secondary | ICD-10-CM | POA: Diagnosis not present

## 2017-06-14 DIAGNOSIS — G309 Alzheimer's disease, unspecified: Secondary | ICD-10-CM | POA: Diagnosis not present

## 2017-06-15 DIAGNOSIS — I10 Essential (primary) hypertension: Secondary | ICD-10-CM | POA: Diagnosis not present

## 2017-06-15 DIAGNOSIS — M1612 Unilateral primary osteoarthritis, left hip: Secondary | ICD-10-CM | POA: Diagnosis not present

## 2017-06-15 DIAGNOSIS — Z87891 Personal history of nicotine dependence: Secondary | ICD-10-CM | POA: Diagnosis not present

## 2017-06-15 DIAGNOSIS — G309 Alzheimer's disease, unspecified: Secondary | ICD-10-CM | POA: Diagnosis not present

## 2017-06-15 DIAGNOSIS — Z9181 History of falling: Secondary | ICD-10-CM | POA: Diagnosis not present

## 2017-06-15 DIAGNOSIS — L989 Disorder of the skin and subcutaneous tissue, unspecified: Secondary | ICD-10-CM | POA: Diagnosis not present

## 2017-06-15 DIAGNOSIS — Z9981 Dependence on supplemental oxygen: Secondary | ICD-10-CM | POA: Diagnosis not present

## 2017-06-15 DIAGNOSIS — R296 Repeated falls: Secondary | ICD-10-CM | POA: Diagnosis not present

## 2017-06-15 DIAGNOSIS — Z4800 Encounter for change or removal of nonsurgical wound dressing: Secondary | ICD-10-CM | POA: Diagnosis not present

## 2017-06-15 DIAGNOSIS — J449 Chronic obstructive pulmonary disease, unspecified: Secondary | ICD-10-CM | POA: Diagnosis not present

## 2017-06-15 DIAGNOSIS — K219 Gastro-esophageal reflux disease without esophagitis: Secondary | ICD-10-CM | POA: Diagnosis not present

## 2017-06-15 DIAGNOSIS — S0101XD Laceration without foreign body of scalp, subsequent encounter: Secondary | ICD-10-CM | POA: Diagnosis not present

## 2017-06-16 DIAGNOSIS — G309 Alzheimer's disease, unspecified: Secondary | ICD-10-CM | POA: Diagnosis not present

## 2017-06-16 DIAGNOSIS — R296 Repeated falls: Secondary | ICD-10-CM | POA: Diagnosis not present

## 2017-06-16 DIAGNOSIS — Z87891 Personal history of nicotine dependence: Secondary | ICD-10-CM | POA: Diagnosis not present

## 2017-06-16 DIAGNOSIS — L989 Disorder of the skin and subcutaneous tissue, unspecified: Secondary | ICD-10-CM | POA: Diagnosis not present

## 2017-06-16 DIAGNOSIS — Z9981 Dependence on supplemental oxygen: Secondary | ICD-10-CM | POA: Diagnosis not present

## 2017-06-16 DIAGNOSIS — J449 Chronic obstructive pulmonary disease, unspecified: Secondary | ICD-10-CM | POA: Diagnosis not present

## 2017-06-16 DIAGNOSIS — Z4800 Encounter for change or removal of nonsurgical wound dressing: Secondary | ICD-10-CM | POA: Diagnosis not present

## 2017-06-16 DIAGNOSIS — I10 Essential (primary) hypertension: Secondary | ICD-10-CM | POA: Diagnosis not present

## 2017-06-16 DIAGNOSIS — M1612 Unilateral primary osteoarthritis, left hip: Secondary | ICD-10-CM | POA: Diagnosis not present

## 2017-06-16 DIAGNOSIS — Z9181 History of falling: Secondary | ICD-10-CM | POA: Diagnosis not present

## 2017-06-16 DIAGNOSIS — R2689 Other abnormalities of gait and mobility: Secondary | ICD-10-CM | POA: Diagnosis not present

## 2017-06-17 DIAGNOSIS — I10 Essential (primary) hypertension: Secondary | ICD-10-CM | POA: Diagnosis not present

## 2017-06-17 DIAGNOSIS — L989 Disorder of the skin and subcutaneous tissue, unspecified: Secondary | ICD-10-CM | POA: Diagnosis not present

## 2017-06-17 DIAGNOSIS — G309 Alzheimer's disease, unspecified: Secondary | ICD-10-CM | POA: Diagnosis not present

## 2017-06-17 DIAGNOSIS — J449 Chronic obstructive pulmonary disease, unspecified: Secondary | ICD-10-CM | POA: Diagnosis not present

## 2017-06-17 DIAGNOSIS — Z9981 Dependence on supplemental oxygen: Secondary | ICD-10-CM | POA: Diagnosis not present

## 2017-06-17 DIAGNOSIS — Z9181 History of falling: Secondary | ICD-10-CM | POA: Diagnosis not present

## 2017-06-17 DIAGNOSIS — Z87891 Personal history of nicotine dependence: Secondary | ICD-10-CM | POA: Diagnosis not present

## 2017-06-17 DIAGNOSIS — M1612 Unilateral primary osteoarthritis, left hip: Secondary | ICD-10-CM | POA: Diagnosis not present

## 2017-06-17 DIAGNOSIS — Z4800 Encounter for change or removal of nonsurgical wound dressing: Secondary | ICD-10-CM | POA: Diagnosis not present

## 2017-06-20 DIAGNOSIS — M1612 Unilateral primary osteoarthritis, left hip: Secondary | ICD-10-CM | POA: Diagnosis not present

## 2017-06-20 DIAGNOSIS — Z87891 Personal history of nicotine dependence: Secondary | ICD-10-CM | POA: Diagnosis not present

## 2017-06-20 DIAGNOSIS — Z9981 Dependence on supplemental oxygen: Secondary | ICD-10-CM | POA: Diagnosis not present

## 2017-06-20 DIAGNOSIS — J449 Chronic obstructive pulmonary disease, unspecified: Secondary | ICD-10-CM | POA: Diagnosis not present

## 2017-06-20 DIAGNOSIS — G309 Alzheimer's disease, unspecified: Secondary | ICD-10-CM | POA: Diagnosis not present

## 2017-06-20 DIAGNOSIS — Z9181 History of falling: Secondary | ICD-10-CM | POA: Diagnosis not present

## 2017-06-20 DIAGNOSIS — Z4800 Encounter for change or removal of nonsurgical wound dressing: Secondary | ICD-10-CM | POA: Diagnosis not present

## 2017-06-20 DIAGNOSIS — L989 Disorder of the skin and subcutaneous tissue, unspecified: Secondary | ICD-10-CM | POA: Diagnosis not present

## 2017-06-20 DIAGNOSIS — I10 Essential (primary) hypertension: Secondary | ICD-10-CM | POA: Diagnosis not present

## 2017-06-21 DIAGNOSIS — Z79899 Other long term (current) drug therapy: Secondary | ICD-10-CM | POA: Diagnosis not present

## 2017-06-22 DIAGNOSIS — Z87891 Personal history of nicotine dependence: Secondary | ICD-10-CM | POA: Diagnosis not present

## 2017-06-22 DIAGNOSIS — Z4800 Encounter for change or removal of nonsurgical wound dressing: Secondary | ICD-10-CM | POA: Diagnosis not present

## 2017-06-22 DIAGNOSIS — G309 Alzheimer's disease, unspecified: Secondary | ICD-10-CM | POA: Diagnosis not present

## 2017-06-22 DIAGNOSIS — Z9181 History of falling: Secondary | ICD-10-CM | POA: Diagnosis not present

## 2017-06-22 DIAGNOSIS — Z9981 Dependence on supplemental oxygen: Secondary | ICD-10-CM | POA: Diagnosis not present

## 2017-06-22 DIAGNOSIS — J449 Chronic obstructive pulmonary disease, unspecified: Secondary | ICD-10-CM | POA: Diagnosis not present

## 2017-06-22 DIAGNOSIS — L989 Disorder of the skin and subcutaneous tissue, unspecified: Secondary | ICD-10-CM | POA: Diagnosis not present

## 2017-06-22 DIAGNOSIS — M1612 Unilateral primary osteoarthritis, left hip: Secondary | ICD-10-CM | POA: Diagnosis not present

## 2017-06-22 DIAGNOSIS — I10 Essential (primary) hypertension: Secondary | ICD-10-CM | POA: Diagnosis not present

## 2017-06-23 DIAGNOSIS — G309 Alzheimer's disease, unspecified: Secondary | ICD-10-CM | POA: Diagnosis not present

## 2017-06-23 DIAGNOSIS — L989 Disorder of the skin and subcutaneous tissue, unspecified: Secondary | ICD-10-CM | POA: Diagnosis not present

## 2017-06-23 DIAGNOSIS — I10 Essential (primary) hypertension: Secondary | ICD-10-CM | POA: Diagnosis not present

## 2017-06-23 DIAGNOSIS — Z9181 History of falling: Secondary | ICD-10-CM | POA: Diagnosis not present

## 2017-06-23 DIAGNOSIS — J449 Chronic obstructive pulmonary disease, unspecified: Secondary | ICD-10-CM | POA: Diagnosis not present

## 2017-06-23 DIAGNOSIS — M1612 Unilateral primary osteoarthritis, left hip: Secondary | ICD-10-CM | POA: Diagnosis not present

## 2017-06-23 DIAGNOSIS — Z87891 Personal history of nicotine dependence: Secondary | ICD-10-CM | POA: Diagnosis not present

## 2017-06-23 DIAGNOSIS — Z4800 Encounter for change or removal of nonsurgical wound dressing: Secondary | ICD-10-CM | POA: Diagnosis not present

## 2017-06-23 DIAGNOSIS — Z9981 Dependence on supplemental oxygen: Secondary | ICD-10-CM | POA: Diagnosis not present

## 2017-06-24 DIAGNOSIS — Z4800 Encounter for change or removal of nonsurgical wound dressing: Secondary | ICD-10-CM | POA: Diagnosis not present

## 2017-06-24 DIAGNOSIS — I10 Essential (primary) hypertension: Secondary | ICD-10-CM | POA: Diagnosis not present

## 2017-06-24 DIAGNOSIS — J449 Chronic obstructive pulmonary disease, unspecified: Secondary | ICD-10-CM | POA: Diagnosis not present

## 2017-06-24 DIAGNOSIS — G309 Alzheimer's disease, unspecified: Secondary | ICD-10-CM | POA: Diagnosis not present

## 2017-06-24 DIAGNOSIS — Z9181 History of falling: Secondary | ICD-10-CM | POA: Diagnosis not present

## 2017-06-24 DIAGNOSIS — Z9981 Dependence on supplemental oxygen: Secondary | ICD-10-CM | POA: Diagnosis not present

## 2017-06-24 DIAGNOSIS — M1612 Unilateral primary osteoarthritis, left hip: Secondary | ICD-10-CM | POA: Diagnosis not present

## 2017-06-24 DIAGNOSIS — L989 Disorder of the skin and subcutaneous tissue, unspecified: Secondary | ICD-10-CM | POA: Diagnosis not present

## 2017-06-24 DIAGNOSIS — Z87891 Personal history of nicotine dependence: Secondary | ICD-10-CM | POA: Diagnosis not present

## 2017-06-27 DIAGNOSIS — Z4800 Encounter for change or removal of nonsurgical wound dressing: Secondary | ICD-10-CM | POA: Diagnosis not present

## 2017-06-27 DIAGNOSIS — Z9181 History of falling: Secondary | ICD-10-CM | POA: Diagnosis not present

## 2017-06-27 DIAGNOSIS — M1612 Unilateral primary osteoarthritis, left hip: Secondary | ICD-10-CM | POA: Diagnosis not present

## 2017-06-27 DIAGNOSIS — J449 Chronic obstructive pulmonary disease, unspecified: Secondary | ICD-10-CM | POA: Diagnosis not present

## 2017-06-27 DIAGNOSIS — Z87891 Personal history of nicotine dependence: Secondary | ICD-10-CM | POA: Diagnosis not present

## 2017-06-27 DIAGNOSIS — Z9981 Dependence on supplemental oxygen: Secondary | ICD-10-CM | POA: Diagnosis not present

## 2017-06-27 DIAGNOSIS — I10 Essential (primary) hypertension: Secondary | ICD-10-CM | POA: Diagnosis not present

## 2017-06-27 DIAGNOSIS — L989 Disorder of the skin and subcutaneous tissue, unspecified: Secondary | ICD-10-CM | POA: Diagnosis not present

## 2017-06-27 DIAGNOSIS — G309 Alzheimer's disease, unspecified: Secondary | ICD-10-CM | POA: Diagnosis not present

## 2017-06-28 DIAGNOSIS — Z4800 Encounter for change or removal of nonsurgical wound dressing: Secondary | ICD-10-CM | POA: Diagnosis not present

## 2017-06-28 DIAGNOSIS — Z9981 Dependence on supplemental oxygen: Secondary | ICD-10-CM | POA: Diagnosis not present

## 2017-06-28 DIAGNOSIS — Z87891 Personal history of nicotine dependence: Secondary | ICD-10-CM | POA: Diagnosis not present

## 2017-06-28 DIAGNOSIS — I10 Essential (primary) hypertension: Secondary | ICD-10-CM | POA: Diagnosis not present

## 2017-06-28 DIAGNOSIS — J449 Chronic obstructive pulmonary disease, unspecified: Secondary | ICD-10-CM | POA: Diagnosis not present

## 2017-06-28 DIAGNOSIS — G309 Alzheimer's disease, unspecified: Secondary | ICD-10-CM | POA: Diagnosis not present

## 2017-06-28 DIAGNOSIS — M1612 Unilateral primary osteoarthritis, left hip: Secondary | ICD-10-CM | POA: Diagnosis not present

## 2017-06-28 DIAGNOSIS — L989 Disorder of the skin and subcutaneous tissue, unspecified: Secondary | ICD-10-CM | POA: Diagnosis not present

## 2017-06-28 DIAGNOSIS — Z9181 History of falling: Secondary | ICD-10-CM | POA: Diagnosis not present

## 2017-06-29 DIAGNOSIS — L989 Disorder of the skin and subcutaneous tissue, unspecified: Secondary | ICD-10-CM | POA: Diagnosis not present

## 2017-06-29 DIAGNOSIS — M1612 Unilateral primary osteoarthritis, left hip: Secondary | ICD-10-CM | POA: Diagnosis not present

## 2017-06-29 DIAGNOSIS — Z9981 Dependence on supplemental oxygen: Secondary | ICD-10-CM | POA: Diagnosis not present

## 2017-06-29 DIAGNOSIS — Z87891 Personal history of nicotine dependence: Secondary | ICD-10-CM | POA: Diagnosis not present

## 2017-06-29 DIAGNOSIS — Z9181 History of falling: Secondary | ICD-10-CM | POA: Diagnosis not present

## 2017-06-29 DIAGNOSIS — I10 Essential (primary) hypertension: Secondary | ICD-10-CM | POA: Diagnosis not present

## 2017-06-29 DIAGNOSIS — J449 Chronic obstructive pulmonary disease, unspecified: Secondary | ICD-10-CM | POA: Diagnosis not present

## 2017-06-29 DIAGNOSIS — G309 Alzheimer's disease, unspecified: Secondary | ICD-10-CM | POA: Diagnosis not present

## 2017-06-29 DIAGNOSIS — Z4800 Encounter for change or removal of nonsurgical wound dressing: Secondary | ICD-10-CM | POA: Diagnosis not present

## 2017-06-30 ENCOUNTER — Telehealth: Payer: Self-pay | Admitting: Family Medicine

## 2017-06-30 DIAGNOSIS — Z4800 Encounter for change or removal of nonsurgical wound dressing: Secondary | ICD-10-CM | POA: Diagnosis not present

## 2017-06-30 DIAGNOSIS — J449 Chronic obstructive pulmonary disease, unspecified: Secondary | ICD-10-CM | POA: Diagnosis not present

## 2017-06-30 DIAGNOSIS — L989 Disorder of the skin and subcutaneous tissue, unspecified: Secondary | ICD-10-CM | POA: Diagnosis not present

## 2017-06-30 DIAGNOSIS — Z9981 Dependence on supplemental oxygen: Secondary | ICD-10-CM | POA: Diagnosis not present

## 2017-06-30 DIAGNOSIS — Z87891 Personal history of nicotine dependence: Secondary | ICD-10-CM | POA: Diagnosis not present

## 2017-06-30 DIAGNOSIS — I10 Essential (primary) hypertension: Secondary | ICD-10-CM | POA: Diagnosis not present

## 2017-06-30 DIAGNOSIS — Z9181 History of falling: Secondary | ICD-10-CM | POA: Diagnosis not present

## 2017-06-30 DIAGNOSIS — G309 Alzheimer's disease, unspecified: Secondary | ICD-10-CM | POA: Diagnosis not present

## 2017-06-30 DIAGNOSIS — M1612 Unilateral primary osteoarthritis, left hip: Secondary | ICD-10-CM | POA: Diagnosis not present

## 2017-06-30 NOTE — Telephone Encounter (Unsigned)
Copied from [place]. Topic: Inquiry >> Jun 30, 2017  2:19 PM Neva Seat wrote: Vicente Males [facility] - [phone] Asking if pt has had Dexa Scan.  Please call back to discuss care for pt.

## 2017-07-01 DIAGNOSIS — J449 Chronic obstructive pulmonary disease, unspecified: Secondary | ICD-10-CM | POA: Diagnosis not present

## 2017-07-01 DIAGNOSIS — M1612 Unilateral primary osteoarthritis, left hip: Secondary | ICD-10-CM | POA: Diagnosis not present

## 2017-07-01 DIAGNOSIS — Z4800 Encounter for change or removal of nonsurgical wound dressing: Secondary | ICD-10-CM | POA: Diagnosis not present

## 2017-07-01 DIAGNOSIS — L989 Disorder of the skin and subcutaneous tissue, unspecified: Secondary | ICD-10-CM | POA: Diagnosis not present

## 2017-07-01 DIAGNOSIS — Z9981 Dependence on supplemental oxygen: Secondary | ICD-10-CM | POA: Diagnosis not present

## 2017-07-01 DIAGNOSIS — G309 Alzheimer's disease, unspecified: Secondary | ICD-10-CM | POA: Diagnosis not present

## 2017-07-01 DIAGNOSIS — Z87891 Personal history of nicotine dependence: Secondary | ICD-10-CM | POA: Diagnosis not present

## 2017-07-01 DIAGNOSIS — I10 Essential (primary) hypertension: Secondary | ICD-10-CM | POA: Diagnosis not present

## 2017-07-01 DIAGNOSIS — Z9181 History of falling: Secondary | ICD-10-CM | POA: Diagnosis not present

## 2017-07-11 NOTE — Telephone Encounter (Signed)
Looked in patients chart I do not see a DEXA scan in her chart

## 2017-07-22 DEATH — deceased
